# Patient Record
Sex: Female | Born: 1940 | Race: White | Hispanic: No | Smoking: Never smoker
Health system: Southern US, Community
[De-identification: ages and names within clinical notes are randomized; demographics above are authoritative.]

## PROBLEM LIST (undated history)

## (undated) DIAGNOSIS — I38 Endocarditis, valve unspecified: Secondary | ICD-10-CM

## (undated) DIAGNOSIS — E161 Other hypoglycemia: Secondary | ICD-10-CM

## (undated) DIAGNOSIS — M1990 Unspecified osteoarthritis, unspecified site: Secondary | ICD-10-CM

## (undated) DIAGNOSIS — E079 Disorder of thyroid, unspecified: Secondary | ICD-10-CM

## (undated) DIAGNOSIS — M8580 Other specified disorders of bone density and structure, unspecified site: Secondary | ICD-10-CM

## (undated) DIAGNOSIS — F419 Anxiety disorder, unspecified: Secondary | ICD-10-CM

## (undated) HISTORY — PX: COLONOSCOPY: SHX174

## (undated) HISTORY — DX: Other specified disorders of bone density and structure, unspecified site: M85.80

## (undated) HISTORY — DX: Anxiety disorder, unspecified: F41.9

## (undated) HISTORY — PX: BIOPSY BREAST: PRO8

## (undated) HISTORY — PX: UPPER GASTROINTESTINAL ENDOSCOPY: SHX188

## (undated) HISTORY — PX: OOPHORECTOMY: SHX86

---

## 1998-07-05 ENCOUNTER — Other Ambulatory Visit: Admission: RE | Admit: 1998-07-05 | Discharge: 1998-07-05 | Payer: Self-pay | Admitting: Obstetrics and Gynecology

## 1999-06-20 ENCOUNTER — Other Ambulatory Visit: Admission: RE | Admit: 1999-06-20 | Discharge: 1999-06-20 | Payer: Self-pay | Admitting: Obstetrics and Gynecology

## 2000-06-03 ENCOUNTER — Other Ambulatory Visit: Admission: RE | Admit: 2000-06-03 | Discharge: 2000-06-03 | Payer: Self-pay | Admitting: Obstetrics and Gynecology

## 2001-06-05 ENCOUNTER — Other Ambulatory Visit: Admission: RE | Admit: 2001-06-05 | Discharge: 2001-06-05 | Payer: Self-pay | Admitting: Obstetrics and Gynecology

## 2002-07-07 ENCOUNTER — Other Ambulatory Visit: Admission: RE | Admit: 2002-07-07 | Discharge: 2002-07-07 | Payer: Self-pay | Admitting: Obstetrics and Gynecology

## 2002-08-10 ENCOUNTER — Encounter: Admission: RE | Admit: 2002-08-10 | Discharge: 2002-08-10 | Payer: Self-pay | Admitting: *Deleted

## 2003-07-20 ENCOUNTER — Other Ambulatory Visit: Admission: RE | Admit: 2003-07-20 | Discharge: 2003-07-20 | Payer: Self-pay | Admitting: Obstetrics and Gynecology

## 2007-10-22 ENCOUNTER — Emergency Department (HOSPITAL_COMMUNITY): Admission: EM | Admit: 2007-10-22 | Discharge: 2007-10-22 | Payer: Self-pay | Admitting: Emergency Medicine

## 2007-12-16 ENCOUNTER — Ambulatory Visit: Payer: Self-pay | Admitting: Sports Medicine

## 2007-12-16 ENCOUNTER — Telehealth (INDEPENDENT_AMBULATORY_CARE_PROVIDER_SITE_OTHER): Payer: Self-pay | Admitting: *Deleted

## 2007-12-16 DIAGNOSIS — M21619 Bunion of unspecified foot: Secondary | ICD-10-CM

## 2007-12-16 DIAGNOSIS — M79609 Pain in unspecified limb: Secondary | ICD-10-CM

## 2008-04-19 ENCOUNTER — Telehealth (INDEPENDENT_AMBULATORY_CARE_PROVIDER_SITE_OTHER): Payer: Self-pay | Admitting: *Deleted

## 2008-04-20 ENCOUNTER — Ambulatory Visit: Payer: Self-pay | Admitting: Sports Medicine

## 2008-04-20 DIAGNOSIS — M5126 Other intervertebral disc displacement, lumbar region: Secondary | ICD-10-CM

## 2008-04-27 ENCOUNTER — Telehealth: Payer: Self-pay | Admitting: *Deleted

## 2008-04-27 ENCOUNTER — Ambulatory Visit: Payer: Self-pay | Admitting: Family Medicine

## 2008-05-03 ENCOUNTER — Telehealth: Payer: Self-pay | Admitting: Sports Medicine

## 2008-05-06 ENCOUNTER — Telehealth (INDEPENDENT_AMBULATORY_CARE_PROVIDER_SITE_OTHER): Payer: Self-pay | Admitting: *Deleted

## 2008-05-07 ENCOUNTER — Encounter: Admission: RE | Admit: 2008-05-07 | Discharge: 2008-05-07 | Payer: Self-pay | Admitting: Sports Medicine

## 2008-05-10 ENCOUNTER — Telehealth: Payer: Self-pay | Admitting: Sports Medicine

## 2008-05-12 ENCOUNTER — Ambulatory Visit: Payer: Self-pay | Admitting: Family Medicine

## 2008-05-12 DIAGNOSIS — M479 Spondylosis, unspecified: Secondary | ICD-10-CM | POA: Insufficient documentation

## 2008-05-12 DIAGNOSIS — M5137 Other intervertebral disc degeneration, lumbosacral region: Secondary | ICD-10-CM

## 2008-05-12 DIAGNOSIS — M545 Low back pain: Secondary | ICD-10-CM

## 2008-05-14 ENCOUNTER — Telehealth: Payer: Self-pay | Admitting: *Deleted

## 2008-05-17 ENCOUNTER — Ambulatory Visit: Payer: Self-pay | Admitting: Family Medicine

## 2008-05-17 DIAGNOSIS — K5900 Constipation, unspecified: Secondary | ICD-10-CM | POA: Insufficient documentation

## 2008-05-20 ENCOUNTER — Ambulatory Visit: Payer: Self-pay | Admitting: Family Medicine

## 2008-05-20 ENCOUNTER — Telehealth: Payer: Self-pay | Admitting: *Deleted

## 2008-05-21 ENCOUNTER — Telehealth: Payer: Self-pay | Admitting: Family Medicine

## 2008-05-24 ENCOUNTER — Telehealth: Payer: Self-pay | Admitting: Sports Medicine

## 2008-05-25 ENCOUNTER — Encounter (INDEPENDENT_AMBULATORY_CARE_PROVIDER_SITE_OTHER): Payer: Self-pay | Admitting: *Deleted

## 2008-05-26 ENCOUNTER — Telehealth (INDEPENDENT_AMBULATORY_CARE_PROVIDER_SITE_OTHER): Payer: Self-pay | Admitting: *Deleted

## 2008-05-28 ENCOUNTER — Encounter: Admission: RE | Admit: 2008-05-28 | Discharge: 2008-05-28 | Payer: Self-pay | Admitting: Sports Medicine

## 2008-06-02 ENCOUNTER — Telehealth: Payer: Self-pay | Admitting: Sports Medicine

## 2008-06-02 ENCOUNTER — Ambulatory Visit: Payer: Self-pay | Admitting: Sports Medicine

## 2008-06-23 ENCOUNTER — Ambulatory Visit: Payer: Self-pay | Admitting: Sports Medicine

## 2008-06-23 ENCOUNTER — Telehealth (INDEPENDENT_AMBULATORY_CARE_PROVIDER_SITE_OTHER): Payer: Self-pay | Admitting: *Deleted

## 2008-07-23 ENCOUNTER — Ambulatory Visit: Payer: Self-pay | Admitting: Sports Medicine

## 2008-07-23 ENCOUNTER — Ambulatory Visit: Payer: Self-pay | Admitting: Family Medicine

## 2009-09-25 ENCOUNTER — Inpatient Hospital Stay (HOSPITAL_COMMUNITY): Admission: EM | Admit: 2009-09-25 | Discharge: 2009-09-25 | Payer: Self-pay | Admitting: Emergency Medicine

## 2009-10-20 ENCOUNTER — Ambulatory Visit (HOSPITAL_COMMUNITY): Admission: RE | Admit: 2009-10-20 | Discharge: 2009-10-20 | Payer: Self-pay | Admitting: Internal Medicine

## 2009-10-24 ENCOUNTER — Encounter (HOSPITAL_COMMUNITY): Admission: RE | Admit: 2009-10-24 | Discharge: 2009-12-26 | Payer: Self-pay | Admitting: Internal Medicine

## 2009-12-28 ENCOUNTER — Ambulatory Visit: Payer: Self-pay | Admitting: "Endocrinology

## 2010-06-27 ENCOUNTER — Ambulatory Visit: Payer: Self-pay | Admitting: "Endocrinology

## 2010-10-05 ENCOUNTER — Ambulatory Visit: Payer: Self-pay | Admitting: "Endocrinology

## 2010-12-21 ENCOUNTER — Ambulatory Visit: Payer: Self-pay | Admitting: "Endocrinology

## 2011-01-17 ENCOUNTER — Ambulatory Visit (INDEPENDENT_AMBULATORY_CARE_PROVIDER_SITE_OTHER): Payer: Medicare Other | Admitting: "Endocrinology

## 2011-01-17 DIAGNOSIS — R1013 Epigastric pain: Secondary | ICD-10-CM

## 2011-01-17 DIAGNOSIS — G1221 Amyotrophic lateral sclerosis: Secondary | ICD-10-CM

## 2011-01-17 DIAGNOSIS — E0500 Thyrotoxicosis with diffuse goiter without thyrotoxic crisis or storm: Secondary | ICD-10-CM

## 2011-01-17 DIAGNOSIS — E890 Postprocedural hypothyroidism: Secondary | ICD-10-CM

## 2011-01-23 LAB — BASIC METABOLIC PANEL
BUN: 14 mg/dL (ref 6–23)
Calcium: 9.4 mg/dL (ref 8.4–10.5)
GFR calc non Af Amer: >60 mL/min (ref >60)
Potassium: 3.7 mEq/L (ref 3.5–5.1)

## 2011-01-23 LAB — POCT CARDIAC MARKERS: Myoglobin, poc: 24.9 ng/mL (ref 12–200)

## 2011-01-23 LAB — TSH: TSH: 0.017 u[IU]/mL — ABNORMAL LOW (ref 0.350–4.500)

## 2011-01-23 LAB — CBC
HCT: 39.3 % (ref 36.0–46.0)
Hemoglobin: 13.6 g/dL (ref 12.0–15.0)
MCHC: 34.6 g/dL (ref 30.0–36.0)
MCV: 99.1 fL (ref 78.0–100.0)

## 2011-01-23 LAB — DIFFERENTIAL
Eosinophils Relative: 1 % (ref 0–5)
Monocytes Absolute: 0.8 10*3/uL (ref 0.1–1.0)

## 2011-01-23 LAB — CARDIAC PANEL(CRET KIN+CKTOT+MB+TROPI)
Relative Index: INVALID (ref 0.0–2.5)
Troponin I: 0.01 ng/mL (ref 0.00–0.06)

## 2011-01-23 LAB — LIPID PANEL: HDL: 51 mg/dL (ref >39)

## 2011-01-23 LAB — ALT: ALT: 21 U/L (ref 0–35)

## 2011-02-01 ENCOUNTER — Encounter: Payer: Self-pay | Admitting: Sports Medicine

## 2011-02-01 ENCOUNTER — Ambulatory Visit (INDEPENDENT_AMBULATORY_CARE_PROVIDER_SITE_OTHER): Payer: Medicare Other | Admitting: Sports Medicine

## 2011-02-01 DIAGNOSIS — M5412 Radiculopathy, cervical region: Secondary | ICD-10-CM

## 2011-02-01 DIAGNOSIS — M5410 Radiculopathy, site unspecified: Secondary | ICD-10-CM | POA: Insufficient documentation

## 2011-02-01 DIAGNOSIS — M79601 Pain in right arm: Secondary | ICD-10-CM | POA: Insufficient documentation

## 2011-02-01 DIAGNOSIS — M79609 Pain in unspecified limb: Secondary | ICD-10-CM

## 2011-02-01 MED ORDER — AMITRIPTYLINE HCL 10 MG PO TABS: 10.0000 mg | ORAL_TABLET | Freq: Every day | ORAL | Status: DC

## 2011-02-01 NOTE — Patient Instructions (Signed)
You have a stretch neuropraxia of the ulnar nerve starting at level of triceps muscle probably caused by yoga Easy triceps work may help get rid of any muscle spasm- use 1 lb weight or bottle do 3 sets of 10 Do overhead and kickback positions  Start Vitamin B6 100 mg daily Use heating pad or warm compress to wrap right upper arm three times per day for 10 minutes Try something containing menthol (Bengay or flexall 454) on right upper arm  Ok to do yoga, but do not do positions where you support your full body weight on arms/hands Try to avoid letting your right arm dangle

## 2011-02-01 NOTE — Assessment & Plan Note (Signed)
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  I recommended vitamin B6. Some easy triceps exercises. Trial of amitriptyline 10-20 mg at nighttime which should also help since she has a poor sleep pattern.  She was advised regarding side effects but can continue to use the Klonopin as long as this is not too sedating in combination. I would like to recheck this in about 3 weeks. If not responding she could be a candidate for gabapentin.

## 2011-02-01 NOTE — Progress Notes (Signed)
  Subjective:    Patient ID: Glenda Perez, female    DOB: 10-12-1941, 70 y.o.   MRN: [id number]  HPI  Pt presents to clinic for evaluation of rt upper arm pain and rt hand tingling.  She describes the upper arm pain as tenderness that she is aware of all the time.  She does not recall any injury.  Does participate in yoga 4-5 times per week.  Rt hand started tingling 1 month ago when she was working on her taxes and holding a pencil tightly.  Fingers 3-5 tingle the most on rt hand.  Tingling worse when hand is dangling.  No weakness.  Hx of carpal tunnel which was treated with braces.  Has started sleeping in carpal tunnel braces again.  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 Has elevated Free thyroxine index since last visit, started on synthroid.    Review of Systems Patient has a history of herniated lumbar disc with radiculopathy which did resolve with conservative care.    Objective:   Physical Exam     Full ROM of neck, some increase in pain with lateral bend to right Slight pain in right arm with compression testing of neck to the left No weakness C5-T1 testing Normal biceps reflex good bilat Triceps reflex decreased bilat Protraction of rt scapula and slight winging Not much change with abduction Compression at Erb's point does not create Tinel's   Mild tenderness on compression of the triceps muscle. Negative Tinel's at the elbow and at the wrist. Phalen's test negative. Increased sensitivity over the fourth and fifth fingers of the right hand.    Assessment & Plan:

## 2011-02-19 ENCOUNTER — Ambulatory Visit (INDEPENDENT_AMBULATORY_CARE_PROVIDER_SITE_OTHER): Payer: Medicare Other | Admitting: Sports Medicine

## 2011-02-19 ENCOUNTER — Encounter: Payer: Self-pay | Admitting: Sports Medicine

## 2011-02-19 VITALS — BP 129/82

## 2011-02-19 DIAGNOSIS — M79601 Pain in right arm: Secondary | ICD-10-CM

## 2011-02-19 DIAGNOSIS — M5412 Radiculopathy, cervical region: Secondary | ICD-10-CM

## 2011-02-19 DIAGNOSIS — M5410 Radiculopathy, site unspecified: Secondary | ICD-10-CM

## 2011-02-19 DIAGNOSIS — M79609 Pain in unspecified limb: Secondary | ICD-10-CM

## 2011-02-19 NOTE — Progress Notes (Signed)
  Subjective:    Patient ID: Glenda Perez, female    DOB: 02-02-1941, 70 y.o.   MRN: [id number]  HPI  Pt presents to clinic for f/u of rt upper arm pain and tingling which she feels is improving.  States she has decreased tingling sensation, much less tenderness.  Continuing yoga- without significant problems.  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  She has not had prior neck problems but has had some lumbar disc issues with sciatica in the past. She does have a history of carpal tunnel, right but this seems different.   Review of Systems     Objective:   Physical Exam    no acute distress  Ocular changes consistent with graves disease  All strength tests normal c5-t1 Excellent strength on ER, IR, empty can, abduction Neck ROM normal Slightly tighter on lateral neck bend to rt more than lt   No scapular winging with abduction and adduction Slight decrease triceps jerk on rt  Rt triceps much less tender today Augmented phalen test neg    Assessment & Plan:

## 2011-02-19 NOTE — Assessment & Plan Note (Signed)
This is much less than the last visit. I do not detect any spasm over the triceps or the trapezius today. I want her to lessen her use of the sling. She should keep up the triceps exercises and do some range of motion exercises to loosen the trapezius.  Our plan to recheck in about 6 weeks

## 2011-02-19 NOTE — Assessment & Plan Note (Signed)
This seems to be resolving fairly quickly. With this in mind I kept her on vitamin B6. I would also like her to continue the amitriptyline for at least 4 more weeks. She had numerous questions and she plans to travel. I answered these but encouraged her to return to more normal activity particularly with the use of the arm. Her primary limitation is to avoid any motions that are creating a significant increase in pain down the back of the right arm. Otherwise I am okay with her doing yoga poses and walking and other exercises that we arm heals.

## 2011-02-19 NOTE — Patient Instructions (Addendum)
Continue on amitriptyline for the next month If you are going to take [place] on your trip, skip the amitrptyline Try moving your keyboard to your lap or put a pillow in your seat when you are having arm pain and tingling while using the computer Ok to continue any yoga positions that do not aggravate your symptoms Take therma wraps with you on your trip to [place] and [place] - use these at night Start easily swinging your right arm when you are walking on the treadmill -since you are seeing improvement now

## 2011-04-02 ENCOUNTER — Encounter: Payer: Self-pay | Admitting: Pediatrics

## 2011-04-02 DIAGNOSIS — E0500 Thyrotoxicosis with diffuse goiter without thyrotoxic crisis or storm: Secondary | ICD-10-CM

## 2011-04-03 ENCOUNTER — Ambulatory Visit (INDEPENDENT_AMBULATORY_CARE_PROVIDER_SITE_OTHER): Payer: Medicare Other | Admitting: Family Medicine

## 2011-04-03 ENCOUNTER — Ambulatory Visit
Admission: RE | Admit: 2011-04-03 | Discharge: 2011-04-03 | Disposition: A | Discharge status: Home / Self Care | Payer: Medicare Other | Source: Ambulatory Visit | Attending: Family Medicine | Admitting: Family Medicine

## 2011-04-03 ENCOUNTER — Encounter: Payer: Self-pay | Admitting: Family Medicine

## 2011-04-03 VITALS — BP 118/73

## 2011-04-03 DIAGNOSIS — M5410 Radiculopathy, site unspecified: Secondary | ICD-10-CM

## 2011-04-03 DIAGNOSIS — M79601 Pain in right arm: Secondary | ICD-10-CM

## 2011-04-03 DIAGNOSIS — M5412 Radiculopathy, cervical region: Secondary | ICD-10-CM

## 2011-04-03 DIAGNOSIS — M79609 Pain in unspecified limb: Secondary | ICD-10-CM

## 2011-04-03 NOTE — Progress Notes (Signed)
  Subjective:    Patient ID: Glenda Perez, female    DOB: 1941/02/11, 70 y.o.   MRN: [id number]  HPI 70 year old right-hand-dominant female to office for followup of right upper extremity radiculopathy. Symptoms are relatively unchanged since last visit, he continues to have tingling into her hand, but now is experiencing increased pain along her right shoulder and into her right trapezius. She has been taking amitriptyline at bedtime along with vitamin B6 and has been tolerating these well. Is sleeping well at night. Denies any upper extremity weakness. Was on a recent trip to [place] and was having increasing pain in her upper arm as well as increasing numbness over the dorsum of her right hand. She was evaluated by a physical therapist on the triple he found no focal neurologic findings. She has been continuing to avoid yoga. No previous neck imaging or nerve conduction studies for this problem.  She has not had prior neck problems but has had some lumbar disc issues with sciatica in the past. She does have a history of carpal tunnel, right but this seems different.   Review of Systems Per history of present illness, otherwise negative    Objective:   Physical Exam GENERAL: alert and oriented x3, in no acute distress, pleasant SKIN: No rashes or lesions MSK: - C-spine: Good range of motion in all planes , does have increased pain into her right shoulder with rightward sidebending and rotation. Spurling's on right does reproduce pain into her right shoulder, no radicular symptoms down her arm, negative Spurling's on the left. - Shoulders: Full range of motion bilaterally without pain, normal rotator cuff strength, negative impingement testing. - Elbows: Full range of motion bilaterally without pain. No tenderness over medial or lateral epicondyles or over her olecranon. Negative Tinel's and cubital, bilaterally. - Forearm/hand/wrist: Normal range of motion without pain. Normal grip  strength and normal intrinsic hand strength. Negative Tinel's over the carpal tunnel bilaterally. Negative augmented Phalen's bilaterally. NEURO: Sensation intact to light touch upper tremor noted bilaterally. DTR +1/4 right tricep, otherwise +2/4 left tricep and biceps, brachial radialis bilaterally. Normal strength C5-T1 bilaterally. VASCULAR: Pulses +2/4 radial artery bilaterally.      Assessment & Plan:

## 2011-04-03 NOTE — Assessment & Plan Note (Signed)
Right arm pain has progressed since last visit now having increased pain over the right shoulder and right triceps concerning for possible cervical radiculopathy. No spasm appreciated on exam today. - Continue amitriptyline and B6 as stated above. - Check cervical spine x-rays, call with results and discuss further treatment options at that time.

## 2011-04-03 NOTE — Assessment & Plan Note (Signed)
Right upper extremity radiculopathy with increasing pain in her right shoulder and right tricep. Symptoms relatively unchanged from previous evaluation. With increasing symptoms in her shoulder and tricep question whether nerve impingement is coming from the neck versus more distal in the arm. - Will check cervical spine films to evaluate for arthritis. - Discussed increasing amitriptyline dose, but patient was not interested. Should continue with this at current dose. - Continue with vitamin B6 as she is doing. - Will call patient with x-ray results at [phone](H) or [phone](C). Based on x-ray findings may consider MRI of cervical spine versus nerve conduction studies to further delineate cause of her symptoms. We'll discuss followup at that time.

## 2011-04-05 ENCOUNTER — Telehealth: Payer: Self-pay | Admitting: Family Medicine

## 2011-04-05 MED ORDER — PREDNISONE (PAK) 10 MG PO TABS: ORAL_TABLET | ORAL | Status: DC

## 2011-04-05 NOTE — Telephone Encounter (Signed)
Call patient 04/05/11 at 14:05 to discuss recent cervical spine x-rays. No answer on her home phone, cell phone was called and message left indicating x-ray showing significant arthritis. Would like to try to discuss findings with her so we can explore further treatment options. I will try to reach her later tomorrow.  Patient called back, spoke to her on the phone at 14:30. 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 Discussed possible treatment options and patient would like to do give trial of prednisone Dosepak. She was not interested in increasing her amitriptyline at this time. Discussed possibility of MRI in the future, but she would like to wait and discuss this in the future. She will be traveling to [place] next week and plans to start prednisone after that. She will followup with [place] one to 2 weeks after completing prednisone and assess her progress. She will call with any questions or concerns in the interim.

## 2011-04-18 ENCOUNTER — Other Ambulatory Visit: Payer: Self-pay | Admitting: *Deleted

## 2011-04-18 DIAGNOSIS — E0500 Thyrotoxicosis with diffuse goiter without thyrotoxic crisis or storm: Secondary | ICD-10-CM

## 2011-04-23 ENCOUNTER — Other Ambulatory Visit: Payer: Self-pay | Admitting: "Endocrinology

## 2011-04-24 LAB — T3, FREE: T3, Free: 2.2 pg/mL — ABNORMAL LOW (ref 2.3–4.2)

## 2011-04-24 LAB — TSH: TSH: 1.736 u[IU]/mL (ref 0.350–4.500)

## 2011-04-26 ENCOUNTER — Ambulatory Visit (INDEPENDENT_AMBULATORY_CARE_PROVIDER_SITE_OTHER): Payer: Medicare Other | Admitting: Sports Medicine

## 2011-04-26 VITALS — BP 105/67

## 2011-04-26 DIAGNOSIS — M5412 Radiculopathy, cervical region: Secondary | ICD-10-CM

## 2011-04-26 DIAGNOSIS — M5410 Radiculopathy, site unspecified: Secondary | ICD-10-CM

## 2011-04-26 DIAGNOSIS — M47812 Spondylosis without myelopathy or radiculopathy, cervical region: Secondary | ICD-10-CM | POA: Insufficient documentation

## 2011-04-26 LAB — THYROID STIMULATING IMMUNOGLOBULIN: TSI: 347 % baseline — ABNORMAL HIGH (ref <140)

## 2011-04-26 NOTE — Assessment & Plan Note (Signed)
Plan the patient has significant degenerative disc disease of the neck it is not clear that this actually triggered her radiculopathy although I think it may have made this more likely. She is clearly improving and gradually weaned from the amitriptyline. She can also gradually increase upper extremity exercises but she does in her yoga classes   she will continue on vitamin B6  She can return to see me in 2 months but can cancel this if all symptoms have resolved

## 2011-04-26 NOTE — Assessment & Plan Note (Signed)
Continue to maintain good neck range of motion and good posture

## 2011-04-26 NOTE — Patient Instructions (Addendum)
Continue on vitamin B6 daily Start taking muscle relaxer every other night for 8 days.  If you do well with reducing this, it is ok to stop the medication It is ok to try a few simple yoga poses using your hands, and use bottles to do easy arm exercises and wall push ups at home for 2 weeks- if this does not cause pain and tingling - then it is ok for your to return to using your arms in yoga  If this is not bothering you in 2 months- you do not need to follow up.  Please let us know if you have any problems.   Glad you are doing well!

## 2011-04-26 NOTE — Progress Notes (Signed)
  Subjective:    Patient ID: Glenda Perez, female    DOB: May 13, 1941, 70 y.o.   MRN: [id number]  HPI  Pt presents to clinic for f/u of rt radiculopathy.  States she is much improved since completing prednisone burst.  She is now experiencing no tingling since the end of June.  Did a 6 day course of prednisone started on 04/03/11 and noticed that the tingling had subsided the last week of June.  Patient continues on b6, but has not been doing the overhead strengthening exercises because it was causing pain.     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 She has anterior spurring at the lower 3 levels of the cervical spine. She has some foraminal narrowing and some spurring on the left than the right but on both sides from C4-C7.  Assessment & Plan:

## 2011-05-02 ENCOUNTER — Ambulatory Visit (INDEPENDENT_AMBULATORY_CARE_PROVIDER_SITE_OTHER): Payer: Medicare Other | Admitting: "Endocrinology

## 2011-05-02 VITALS — BP 100/51 | HR 67 | Wt 105.0 lb

## 2011-05-02 DIAGNOSIS — E049 Nontoxic goiter, unspecified: Secondary | ICD-10-CM

## 2011-05-02 DIAGNOSIS — M899 Disorder of bone, unspecified: Secondary | ICD-10-CM

## 2011-05-02 DIAGNOSIS — H05243 Constant exophthalmos, bilateral: Secondary | ICD-10-CM

## 2011-05-02 DIAGNOSIS — M8580 Other specified disorders of bone density and structure, unspecified site: Secondary | ICD-10-CM

## 2011-05-02 DIAGNOSIS — E063 Autoimmune thyroiditis: Secondary | ICD-10-CM

## 2011-05-02 DIAGNOSIS — G479 Sleep disorder, unspecified: Secondary | ICD-10-CM

## 2011-05-02 DIAGNOSIS — H05249 Constant exophthalmos, unspecified eye: Secondary | ICD-10-CM

## 2011-05-02 DIAGNOSIS — E038 Other specified hypothyroidism: Secondary | ICD-10-CM

## 2011-05-02 NOTE — Progress Notes (Addendum)
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 In retrospect she had the onset of Graves' disease approximately 2 years previously. In late 2010 she was losing weight, became dehydrated, and fainted. She was taken to the emergency department where a diagnosis of hyperthyroidism was made. She saw Dr. Talmage Coin in consultation. He arranged for her to have the radioactive iodine ablation on  01.0 4.11. By  3.0 7.11 she felt hypothyroid and her labs indicated that she was hypothyroid. Dr. Sharl Ma started her on Synthroid, 50 mcg per day on 0 3.0 8.11. Because the patient knew me from my previous work with her husband, she called and asked if I would see her as a new patient. I saw the patient for the first time on 3.0 9.11 Her past medical history revealed osteopenia and some chronic sleep problems. Surgical history showed that she had had resection of a benign breast cyst and a benign ovarian cyst. She was allergic to propranolol and to methimazole. Social history showed that she was a widow. Her husband, who was a pediatric cardiologist, had died several years before. The patient is an Tree surgeon. Her PCP is Dr. Theressa Millard. She is a nonsmoker. She prefers to use organic food. Her family history was positive for her mother who had diabetes, hypertension, and heart disease. Her father had atherosclerotic cardiovascular disease. There was no history of thyroid disease in the family. On physical examination she was hypertensive with a blood pressure 153/86. Her heart rate was 74. She looked drained and tired. She did not have  any arcus or proptosis. Thyroid gland was mildly enlarged in the left lobe, with a total thyroid gland size of 20+ grams. Thyroid gland was nontender. She had a trace of tremor in her outstretched hands. She had wasting of the thenar and hypothenar eminences. My assessment was that she had hypothyroidism secondary to radioactive iodine treatment for Graves' disease. She had osteopenia, to which the Graves' disease might have contributed. She needed to continue her calcium and vitamin D intake. She'll also needed to have the bone mineral density study that was already scheduled. In addition, she had muscle atrophy consistent with Graves' disease. She also had some degree of dyspepsia which might have been attributable to Graves' disease, although her Graves' disease appeared to be in remission at that time. I discussed with her the possibility that she might have a recurrence of Graves' disease activity if the thyroid stimulating immunoglobulin level were to increase. If that were to happen, then she might also developed exophthalmos. 2. 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 This began with puffiness of her lower eyelids which she noted in late August and reported to me in early September. At that point her eyelids were more prominent. The conjunctivae of the lower lids were somewhat exposed and were watery. I initially thought this might just be allergic rhinitis and conjunctivitis. However by 11.14.12, the eyelids were even more prominent and her eyes were becoming more prominent as well. At that time we knew that her thyroid stimulating immunoglobulin level from 08/29/10 was high at 545. She contacted me and I told her it seemed as if she were developing chemosis secondary either to  the high TSI level or perhaps some other protein produced by the Graves' disease B lymphocytes. We discussed options for medical treatment, to include starting her on PTU. I told her while PTU is usually a very mild and  benign drug, there have been instances of liver failure and bone marrow failure and septic death associated with both PTU and methimazole. The FDA had already put out a black box warning about the possible liver failure with PTU.Hearing that disclaimer, the patient did not want to begin PT in therapy that time. Since she had already scheduled an appointment with an ophthalmologist at [facility] for December, we decided to see what that specialist would recommend. The patient subsequently asked me to refer her to the [facility] On my exam on 10/06/11, the chemosis was certainly worse. Proptometer readings showed that her eyes were relatively prominent, but not frankly proptotic. On 10/09/10 she saw Dr. Henry Russel at the [facility]. Dr. Kate Sable noted that she did have chemosis, but no proptosis, diplopia, motility restriction, visual field defects, or lid lag. She did have some minimal lid retraction. It was Dr. Lonia Mad belief that the patient did not need surgery. Dr.  Kate Sable performed bilateral Kenalog injections into the orbits. At that point the TSI result from 10/06/2010 became available.The TSI value had decreased to 443. By the time of her next visit on 01/17/11, she was on Synthroid 75 mcg, 6 days per week. Her eyes were better, but still watered a lot the mornings. On examination her eyes were less prominent. The eyelids covered the tops of both corneas. The left lower eyelid covered 100% of the bottom of the left cornea. The right lower lid covered about 80% of the bottom of the right cornea. The soft tissue behind the lower lids was less swollen. Her TSI value increased up to 552 on 12/22/10, but subsequently came down to 359 on 02/20/2011 and 347 on 04/23/11. 3. Her last [facility] visit was on 01/17/11. In the interim, she reduced her calcium intake due to the media reports that stated that the Institute of medicine had determined that too many adults take too  much calcium. I asked her to resume her calcium intake. I said that while the Institute of Medicine reports may address population issues in general, no one should ever take one of the Institute of Medicine reports and attempt to apply it to every individual.  4. Constitutional: The patient feels "fine",  Has "lots of energy", and is sleeping better on melatonin. Eyes: She has occasional double vision when she is looking at objects far in the distance. Her eyes water more in the mornings. Neck: The patient has no complaints of anterior neck swelling, soreness, tenderness,  pressure, discomfort, or difficulty swallowing.  Heart: Heart rate increases with exercise or other physical activity. She occasionally had a feeling of chest tightness when she feels stressed. She has not reported this to Dr. Earl Gala as yet. The patient has no complaints of palpitations, irregular heat beats, or chest pain. Gastrointestinal: Bowel movents seem normal. The patient has no complaints of excessive hunger, acid reflux, upset stomach, stomach aches or pains, diarrhea, or constipation. Legs: Muscle mass and strength seem normal. There are no complaints of numbness, tingling, burning, or pain. No edema is noted. Feet: There are no obvious foot problems. There are no complaints of numbness, tingling, burning, or pain. No edema is noted.  PMFSH: 1. She is going on a trip to  [place] this weekend to see her brother.  2. She will soon travel to [place] to see her daughter and grandchild.  ROS: There are no other significant problems involving her other six body systems.  PHYSICAL EXAM: BP 100/51  Pulse 67  Wt 105 lb (47.628 kg) Constitutional: The patient looks healthy and appears physically and emotionally well.  Eyes: There is no arcus. Her eyes looked more proptotic to me. Her extraocular range of motion seemed somewhat decreased in both eyes, the left being worse than the right. Mouth: The oropharynx appears  normal. The tongue appears normal. There is normal oral moisture. There is no obvious gingivitis. Neck: There are no bruits present. The thyroid gland appears mildly enlarged. The thyroid gland is approximately 20 + grams in size. The consistency of the thyroid gland is relatively firm. There is no thyroid tenderness to palpation. Lungs: The lungs are clear. Air movement is good. Heart: The heart rhythm and rate appear normal. Heart sounds S1 and S2 are normal. I do not appreciate any pathologic heart murmurs. Abdomen: The abdominal size is normal. Bowel sounds are normal. The abdomen is soft and non-tender. There is no obviously palpable hepatomegaly, splenomegaly, or other masses.  Arms: Muscle mass appears appropriate for age. Radial pulses appear normal. Hands: There is no obvious tremor. Phalangeal and metacarpophalangeal joints appear normal. Palms are normal. Legs: Muscle mass appears appropriate for age. There is no edema.  Neurologic: Muscle strength is normal for age and gender  in both the upper and the lower extremities. Muscle tone appears normal. Sensation to touch is normal in the legs and feet.  Laboratory data: 04/23/11  ASSESSMENT: 1. Hypothyroid: The patient was euthyroid as of her labs earlier this month. 2. Exophthalmos: This problem is slightly worse on this visit. She still is too much TSI on board. If her TSI remains at this level or increases, will likely need to begin PTU.  3. Goiter: Thyroid gland is smaller today. 4. Sleep problems: She is doing better since we have reduced her thyroid hormone dose. 5. Osteopenia: I've asked her to resume her calcium intake. 6. Chest pressure: Asked the patient to contact Dr. Earl Gala when she returns from her visit to [place]. It is important this problem be followed up.  PLAN: 1. Diagnostic: In 2-1/2 months we'll obtain another set of thyroid function tests, TSI, calcium, PTH, and vitamin D levels. 2. Therapeutic: We'll  continue her current dose of Synthroid as planned. Will consider adding PTU to her regimen. 3. Patient education: We discussed the fact that women often do not show the classic signs of angina that men typically do show. It is important that she calls Dr. Earl Gala and follows up with him. 4. Follow-up: Next appointment will be in 3 months. Please also followup with Dr. Emily Filbert in reference to the double vision.   Level of Service: This visit lasted in excess of 40 minutes. More than 50% of the visit was devoted to counseling.

## 2011-05-02 NOTE — Patient Instructions (Signed)
Please have calcium and vitamin d lab tests done within the next 2-3 weeks. Please have thyroid tests done one week prior to next appointment.

## 2011-06-08 ENCOUNTER — Other Ambulatory Visit: Payer: Self-pay | Admitting: "Endocrinology

## 2011-06-11 LAB — PTH, INTACT AND CALCIUM: PTH: 41.1 pg/mL (ref 14.0–72.0)

## 2011-07-18 ENCOUNTER — Other Ambulatory Visit: Payer: Self-pay | Admitting: *Deleted

## 2011-07-18 DIAGNOSIS — E038 Other specified hypothyroidism: Secondary | ICD-10-CM

## 2011-07-27 LAB — CBC
HCT: 41
Hemoglobin: 13.8
MCV: 96.4
RBC: 4.25
WBC: 4

## 2011-07-27 LAB — I-STAT 8, (EC8 V) (CONVERTED LAB)
Acid-Base Excess: 5 — ABNORMAL HIGH
BUN: 12
Chloride: 101
Glucose, Bld: 74
Potassium: 3.8
pCO2, Ven: 59.1 — ABNORMAL HIGH
pH, Ven: 7.347 — ABNORMAL HIGH

## 2011-07-27 LAB — POCT CARDIAC MARKERS
Myoglobin, poc: 22.9
Operator id: 198171

## 2011-07-27 LAB — POCT I-STAT CREATININE: Operator id: 198171

## 2011-08-07 LAB — COMPLETE METABOLIC PANEL WITH GFR
Alkaline Phosphatase: 47 U/L (ref 39–117)
BUN: 11 mg/dL (ref 6–23)
Creat: 0.64 mg/dL (ref 0.50–1.10)
GFR, Est Non African American: >90 mL/min (ref >90)
Glucose, Bld: 85 mg/dL (ref 70–99)
Sodium: 138 mEq/L (ref 135–145)
Total Bilirubin: 0.6 mg/dL (ref 0.3–1.2)
Total Protein: 7.4 g/dL (ref 6.0–8.3)

## 2011-08-07 LAB — CBC WITH DIFFERENTIAL/PLATELET
HCT: 40.5 % (ref 36.0–46.0)
Hemoglobin: 13.4 g/dL (ref 12.0–15.0)
Lymphocytes Relative: 23 % (ref 12–46)
Lymphs Abs: 1.4 10*3/uL (ref 0.7–4.0)
Monocytes Absolute: 0.5 10*3/uL (ref 0.1–1.0)
Monocytes Relative: 8 % (ref 3–12)
Neutro Abs: 4 10*3/uL (ref 1.7–7.7)
Neutrophils Relative %: 67 % (ref 43–77)
RBC: 4.09 MIL/uL (ref 3.87–5.11)
WBC: 5.9 10*3/uL (ref 4.0–10.5)

## 2011-08-07 LAB — TSH: TSH: 5.378 u[IU]/mL — ABNORMAL HIGH (ref 0.350–4.500)

## 2011-08-17 ENCOUNTER — Other Ambulatory Visit: Payer: Self-pay | Admitting: *Deleted

## 2011-08-17 DIAGNOSIS — E0590 Thyrotoxicosis, unspecified without thyrotoxic crisis or storm: Secondary | ICD-10-CM

## 2011-08-21 ENCOUNTER — Ambulatory Visit: Payer: Medicare Other | Admitting: "Endocrinology

## 2011-08-29 ENCOUNTER — Other Ambulatory Visit: Payer: Self-pay | Admitting: *Deleted

## 2011-08-29 DIAGNOSIS — E0500 Thyrotoxicosis with diffuse goiter without thyrotoxic crisis or storm: Secondary | ICD-10-CM

## 2011-09-11 LAB — COMPREHENSIVE METABOLIC PANEL
BUN: 12 mg/dL (ref 6–23)
CO2: 29 mEq/L (ref 19–32)
Calcium: 9.8 mg/dL (ref 8.4–10.5)
Chloride: 98 mEq/L (ref 96–112)
Creat: 0.71 mg/dL (ref 0.50–1.10)
Total Bilirubin: 0.5 mg/dL (ref 0.3–1.2)

## 2011-09-12 LAB — CBC
HCT: 41.2 % (ref 36.0–46.0)
MCH: 32.9 pg (ref 26.0–34.0)
MCV: 98.3 fL (ref 78.0–100.0)
RDW: 14.7 % (ref 11.5–15.5)
WBC: 5.9 10*3/uL (ref 4.0–10.5)

## 2011-09-12 LAB — DIFFERENTIAL
Basophils Absolute: 0 10*3/uL (ref 0.0–0.1)
Eosinophils Absolute: 0.1 10*3/uL (ref 0.0–0.7)
Eosinophils Relative: 1 % (ref 0–5)
Lymphocytes Relative: 26 % (ref 12–46)
Lymphs Abs: 1.5 10*3/uL (ref 0.7–4.0)
Monocytes Absolute: 0.5 10*3/uL (ref 0.1–1.0)

## 2011-09-12 LAB — T4, FREE: Free T4: 1.08 ng/dL (ref 0.80–1.80)

## 2011-09-12 LAB — T3, FREE: T3, Free: 2 pg/mL — ABNORMAL LOW (ref 2.3–4.2)

## 2011-09-14 LAB — THYROID STIMULATING IMMUNOGLOBULIN: TSI: 365 % baseline — ABNORMAL HIGH (ref <140)

## 2011-09-18 ENCOUNTER — Encounter: Payer: Self-pay | Admitting: "Endocrinology

## 2011-09-18 ENCOUNTER — Ambulatory Visit (INDEPENDENT_AMBULATORY_CARE_PROVIDER_SITE_OTHER): Payer: Medicare Other | Admitting: "Endocrinology

## 2011-09-18 VITALS — BP 129/63 | HR 61 | Wt 107.4 lb

## 2011-09-18 DIAGNOSIS — R1013 Epigastric pain: Secondary | ICD-10-CM

## 2011-09-18 DIAGNOSIS — E890 Postprocedural hypothyroidism: Secondary | ICD-10-CM

## 2011-09-18 DIAGNOSIS — E063 Autoimmune thyroiditis: Secondary | ICD-10-CM

## 2011-09-18 DIAGNOSIS — G479 Sleep disorder, unspecified: Secondary | ICD-10-CM

## 2011-09-18 DIAGNOSIS — Z72820 Sleep deprivation: Secondary | ICD-10-CM

## 2011-09-18 DIAGNOSIS — E0500 Thyrotoxicosis with diffuse goiter without thyrotoxic crisis or storm: Secondary | ICD-10-CM

## 2011-09-18 MED ORDER — METHIMAZOLE 5 MG PO TABS: 5.0000 mg | ORAL_TABLET | Freq: Three times a day (TID) | ORAL | Status: DC

## 2011-09-18 NOTE — Progress Notes (Addendum)
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  1. I have followed the patient for the above problems since 03.09.11. Over time I have gradually increased her Synthroid dose from 50 mcg a day, to 75 mcg 6 days a week, to 75 mcg every day as of today. From the perspective of being hyper/hypothyroid, the patient has done quite well. 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 2. 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 At that time she had no evidence of exophthalmos. Unfortunately, the patient did develop exophthalmos over time. This began with puffiness of her lower eyelids which she noted in late August and reported to me in early September. At that point her eyelids were more prominent. The conjunctivae of the lower lids were somewhat exposed and were watery. I initially thought this might just be allergic rhinitis and conjunctivitis. However by 11.14.11, the eyelids were even more prominent and her eyes were becoming more prominent as well. At that time we knew that her thyroid stimulating immunoglobulin level from 08/29/10 was high at 545. She contacted me and I told her it seemed as if she were developing chemosis secondary to either the high TSI level or perhaps some other protein produced by the Graves' disease B lymphocytes. We discussed options for medical treatment, to include starting her on propylthiouracil (PTU) or methimazole (MTZ). I told her while these medications are usually very mild and benign drugs, there have been instances of liver failure, bone marrow failure  and septic death  associated with both PTU and methimazole. The FDA had recently put out a black box warning about possible liver failure with PTU. Hearing that disclaimer, the patient did not want to begin either medication at that time. Since she had already scheduled an appointment with an ophthalmologist at [facility] for December, we decided to see what that specialist would recommend. The patient subsequently asked me to refer her to the [facility]. On my exam on 10/05/10, the chemosis was certainly worse. Proptometer readings showed that her eyes were relatively prominent, but not frankly proptotic. On 10/09/10 she saw Dr. Henry Russel at the [facility]. Dr. Kate Sable noted that she did have chemosis, but no proptosis, diplopia, motility restriction, visual field defects, or lid lag. She did have some minimal lid retraction. It was Dr. Lonia Mad belief that the patient did not need surgery. Dr.  Kate Sable performed bilateral Kenalog injections into the orbits. At that point the TSI result from 10/06/2010 became available.The TSI value had decreased to 443. By the time of her next visit on 01/17/11, she was on Synthroid 75 mcg, 6 days per week. Her eyes were better, but still watered a lot the mornings. On examination her eyes were less prominent. The eyelids covered the tops of both corneas. The left lower eyelid covered 100% of the bottom of the left cornea. The right lower lid covered about 80% of the bottom of the right cornea. The soft tissue behind the lower lids was less swollen. Her TSI value increased up to 552 on 12/22/10, but subsequently came down to 359 on 02/20/2011 and to 347 on 04/23/11. 3. Her last [facility] visit was on  05/02/11. She has been healthy and active. Her current Synthroid dose is 75 mcg 6 days per week. 4. Pertinent Review of systems: Constitutional: The patient feels "fine",  Has "lots of energy". She is sleeping better with the combination of  melatonin and  clonazepan nightly. Eyes: She has frequent double vision when she is looking at objects in the distance. She also has a sensation of visual distortion, not quite double vision, but perhaps a milder overlap of images when she looks at objects 5-10 feet away. For example, when she looked at me a distance of about 8 feet from her this morning, she noted that there seemed to be perhaps 1.2 of me. New glasses have not helped. Her eyes water more in the mornings. Her lower eyelids also seem more swollen in the mornings. She has noted that if she takes in extra salt, the eyelids swelling is even worse. Neck: The patient has no complaints of anterior neck swelling, soreness, tenderness,  pressure, discomfort, or difficulty swallowing.  Heart: Heart rate increases with exercise or other physical activity. She did see Dr. Earl Gala for a previous episode of chest tightness when she felt stressed. Her ECG was reportedly normal. The patient has no complaints of palpitations, irregular heat beats, or chest pain. Gastrointestinal: Bowel movents seem normal. The patient has no complaints of excessive hunger, acid reflux, upset stomach, stomach aches or pains, diarrhea, or constipation. Legs: Muscle mass and strength seem normal. There are no complaints of numbness, tingling, burning, or pain. No edema is noted. Feet: There are no obvious foot problems. There are no complaints of numbness, tingling, burning, or pain. No edema is noted.  PMFSH: 1. Work and family: The patient just hosted her daughter and two grandchildren over the Thanksgiving weekend. The kids kept her very busy. 2. Activities: She is still very active in the community, but is not doing many art projects herself. 3. Tobacco, alcohol, illicit drugs: None 4. Primary care provider: Dr. Benjaman Kindler  ROS: There are no other significant problems involving her other body systems.  PHYSICAL EXAM: BP 129/63  Pulse 61  Wt 107 lb 6.4 oz (48.716  kg) Constitutional: The patient looks healthy and appears physically and emotionally well.  Eyes: There is no arcus. Her eyes are watering a lot. Her eye proptosis looks about the same. Her extraocular range of motion seems somewhat decreased in both eyes, especially when looking up and laterally. Her eye lateral chemosis has not improved and may have worsened. Her right lower lid is not as swollen as the left lower lid. Mouth: The oropharynx appears normal. The tongue appears normal. There is normal oral moisture. There is no obvious gingivitis. Neck: There are no bruits present. The thyroid gland is 20+ grams in size. The right lobe is within normal limits for size. Left lobe is slightly enlarged and is relatively firm. There is no thyroid tenderness to palpation. Lungs: The lungs are clear. Air movement is good. Heart: The heart rhythm and rate appear normal. Heart sounds S1 and S2 are normal. I do not appreciate any pathologic heart murmurs. Abdomen: The abdominal size is normal. Bowel sounds are normal. The abdomen is soft and non-tender. There is no obviously palpable hepatomegaly, splenomegaly, or other masses.  Arms: Muscle mass appears appropriate for age. Radial pulses appear normal. Hands: There is no obvious tremor. Phalangeal and metacarpophalangeal joints appear normal. Palms are normal. Legs: Muscle mass appears appropriate for age. There is no edema.  Neurologic: Muscle strength is  normal for age and gender  in both the upper and the lower extremities. Muscle tone appears normal. Sensation to touch is normal in legs.  Laboratory data: 08/29/11: TSH was 12.759. Free T4 was 1.08. Free T3 was 2.0. TSI was 365.  ASSESSMENT: 1. Hypothyroid: The patient was quite hypothyroid 2 weeks ago.  We need to increase her Synthroid dose. 2. Exophthalmos: This problem is slightly worse on this visit. Her TSI has increased once again. It's been almost two years since she had her I-131 therapy. I have  been hoping that her TSI levels would progressively decline and that her exophthalmos would progressively improve. Unfortunately, that has not been the case. Although it is not an emergency that we begin treatment with methimazole, I want to avoid further progression of her exophthalmos  to the stage of congestive ophthalmopathy.  A. The patient would really like to have medical treatment for her eye disease. We have previously discussed the options of medical treatment, orbital decompression, and radiation of the orbit. I told her that I have been successful with several patients in using either methimazole or PTU for treatment. These thionamide medications usually cause the  TSI levels to be reduced. In the process, the swelling of the extraocular muscles of the eye and the swelling around the orbit usually improve. Since the FDA did put out the black box warning about PTU, I would use methimazole in her case.  B. The patient asked the question if this were my wife in a similar situation would I treat her with a medication like methimazole. I told her that I would recommend methimazole treatment to my wife, but since I can't legally prescribe for my wife, I would try to find a fellow endocrinologist who would be willing to use that therapy.  C. In reviewing her chart tonight, I remembered that when she first came to see me she had told me that when she was started on propranolol and methimazole initially, she developed dizziness. I called her and asked for more detail. She told me that when she was taking the propranolol and methimazole she went to her yoga class. After being in a seated yoga position for some time, she began to stand up and felt dizzy. I told her that this sounded like orthostatic dizziness that was likely due to the propranolol, not the methimazole. I feel comfortable proceeding with methimazole therapy. 3. Goiter: Thyroid gland is a bit smaller today. 4. Sleep problems: She is doing  better. 5.Thyroiditis: The shift of all 3 TFTs upward from July to October is pathognomonic for Hashimoto's disease. 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 Developing HD is actually a good thing, since as the HD gradually destroys her remaining thyrocytes, there will be less thyroid antigen present to stimulate her Graves' Disease B lymphocytes.  6. Visual distortion/Partial diplopia: Since her new glasses do not seem to have helped this problem, and since it is relatively unusual for someone with Graves" Ophthalmopathy to have a central diplopia, it would be worthwhile to schedule a follow-up appointment with Dr. Emily Filbert. She stated that she would like to see if the methimazole treatment would help her first.  PLAN: 1. Diagnostic: In one month we'll obtain another set of thyroid function tests, TSI, CBC with differential, and CMP. 2. Therapeutic: We'll increase her Synthroid dose to 75 mcg daily. This is about a 16% increase in the dose. Will start methimazole at a dose of 5 mg/day. 3. Patient education:  We discussed the possible adverse effects of methimazole. I told her that while methimazole is generally considered to be a safe medication, some patients have developed hepatitis and liver failure, bone marrow suppression, low WBC counts and infection, low RBC counts and anemia, and low platelet counts and hemorrhaging. She is to call me immediately or go to the ED if she has a temperature greater than 100.5 degrees. 4. Follow-up: Next appointment will be in 2 months. Please also follow-up with Dr. Emily Filbert in reference to the double vision if it worsens.  Level of Service: This visit lasted in excess of 40 minutes. More than 50% of the visit was devoted to counseling.  David Stall

## 2011-09-18 NOTE — Patient Instructions (Signed)
Followup visit in 3 months. Please have lab tests done in 1 month. Please take one 5 mg methimazole pill per day. Please call Dr. Fransico Aydden Cumpian immediately if you have a temperature greater than 100.5.

## 2011-10-04 ENCOUNTER — Ambulatory Visit: Payer: Medicare Other | Admitting: "Endocrinology

## 2011-10-18 LAB — COMPREHENSIVE METABOLIC PANEL
CO2: 29 mEq/L (ref 19–32)
Creat: 0.62 mg/dL (ref 0.50–1.10)
Glucose, Bld: 99 mg/dL (ref 70–99)
Sodium: 139 mEq/L (ref 135–145)
Total Bilirubin: 0.5 mg/dL (ref 0.3–1.2)
Total Protein: 7.4 g/dL (ref 6.0–8.3)

## 2011-10-19 ENCOUNTER — Telehealth: Payer: Self-pay | Admitting: "Endocrinology

## 2011-10-19 LAB — CBC WITH DIFFERENTIAL/PLATELET
Eosinophils Absolute: 0 10*3/uL (ref 0.0–0.7)
Eosinophils Relative: 1 % (ref 0–5)
HCT: 43 % (ref 36.0–46.0)
Hemoglobin: 13.3 g/dL (ref 12.0–15.0)
Lymphs Abs: 1.4 10*3/uL (ref 0.7–4.0)
MCH: 32.4 pg (ref 26.0–34.0)
MCV: 104.9 fL — ABNORMAL HIGH (ref 78.0–100.0)
Monocytes Absolute: 0.5 10*3/uL (ref 0.1–1.0)
Monocytes Relative: 10 % (ref 3–12)
RBC: 4.1 MIL/uL (ref 3.87–5.11)

## 2011-10-19 NOTE — Telephone Encounter (Signed)
Please see my voicemail message below.

## 2011-10-22 ENCOUNTER — Telehealth: Payer: Self-pay | Admitting: "Endocrinology

## 2011-10-22 DIAGNOSIS — D7589 Other specified diseases of blood and blood-forming organs: Secondary | ICD-10-CM

## 2011-10-22 DIAGNOSIS — E0581 Other thyrotoxicosis with thyrotoxic crisis or storm: Secondary | ICD-10-CM

## 2011-10-22 NOTE — Telephone Encounter (Signed)
Please see my telephone note below. In addition, the corrected lab value report from 11/27 shows a mildly elevated MCV and RDW.  Her MCV and RDW have been high-normal for some time, but this is the first time to my knowledge that they have been elevated. This combination is most common in folate deficiency, vitamin 12 deficiency, or combined folate-B12 deficiency/low-normal values. Sometimes iron deficiency can be combined as well. It is quite common to have decreased GI absorption of both of these factors with aging. Given the patient's history of autoimmune thyroid disease, it's possible that she could also have pernicious anemia. Will also order a repeat CBC, folate, B12, iron, and intrinsic factor antibodies.

## 2011-11-05 ENCOUNTER — Other Ambulatory Visit: Payer: Self-pay | Admitting: "Endocrinology

## 2011-11-07 LAB — CBC WITH DIFFERENTIAL/PLATELET
Eosinophils Relative: 1 % (ref 0–5)
Lymphocytes Relative: 28 % (ref 12–46)
Lymphs Abs: 1.3 10*3/uL (ref 0.7–4.0)
MCV: 98 fL (ref 78.0–100.0)
Neutrophils Relative %: 62 % (ref 43–77)
Platelets: 230 10*3/uL (ref 150–400)
RBC: 3.97 MIL/uL (ref 3.87–5.11)
WBC: 4.8 10*3/uL (ref 4.0–10.5)

## 2011-11-07 LAB — FOLATE: Folate: >20 ng/mL

## 2011-11-07 LAB — VITAMIN B12: Vitamin B-12: 989 pg/mL — ABNORMAL HIGH (ref 211–911)

## 2011-11-07 LAB — FOLATE RBC: RBC Folate: 1479 ng/mL (ref >=366)

## 2011-11-07 LAB — IRON: Iron: 85 ug/dL (ref 42–145)

## 2011-11-26 ENCOUNTER — Other Ambulatory Visit: Payer: Self-pay | Admitting: *Deleted

## 2011-11-26 DIAGNOSIS — E039 Hypothyroidism, unspecified: Secondary | ICD-10-CM

## 2011-11-27 ENCOUNTER — Ambulatory Visit: Payer: Medicare Other | Admitting: "Endocrinology

## 2011-12-04 ENCOUNTER — Other Ambulatory Visit: Payer: Self-pay | Admitting: *Deleted

## 2011-12-04 DIAGNOSIS — E039 Hypothyroidism, unspecified: Secondary | ICD-10-CM

## 2011-12-06 ENCOUNTER — Other Ambulatory Visit: Payer: Self-pay | Admitting: "Endocrinology

## 2011-12-18 LAB — COMPREHENSIVE METABOLIC PANEL
AST: 21 U/L (ref 0–37)
Albumin: 4.5 g/dL (ref 3.5–5.2)
BUN: 14 mg/dL (ref 6–23)
Calcium: 9.6 mg/dL (ref 8.4–10.5)
Chloride: 99 mEq/L (ref 96–112)
Potassium: 4.2 mEq/L (ref 3.5–5.3)
Sodium: 138 mEq/L (ref 135–145)
Total Protein: 7.4 g/dL (ref 6.0–8.3)

## 2011-12-18 LAB — CBC WITH DIFFERENTIAL/PLATELET
Basophils Absolute: 0.1 10*3/uL (ref 0.0–0.1)
Basophils Relative: 1 % (ref 0–1)
Eosinophils Absolute: 0.1 10*3/uL (ref 0.0–0.7)
Hemoglobin: 14.4 g/dL (ref 12.0–15.0)
MCHC: 32.4 g/dL (ref 30.0–36.0)
Monocytes Relative: 10 % (ref 3–12)
Neutro Abs: 2.8 10*3/uL (ref 1.7–7.7)
Neutrophils Relative %: 56 % (ref 43–77)
Platelets: 233 10*3/uL (ref 150–400)
RDW: 13.8 % (ref 11.5–15.5)

## 2011-12-18 LAB — T4, FREE: Free T4: 1.39 ng/dL (ref 0.80–1.80)

## 2011-12-20 ENCOUNTER — Ambulatory Visit (INDEPENDENT_AMBULATORY_CARE_PROVIDER_SITE_OTHER): Payer: Medicare Other | Admitting: "Endocrinology

## 2011-12-20 ENCOUNTER — Encounter: Payer: Self-pay | Admitting: "Endocrinology

## 2011-12-20 VITALS — BP 126/69 | HR 64 | Wt 108.6 lb

## 2011-12-20 DIAGNOSIS — Z72820 Sleep deprivation: Secondary | ICD-10-CM | POA: Insufficient documentation

## 2011-12-20 DIAGNOSIS — E890 Postprocedural hypothyroidism: Secondary | ICD-10-CM

## 2011-12-20 DIAGNOSIS — E0500 Thyrotoxicosis with diffuse goiter without thyrotoxic crisis or storm: Secondary | ICD-10-CM

## 2011-12-20 DIAGNOSIS — E049 Nontoxic goiter, unspecified: Secondary | ICD-10-CM

## 2011-12-20 DIAGNOSIS — H532 Diplopia: Secondary | ICD-10-CM

## 2011-12-20 MED ORDER — METHIMAZOLE 5 MG PO TABS: 5.0000 mg | ORAL_TABLET | Freq: Two times a day (BID) | ORAL | Status: DC

## 2011-12-20 NOTE — Progress Notes (Addendum)
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  1. I have followed the patient for the above problems since 03.09.11. Over time I have gradually increased her Synthroid dose from 50 mcg a day, to 75 mcg 6 days a week, to 75 mcg every day. From the perspective of being hyper/hypothyroid, the patient has done quite well. 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 2. 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 At that time she had no evidence of exophthalmos. Unfortunately, the patient did develop exophthalmos over time. This began with puffiness of her lower eyelids which she noted in late August and reported to me in early September. At that point her eyelids were more prominent. The conjunctivae of the lower lids were somewhat exposed and were watery. I initially thought this might just be allergic rhinitis and conjunctivitis. However by 11.14.11, the eyelids were even more prominent and her eyes were becoming more prominent as well. At that time we knew that her thyroid stimulating immunoglobulin level from 08/29/10 was high at 545. She contacted me and I told her it seemed as if she were developing chemosis secondary to either the high TSI level or perhaps some other protein produced by the Graves' disease B lymphocytes. We discussed options for medical treatment, to include starting her on propylthiouracil (PTU) or methimazole (MTZ). I told her while these medications are usually very mild and benign drugs, there have been instances of liver failure, bone marrow failure  and septic death associated with  both PTU and methimazole. The FDA had recently put out a black box warning about possible liver failure with PTU. Hearing that disclaimer, the patient did not want to begin either medication at that time. Since she had already scheduled an appointment with an ophthalmologist at [facility] for December, we decided to see what that specialist would recommend. The patient subsequently asked me to refer her to the [facility]. On my exam on 10/05/10, the chemosis was certainly worse. Proptometer readings showed that her eyes were relatively prominent, but not frankly proptotic. On 10/09/10 she saw Dr. Henry Russel at the [facility]. Dr. Kate Sable noted that she did have chemosis, but no proptosis, diplopia, motility restriction, visual field defects, or lid lag. She did have some minimal lid retraction. It was Dr. Lonia Mad belief that the patient did not need surgery. Dr.  Kate Sable performed bilateral Kenalog injections into the orbits. At that point the TSI result from 10/06/2010 became available.The TSI value had decreased to 443. By the time of her next visit on 01/17/11, she was on Synthroid 75 mcg, 6 days per week. Her eyes were better, but still watered a lot the mornings. On examination her eyes were less prominent. The eyelids covered the tops of both corneas. The left lower eyelid covered 100% of the bottom of the left cornea. The right lower lid covered about 80% of the bottom of the right cornea. The soft tissue behind the lower lids was less swollen. Her TSI value increased up to 552 on 12/22/10, but subsequently came down to 359 on 02/20/2011 and to 347 on 04/23/11. On 08/29/11, however, her TSI increased to 365. TSH was  12.759. Free T4 was 1.08. Free T3 was 2.0. 3. Her last [facility] visit was on 09/18/11. She still had significant exophthalmos. I increased her Synthroid to 75 mcg/day and started her on methimazole, 5 mg/day. In the interim, she has been healthy and  active. Her current Synthroid dose is 75 mcg per day. Her methimazole dose is 7.5 mg/day/ 4. Pertinent Review of systems: Constitutional: The patient feels "fine". She has less energy, less get up and go,  during the winter months.  She is sleeping "OK, not fabulous, not terrible".  Eyes: She still has partial double vision despite having prism lenses bilaterally. The double vision has been essentially unchanged for months. Neck: The patient has no complaints of anterior neck swelling, soreness, tenderness,  pressure, discomfort, or difficulty swallowing.  Heart: Heart rate increases with exercise or other physical activity. The patient occasionally feels a few seconds of rapid heart beat. She has no complaints of irregular heat beats, or chest pain. Gastrointestinal: She occasionally notes "stomach" gas gurgling and moving. Bowel movents seem normal. The patient has no complaints of excessive hunger, acid reflux, upset stomach, stomach aches or pains, diarrhea, or constipation. Legs: Muscle mass and strength seem normal. There are no complaints of numbness, tingling, burning, or pain. No edema is noted. Feet: There are no obvious foot problems. There are no complaints of numbness, tingling, burning, or pain. No edema is noted.  PMFSH: 1. 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 2. Activities: She is still very active in the community, but is not doing as many art projects herself. 3. Tobacco, alcohol, illicit drugs: None 4. Primary care provider: Dr. Benjaman Kindler  ROS: There are no other significant problems involving her other body systems.  PHYSICAL EXAM: BP 126/69  Pulse 64  Wt 108 lb 9.6 oz (49.261 kg) Constitutional: The patient looks healthy and appears physically and emotionally well.  Eyes: There is no arcus. Her eyes are watering a lot. She has no superior proptosis. There is no inferior proptosis of the left eye, and only slight inferior  proptosis of  the right eye. The inferior chemosis is almost resolved on the left and just a bit larger on the right. The edema of the upper lids is less. The edema of the lower lids is still significant.  Mouth: The oropharynx appears normal. The tongue appears normal. There is normal oral moisture. There is no obvious gingivitis. Neck: There are no bruits present. The thyroid gland is 20+ grams in size. The right lobe is within normal limits for size. Left lobe is slightly enlarged and is relatively normal in consistency. There is no thyroid tenderness to palpation. Lungs: The lungs are clear. Air movement is good. Heart: The heart rhythm and rate appear normal. Heart sounds S1 and S2 are normal. I do not appreciate any pathologic heart murmurs. Abdomen: The abdominal size is normal. Bowel sounds are normal. The abdomen is soft and non-tender. There is no obviously palpable hepatomegaly, splenomegaly, or other masses.  Arms: Muscle mass appears appropriate for age.  Hands: There is no obvious tremor. Phalangeal and metacarpophalangeal joints appear normal. Palms are normal. Legs: Muscle mass appears appropriate for age. There is no edema.  Neurologic: Muscle strength is normal for age and gender in both the upper and the lower extremities. Muscle tone appears normal. Sensation to touch is normal in legs.  Laboratory data: 12/17/11: TSH was 1.517.  Free T4 was 1.39. Free T3 was  2.4. TSI was 539. CBC and CMP were normal.  ASSESSMENT: 1. Hypothyroid: The patient is euthyroid on her current dose of Synthroid and with her current level of TSI stimulation.  2. Exophthalmos: This problem is better at this visit. Whether the improvement is due to methimazole or to other factors is unclear. I can't predict whether this recent increase in TSI will make the Graves' eye disease worse again. We will have to watch over time as we adjust the methimazole doses. Thus far she shows no evidence of any adverse effects from  methimazole.   3. Goiter: Thyroid gland is a bit smaller today. 4. Sleep problems: She is doing better. 5. Thyroiditis: Her thyroiditis is clinically quiescent. 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 6. Diplopia/ Visual distortion/Partial diplopia: This problem persists at about the same level. It does not bother her when she is looking at near objects, but does bother her when looking objects at intermediate and far distances.   PLAN: 1. Diagnostic: In one month we'll obtain another set of thyroid function tests, TSI, CBC with differential, and CMP. 2. Therapeutic: We'll continue her Synthroid dose of 75 mcg daily.  We'll increase methimazole to 5 mg twice daily. 3. Patient education: We discussed the possible adverse effects of methimazole. I told her that while methimazole is generally considered to be a safe medication, some patients have developed hepatitis and liver failure, bone marrow suppression, low WBC counts and infection, low RBC counts and anemia, and low platelet counts and hemorrhaging. She is to call me immediately or go to the ED if she has a temperature greater than 100.5 degrees. 4. Follow-up: Next appointment will be in 2 months.  Level of Service: This visit lasted in excess of 40 minutes. More than 50% of the visit was devoted to counseling.   David Stall

## 2011-12-20 NOTE — Patient Instructions (Addendum)
Followup visit in 2 months. Please increase methimazole to 5 mg, twice daily. Please have lab tests done in about 4 weeks per

## 2012-01-17 LAB — COMPREHENSIVE METABOLIC PANEL
ALT: 12 U/L (ref 0–35)
AST: 19 U/L (ref 0–37)
Albumin: 4.7 g/dL (ref 3.5–5.2)
Alkaline Phosphatase: 52 U/L (ref 39–117)
BUN: 10 mg/dL (ref 6–23)
Calcium: 9.5 mg/dL (ref 8.4–10.5)
Chloride: 99 mEq/L (ref 96–112)
Potassium: 4 mEq/L (ref 3.5–5.3)
Sodium: 138 mEq/L (ref 135–145)
Total Protein: 7.3 g/dL (ref 6.0–8.3)

## 2012-01-17 LAB — CBC WITH DIFFERENTIAL/PLATELET
Basophils Absolute: 0 10*3/uL (ref 0.0–0.1)
Basophils Relative: 1 % (ref 0–1)
Eosinophils Absolute: 0.1 10*3/uL (ref 0.0–0.7)
MCH: 32.9 pg (ref 26.0–34.0)
MCHC: 33.2 g/dL (ref 30.0–36.0)
Neutro Abs: 3.7 10*3/uL (ref 1.7–7.7)
Neutrophils Relative %: 66 % (ref 43–77)
Platelets: 239 10*3/uL (ref 150–400)
RDW: 13.9 % (ref 11.5–15.5)

## 2012-01-17 LAB — T3, FREE: T3, Free: 2.6 pg/mL (ref 2.3–4.2)

## 2012-01-17 LAB — T4, FREE: Free T4: 1.31 ng/dL (ref 0.80–1.80)

## 2012-01-19 ENCOUNTER — Telehealth: Payer: Self-pay | Admitting: "Endocrinology

## 2012-01-19 NOTE — Telephone Encounter (Signed)
I called the patient with her lab results, but she was not available. I left a VM message that her kidney studies, liver studies, RBC studies, WBC studies, and TFTs were all normal. Her TSI, however, has increased to 608. We need to talk about the pros and cons of further increasing her methimazole doses. I asked her to call me at her convenience tomorrow or Monday. David Stall

## 2012-01-21 ENCOUNTER — Telehealth: Payer: Self-pay | Admitting: "Endocrinology

## 2012-01-21 DIAGNOSIS — E0500 Thyrotoxicosis with diffuse goiter without thyrotoxic crisis or storm: Secondary | ICD-10-CM

## 2012-01-21 NOTE — Telephone Encounter (Signed)
Telephone patient re her recent lab results from 01/16/12. 1. Her TSI level is higher. Her Graves' Disease WBCs are trying to re-activate her thyrotoxicosis. She is currently taking methimazole, 5 mg, twice daily.  2. We discussed the pros and cons of increasing MTZ, vs thyroidectomy, vs switching to PTU. Since she is on only a low dose of MTZ, since she has not had any adverse effects from MTZ, since she is more likely to have AEs with PTU, and since she is more likely to have peri-thyroidal scarring due to I-131 that might make thyroidectomy riskier, the best option is to slowly increase her MTZ. She concurs. 3. Increase MTZ to 7.5 mg, twice daily. Repeat TFT and TSI in 4 weeks. David Stall

## 2012-02-08 ENCOUNTER — Other Ambulatory Visit: Payer: Self-pay | Admitting: *Deleted

## 2012-02-08 DIAGNOSIS — E0500 Thyrotoxicosis with diffuse goiter without thyrotoxic crisis or storm: Secondary | ICD-10-CM

## 2012-02-08 MED ORDER — METHIMAZOLE 5 MG PO TABS: 7.5000 mg | ORAL_TABLET | Freq: Two times a day (BID) | ORAL | Status: DC

## 2012-02-16 LAB — TSH: TSH: 1.301 u[IU]/mL (ref 0.350–4.500)

## 2012-02-16 LAB — T3, FREE: T3, Free: 2.3 pg/mL (ref 2.3–4.2)

## 2012-02-20 LAB — THYROID STIMULATING IMMUNOGLOBULIN: TSI: 560 % baseline — ABNORMAL HIGH (ref <140)

## 2012-02-28 ENCOUNTER — Ambulatory Visit (INDEPENDENT_AMBULATORY_CARE_PROVIDER_SITE_OTHER): Payer: Medicare Other | Admitting: "Endocrinology

## 2012-02-28 ENCOUNTER — Encounter: Payer: Self-pay | Admitting: "Endocrinology

## 2012-02-28 VITALS — BP 118/72 | HR 58 | Wt 108.2 lb

## 2012-02-28 DIAGNOSIS — H532 Diplopia: Secondary | ICD-10-CM

## 2012-02-28 DIAGNOSIS — H0520 Unspecified exophthalmos: Secondary | ICD-10-CM

## 2012-02-28 DIAGNOSIS — E063 Autoimmune thyroiditis: Secondary | ICD-10-CM

## 2012-02-28 DIAGNOSIS — E890 Postprocedural hypothyroidism: Secondary | ICD-10-CM

## 2012-02-28 DIAGNOSIS — E049 Nontoxic goiter, unspecified: Secondary | ICD-10-CM

## 2012-02-28 NOTE — Patient Instructions (Signed)
Please repeat lab tests in two months. Follow-up visit in late July.

## 2012-02-28 NOTE — Progress Notes (Signed)
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  1. I have followed the patient for the above problems since 12/28/09. Over time I have gradually increased her Synthroid dose from 50 mcg daily to 75 mcg daily. From the perspective of being hyper/hypothyroid, the patient has done quite well. 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 2. 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 At that time she had no evidence of exophthalmos. Unfortunately, the patient did develop exophthalmos over time. This began with puffiness of her lower eyelids which she noted in late August and reported to me in early September. At that point her eyelids were more prominent. The conjunctivae of the lower lids were somewhat exposed and were watery. I initially thought this might just be allergic rhinitis and conjunctivitis. However by 09/04/10, the eyelids were even more prominent and her eyes were becoming more prominent as well. At that time we knew that her thyroid stimulating immunoglobulin level from 08/29/10 was high at 545. She contacted me and I told her it seemed as if she were developing chemosis secondary to either the high TSI level or perhaps some other protein produced by the Graves' disease B lymphocytes. We discussed options for medical treatment, to include starting her on propylthiouracil (PTU) or methimazole (MTZ). I told her while these medications are usually very mild and benign drugs, there have been instances of liver failure, bone marrow failure  and septic death associated with both PTU and methimazole.  The FDA had recently put out a black box warning about possible liver failure with PTU. Hearing that disclaimer, the patient did not want to begin either medication at that time. Since she had already scheduled an appointment with an ophthalmologist at [facility] for December, we decided to see what that specialist would recommend. The patient subsequently asked me to refer her to the [facility]. On my exam on 10/05/10, the chemosis was certainly worse. Proptometer readings showed that her eyes were relatively prominent, but not frankly proptotic. On 10/09/10 she saw Dr. Henry Russel at the [facility]. Dr. Kate Sable noted that she did have chemosis, but no proptosis, diplopia, motility restriction, visual field defects, or lid lag. She did have some minimal lid retraction. It was Dr. Lonia Mad belief that the patient did not need surgery. Dr.  Kate Sable performed bilateral Kenalog injections into the orbits. At that point the TSI result from 10/06/2010 became available.The TSI value had decreased to 443. By the time of her next visit on 01/17/11, she was on Synthroid 75 mcg, 6 days per week. Her eyes were better, but still watered a lot the mornings. On examination her eyes were less prominent. The eyelids covered the tops of both corneas. The left lower eyelid covered 100% of the bottom of the left cornea. The right lower lid covered about 80% of the bottom of the right cornea. The soft tissue behind the lower lids was less swollen. Her TSI value increased up to 552 on 12/22/10, but subsequently came down to 359 on 02/20/2011 and to 347 on 04/23/11. On 08/29/11, however, her TSI increased to 365. TSH was 12.759. Free T4 was 1.08. Free T3 was 2.0.  3. Her last [facility] visit was on 11/30/11. TSI increased to 539 on 12/17/11 and even further to 603 on 01/16/12. Her methimazole was then increased to 7.5 mcg, twice daily.   She has felt "just fine'. She has been healthy and emotionally  well. Her eyes are getting better. Her methimazole dose remains at 7.5 mg twice daily. 4. Pertinent Review of systems: Constitutional: The patient feels "fine". She has a good energy level. She has been very busy. She is sleeping "OK, maybe a tad better".  Eyes: She still has partial double vision. She had an eye exam last week. She will have a new pair of prism-progressive lenses.  Neck: The patient has no complaints of anterior neck swelling, soreness, tenderness,  pressure, discomfort, or difficulty swallowing.  Heart: She occasionally notices a heartbeat. The patient occasionally feels a few seconds of rapid heart beat. She has no complaints of chest pain or pressure.  Gastrointestinal: She has no complaints.  Bowel movents seem normal. The patient has no complaints of excessive hunger, acid reflux, upset stomach, stomach aches or pains, diarrhea, or constipation. Legs: Muscle mass and strength seem normal. There are no complaints of numbness, tingling, burning, or pain. No edema is noted. Feet: There are no obvious foot problems. There are no complaints of numbness, tingling, burning, or pain. No edema is noted.  PMFSH: 1. 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  2. Activities: She is still very active in the community and is doing volunteer work.  3. Tobacco, alcohol, illicit drugs: None 4. Primary care provider: Dr. Benjaman Kindler  ROS: There are no other significant problems involving her other body systems.  PHYSICAL EXAM: BP 118/72  Pulse 58  Wt 108 lb 3.2 oz (49.079 kg) Constitutional: The patient looks healthy and appears physically and emotionally well.  Eyes: There is no arcus. Her eyes are watering a little. She has no proptosis. The inferior chemosis is almost resolved on the right and is just a bit larger on the left. The edema of the upper lids is less, better on the right.. The edema of the right lower lid is much better, while the swelling of the  left lower lid is more obvious, but has also improved. Her exophthalmos is visibly much better than it was three months ago.  Mouth: The oropharynx appears normal. The tongue appears normal. There is normal oral moisture. There is no obvious gingivitis. Neck: There are no bruits present. The thyroid gland is 20+ grams in size. The right lobe is within normal limits for size. Left lobe is slightly enlarged and is relatively normal in consistency. There is no thyroid tenderness to palpation. Lungs: The lungs are clear. Air movement is good. Heart: The heart rhythm and rate appear normal. Heart sounds S1 and S2 are normal. I do not appreciate any pathologic heart murmurs. Abdomen: The abdominal size is normal. Bowel sounds are normal. The abdomen is soft and non-tender. There is no obviously palpable hepatomegaly, splenomegaly, or other masses.  Arms: Muscle mass appears appropriate for age.  Hands: There is no obvious tremor. Phalangeal and metacarpophalangeal joints appear normal. Palms are normal. She has some pallor of her finger nails  Legs: Muscle mass appears appropriate for age. There is no edema.  Neurologic: Muscle strength is normal for age and gender in both the upper and the lower extremities. Muscle tone appears normal. Sensation to touch is normal in legs.  Laboratory data: 02/15/12: TSH was  1.301.  Free T4 was 1.09. Free T3 was 2.3. TSI was 560, as compared with 603 one month ago and 539 two months ago.   ASSESSMENT: 1. Hypothyroid: The patient is euthyroid on her current doses of Synthroid and MTZ. She feels fine and looks great.  2. Exophthalmos: This problem is better again on this visit.  We will have to watch her closely over time as we adjust the methimazole doses. Thus far she shows no evidence of any adverse effects from methimazole.   3. Goiter: Thyroid gland is just a smidgeon enlarged today. 4. Sleep problems: She is doing better. 5. 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 6. Diplopia/ Visual distortion/Partial diplopia: This problem appears to be better.  PLAN: 1. Diagnostic: In two months we'll obtain another set of thyroid function tests, TSI, CBC with differential, iron, and CMP. 2. Therapeutic: We'll continue her Synthroid dose of 75 mcg daily.  We'll continue  methimazole  7.5 mg twice daily. 3. Patient education: We discussed the possible adverse effects of methimazole. I reminded her that while methimazole is generally considered to be a safe medication, some patients have developed hepatitis and liver failure, bone marrow suppression, low WBC counts and infection, low RBC counts and anemia, and low platelet counts and hemorrhaging. She is to call me immediately or go to the ED if she has a temperature greater than 100.5 degrees. 4. Follow-up: Next appointment will be in 2 months.  Level of Service: This visit lasted in excess of 40 minutes. More than 50% of the visit was devoted to counseling.   David Stall

## 2012-04-10 ENCOUNTER — Ambulatory Visit: Payer: Medicare Other | Admitting: "Endocrinology

## 2012-04-16 ENCOUNTER — Other Ambulatory Visit: Payer: Self-pay | Admitting: *Deleted

## 2012-04-16 DIAGNOSIS — E0500 Thyrotoxicosis with diffuse goiter without thyrotoxic crisis or storm: Secondary | ICD-10-CM

## 2012-05-06 ENCOUNTER — Other Ambulatory Visit: Payer: Self-pay | Admitting: "Endocrinology

## 2012-05-12 LAB — CBC WITH DIFFERENTIAL/PLATELET
Basophils Absolute: 0 10*3/uL (ref 0.0–0.1)
Basophils Relative: 1 % (ref 0–1)
Eosinophils Relative: 1 % (ref 0–5)
HCT: 38.3 % (ref 36.0–46.0)
Hemoglobin: 12.7 g/dL (ref 12.0–15.0)
MCH: 32.4 pg (ref 26.0–34.0)
MCHC: 33.2 g/dL (ref 30.0–36.0)
MCV: 97.7 fL (ref 78.0–100.0)
Monocytes Absolute: 0.6 10*3/uL (ref 0.1–1.0)
Monocytes Relative: 12 % (ref 3–12)
RDW: 14.4 % (ref 11.5–15.5)

## 2012-05-13 LAB — IRON: Iron: 141 ug/dL (ref 42–145)

## 2012-05-13 LAB — COMPREHENSIVE METABOLIC PANEL
ALT: 14 U/L (ref 0–35)
AST: 20 U/L (ref 0–37)
Alkaline Phosphatase: 50 U/L (ref 39–117)
BUN: 12 mg/dL (ref 6–23)
Chloride: 100 mEq/L (ref 96–112)
Creat: 0.61 mg/dL (ref 0.50–1.10)
Total Bilirubin: 0.5 mg/dL (ref 0.3–1.2)

## 2012-05-13 LAB — T3, FREE: T3, Free: 2.3 pg/mL (ref 2.3–4.2)

## 2012-05-13 LAB — T4, FREE: Free T4: 1.31 ng/dL (ref 0.80–1.80)

## 2012-05-13 LAB — TSH: TSH: 0.847 u[IU]/mL (ref 0.350–4.500)

## 2012-05-20 ENCOUNTER — Encounter: Payer: Self-pay | Admitting: "Endocrinology

## 2012-05-20 ENCOUNTER — Ambulatory Visit (INDEPENDENT_AMBULATORY_CARE_PROVIDER_SITE_OTHER): Payer: Medicare Other | Admitting: "Endocrinology

## 2012-05-20 VITALS — BP 115/63 | HR 58 | Wt 106.0 lb

## 2012-05-20 DIAGNOSIS — E0500 Thyrotoxicosis with diffuse goiter without thyrotoxic crisis or storm: Secondary | ICD-10-CM

## 2012-05-20 DIAGNOSIS — H532 Diplopia: Secondary | ICD-10-CM

## 2012-05-20 DIAGNOSIS — E049 Nontoxic goiter, unspecified: Secondary | ICD-10-CM

## 2012-05-20 DIAGNOSIS — E063 Autoimmune thyroiditis: Secondary | ICD-10-CM

## 2012-05-20 DIAGNOSIS — E890 Postprocedural hypothyroidism: Secondary | ICD-10-CM

## 2012-05-20 DIAGNOSIS — G479 Sleep disorder, unspecified: Secondary | ICD-10-CM

## 2012-05-20 NOTE — Progress Notes (Signed)
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  1. I have followed the patient for the above problems since 12/28/09. Over time I have gradually increased her Synthroid dose from 50 mcg daily to 75 mcg daily. From the perspective of being hyper/hypothyroid, the patient has done quite well. 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 2. 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 At that time she had no evidence of exophthalmos.   A. Unfortunately, the patient developed progressively worsening exophthalmos over time in association with very high thyroid stimulating immunoglobulin (TSI) levels. By 09/04/10, her eyelids had become very swollen and her eyes very proptotic. We discussed options for medical treatment, to include starting her on propylthiouracil (PTU) or methimazole (MTZ). After I informed her about the possible, although rare, adverse effects associated with such therapy, the patient declined medical treatment.   B. She was subsequently evaluated at the [facility] on 10/09/10, where she received bilateral Kenalog injections into the orbits. Her exophthalmos improved for several months, in part due to the injections, but also in part due to a spontaneous decline in TSI.  Unfortunately, her TSI values and exophthalmos waxed and waned for about 9 months, then steadily worsened.   C. On 09/18/11 I again offered methimazole (MTZ) treatment to her and she accepted.  Despite very low-dose MTZ treatment her TSI levels continued to increase until late March of this  year, when I increased the MTZ dose to 7.5 mg, twice daily. Since then the TSI levels have decreased. Interestingly, her exophthalmos began to improve soon after starting MTZ therapy and has progressively improved ever since. Although combining MTZ and Synthroid may seem counterintuitive to most non-thyroidologists and even many endocrinologists, the patient has been euthyroid since starting the combination therapy. She remains euthyroid on her combination of MTZ and Synthroid. Thus far she has shown no evidence of adverse effects from the combination therapy.      3. Her last [facility] visit was on 02/28/12. She remains on her  Methimazole, 7.5 mcg, twice daily and Synthroid, 75 mcg/day. She has been healthy and emotionally well. Her eyes are getting better.   4. Pertinent Review of systems: Constitutional: The patient feels "fine". She has a good energy level. She has been very busy. She has been sleeping pretty well, but still occasionally has a bad night, such as last night.   Eyes: She still has partial double vision. She has a new pair of prism-progressive lenses which work well. However, sometimes she takes the glasses off and can still see fairly well, except at long distances. She feels that her eyelid swelling may be better, certainly not worse.   Neck: The patient has no complaints of anterior neck swelling, soreness, tenderness,  pressure, discomfort, or difficulty swallowing.  Heart: She occasionally notices a prominent heartbeat. She has no complaints of chest pain or pressure.  Gastrointestinal: She has no complaints.  Bowel movents seem normal. The patient has no complaints of excessive hunger, acid reflux, upset stomach, stomach aches or pains, diarrhea, or constipation. Legs: Muscle mass and strength seem normal. There are no complaints of numbness, tingling, burning, or pain. No edema is noted. Feet: There  are no obvious foot problems. She occasionally feel some burning in her feet after  walking a lot or yoga. There are no complaints of numbness, tingling, burning, or pain. No edema is noted.  PAST MEDICAL, FAMILY, AND SOCIAL HISTORY: 1. 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  2. Activities: She is still very active in the community and is doing volunteer work. She danced a lot at a recent wedding.  3. Tobacco, alcohol, illicit drugs: None 4. Primary care provider: Dr. Benjaman Kindler  REVIEW OF SYSTEMS: There are no other significant problems involving her other body systems.  PHYSICAL EXAM: BP 115/63  Pulse 58  Wt 106 lb (48.081 kg) Constitutional: The patient looks healthy and appears physically and emotionally well.  Eyes: There is no arcus. Her eyes are watering a little. She has no proptosis. The inferior chemosis is still present bilaterally, but improved.  The edema of the upper lids is less, better on the left. The edema of the right lower lid is much better, while the swelling of the left lower lid is more obvious, but has also improved. Her exophthalmos is visibly much better than it was three months ago.  Mouth: The oropharynx appears normal. She has a trace tongue tremor. There is normal oral moisture. There is no obvious gingivitis. Neck: There are no bruits present. The thyroid gland size is normal today, in the 18-20 grams range. There is no thyroid tenderness to palpation. Lungs: The lungs are clear. Air movement is good. Heart: The heart rhythm and rate appear normal. Heart sounds S1 and S2 are normal. I do not appreciate any pathologic heart murmurs. Abdomen: The abdominal size is normal. Bowel sounds are normal. The abdomen is soft and non-tender. There is no obviously palpable hepatomegaly, splenomegaly, or other masses.  Arms: Muscle mass appears appropriate for age.  Hands: There is no obvious tremor. Phalangeal and metacarpophalangeal joints appear normal. Palms are normal. She has some pallor of her finger nails  Legs: Muscle  mass appears appropriate for age. There is no edema.  Neurologic: Muscle strength is normal for age and gender in both the upper and the lower extremities. Muscle tone appears normal. Sensation to touch is normal in legs.  LABORATORY DATA:  05/12/12: TSH was 0.847. Free T4 was 1.31. Free T3 was 2.3. TSI was 487. CMP was normal. CBC was normal. Iron was 141. 02/15/12: TSH was 1.301.  Free T4 was 1.09. Free T3 was 2.3. TSI was 560, as compared with 603 one month ago and 539 two months ago.   ASSESSMENT: 1. Hypothyroid: The patient is euthyroid on her current doses of Synthroid and MTZ. She feels fine and looks great. She no longer feels tired. In fact, she has a lot of energy. She is sleeping pretty well. It makes sense to continue her on her current doses of Synthroid and MTZ.  2. Exophthalmos: This problem is better again on this visit. Her TSI levels are trending downward.  We will have to watch her closely over time as we adjust the methimazole doses. Thus far she shows no evidence of any adverse effects from methimazole.   3. Goiter: Thyroid gland is within normal size today.  4. Sleep problems: She is doing better. 5. Thyroiditis: Her thyroiditis is clinically quiescent. 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 6. Diplopia/ Visual distortion/Partial diplopia: This problem has improved greatly clinically with  her prism glasses. She can now take her glasses off and see fairly well for short times.   PLAN: 1. Diagnostic: In 2-1/2 months we'll obtain another set of thyroid function tests, TSI, calcium, PTH, 25 Vitamin D and 1,25 vitamin D 2. Therapeutic: We'll continue her Synthroid dose of 75 mcg daily.  We'll continue  methimazole  7.5 mg twice daily. 3. Patient education: We discussed the possible adverse effects of methimazole. I reminded her that while methimazole is generally considered to be a safe medication,  some patients have developed hepatitis and liver failure, bone marrow suppression, low WBC counts and infection, low RBC counts and anemia, and low platelet counts and hemorrhaging. She is to call me immediately or go to the ED if she has a temperature greater than 100.5 degrees. 4. Follow-up: Next appointment will be in 3 months.  Level of Service: This visit lasted in excess of 40 minutes. More than 50% of the visit was devoted to counseling.   David Stall

## 2012-05-20 NOTE — Patient Instructions (Signed)
Follow-up visit in 3 months. Continue current medications.

## 2012-06-05 ENCOUNTER — Other Ambulatory Visit: Payer: Self-pay | Admitting: "Endocrinology

## 2012-06-09 ENCOUNTER — Other Ambulatory Visit: Payer: Self-pay | Admitting: *Deleted

## 2012-06-09 DIAGNOSIS — E0500 Thyrotoxicosis with diffuse goiter without thyrotoxic crisis or storm: Secondary | ICD-10-CM

## 2012-06-09 MED ORDER — METHIMAZOLE 5 MG PO TABS: ORAL_TABLET | ORAL | Status: DC

## 2012-06-27 ENCOUNTER — Encounter: Payer: Self-pay | Admitting: *Deleted

## 2012-08-19 ENCOUNTER — Other Ambulatory Visit: Payer: Self-pay | Admitting: *Deleted

## 2012-08-19 DIAGNOSIS — E559 Vitamin D deficiency, unspecified: Secondary | ICD-10-CM

## 2012-08-19 DIAGNOSIS — E0590 Thyrotoxicosis, unspecified without thyrotoxic crisis or storm: Secondary | ICD-10-CM

## 2012-08-26 LAB — THYROID STIMULATING IMMUNOGLOBULIN

## 2012-08-26 LAB — T3, FREE: T3, Free: 2.5 pg/mL (ref 2.3–4.2)

## 2012-08-26 LAB — T4, FREE: Free T4: 1.26 ng/dL (ref 0.80–1.80)

## 2012-08-26 LAB — CALCIUM: Calcium: 9.8 mg/dL (ref 8.4–10.5)

## 2012-08-29 ENCOUNTER — Other Ambulatory Visit: Payer: Self-pay | Admitting: "Endocrinology

## 2012-09-01 LAB — PTH, INTACT AND CALCIUM: PTH: 31.6 pg/mL (ref 14.0–72.0)

## 2012-09-09 ENCOUNTER — Telehealth: Payer: Self-pay | Admitting: "Endocrinology

## 2012-09-09 NOTE — Telephone Encounter (Signed)
1. I notified the patient that her recent TFTs, calcium, and PTH were all normal. The TSI was ordered, but apparently not drawn.  2. We will repeat the TSI, CBC, and CMP. 3. Continue medications as is. David Stall

## 2012-09-11 ENCOUNTER — Ambulatory Visit: Payer: Medicare Other | Admitting: "Endocrinology

## 2014-12-28 DIAGNOSIS — H8309 Labyrinthitis, unspecified ear: Secondary | ICD-10-CM | POA: Diagnosis not present

## 2015-01-04 DIAGNOSIS — H2513 Age-related nuclear cataract, bilateral: Secondary | ICD-10-CM | POA: Diagnosis not present

## 2015-01-04 DIAGNOSIS — E0500 Thyrotoxicosis with diffuse goiter without thyrotoxic crisis or storm: Secondary | ICD-10-CM | POA: Diagnosis not present

## 2015-01-13 DIAGNOSIS — Z0000 Encounter for general adult medical examination without abnormal findings: Secondary | ICD-10-CM | POA: Diagnosis not present

## 2015-01-13 DIAGNOSIS — E039 Hypothyroidism, unspecified: Secondary | ICD-10-CM | POA: Diagnosis not present

## 2015-01-13 DIAGNOSIS — F419 Anxiety disorder, unspecified: Secondary | ICD-10-CM | POA: Diagnosis not present

## 2015-01-13 DIAGNOSIS — E780 Pure hypercholesterolemia: Secondary | ICD-10-CM | POA: Diagnosis not present

## 2015-01-13 DIAGNOSIS — Z1389 Encounter for screening for other disorder: Secondary | ICD-10-CM | POA: Diagnosis not present

## 2015-01-13 DIAGNOSIS — M810 Age-related osteoporosis without current pathological fracture: Secondary | ICD-10-CM | POA: Diagnosis not present

## 2015-01-13 DIAGNOSIS — Z86010 Personal history of colonic polyps: Secondary | ICD-10-CM | POA: Diagnosis not present

## 2015-01-13 DIAGNOSIS — Z8639 Personal history of other endocrine, nutritional and metabolic disease: Secondary | ICD-10-CM | POA: Diagnosis not present

## 2015-02-07 DIAGNOSIS — M79672 Pain in left foot: Secondary | ICD-10-CM | POA: Diagnosis not present

## 2015-03-01 DIAGNOSIS — R42 Dizziness and giddiness: Secondary | ICD-10-CM | POA: Diagnosis not present

## 2015-03-01 DIAGNOSIS — E039 Hypothyroidism, unspecified: Secondary | ICD-10-CM | POA: Diagnosis not present

## 2015-03-01 DIAGNOSIS — K644 Residual hemorrhoidal skin tags: Secondary | ICD-10-CM | POA: Diagnosis not present

## 2015-03-28 DIAGNOSIS — J302 Other seasonal allergic rhinitis: Secondary | ICD-10-CM | POA: Diagnosis not present

## 2015-04-13 ENCOUNTER — Ambulatory Visit (INDEPENDENT_AMBULATORY_CARE_PROVIDER_SITE_OTHER): Payer: Medicare HMO | Admitting: Sports Medicine

## 2015-04-13 VITALS — BP 135/61 | Ht 62.5 in | Wt 111.0 lb

## 2015-04-13 DIAGNOSIS — M2010 Hallux valgus (acquired), unspecified foot: Secondary | ICD-10-CM | POA: Diagnosis not present

## 2015-04-13 DIAGNOSIS — M79672 Pain in left foot: Secondary | ICD-10-CM | POA: Diagnosis not present

## 2015-04-13 DIAGNOSIS — M4120 Other idiopathic scoliosis, site unspecified: Secondary | ICD-10-CM | POA: Diagnosis not present

## 2015-04-13 NOTE — Assessment & Plan Note (Signed)
I believe the muscle imbalance gives her some spasm in her trapezius I gave her scapular stabilization exercises T. And H. Stretches  bet sure to work with postural position to lessen the stress in this area

## 2015-04-13 NOTE — Assessment & Plan Note (Signed)
For bilateral bunions and changes of her foot I think we would help her about providing for metatarsal padding particularly for the right foot

## 2015-04-13 NOTE — Progress Notes (Signed)
Patient ID: Glenda Perez, female   DOB: 07/23/1941, 74 y.o.   MRN: [id number]  Patient complains of some upper right shoulder pain She notes this with certain yoga positions It does not awaken her at night She has a history of some scoliosis  Her other issue is that 6 weeks ago she had pain over the left lateral foot This was associated with wearing heels and standing for a while No clear injury occurred This has gradually resolved over the last couple weeks  Medical history of low bone density, osteopenia She does take some calcium supplementation as well as vitamin D Fish oil, and tumeric  Physical exam Pleasant female in no acute distress She does have some proptosis from prior thyroid disease BP 135/61 mmHg  Ht 5' 2.5" (1.588 m)  Wt 111 lb (50.349 kg)  BMI 19.97 kg/m2  Examination of the upper back shows a high thoracic curve the right shoulder and scapula elevated There is some posterior rotation of the right shoulder Rotator cuff testing is normal Strength is good Biceps testing is normal  Foot and ankle exam is unremarkable Area of prior tenderness is directly over the base of the fifth metatarsal Testing of peroneal tendons and peroneal tertius revealed good strength and no weakness  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

## 2015-07-05 DIAGNOSIS — H2513 Age-related nuclear cataract, bilateral: Secondary | ICD-10-CM | POA: Diagnosis not present

## 2015-07-05 DIAGNOSIS — E0500 Thyrotoxicosis with diffuse goiter without thyrotoxic crisis or storm: Secondary | ICD-10-CM | POA: Diagnosis not present

## 2015-07-05 DIAGNOSIS — H524 Presbyopia: Secondary | ICD-10-CM | POA: Diagnosis not present

## 2015-07-06 DIAGNOSIS — D239 Other benign neoplasm of skin, unspecified: Secondary | ICD-10-CM | POA: Diagnosis not present

## 2015-09-09 DIAGNOSIS — Z803 Family history of malignant neoplasm of breast: Secondary | ICD-10-CM | POA: Diagnosis not present

## 2015-09-09 DIAGNOSIS — Z1231 Encounter for screening mammogram for malignant neoplasm of breast: Secondary | ICD-10-CM | POA: Diagnosis not present

## 2015-09-23 ENCOUNTER — Emergency Department (HOSPITAL_COMMUNITY)
Admission: EM | Admit: 2015-09-23 | Discharge: 2015-09-24 | Disposition: A | Discharge status: Home / Self Care | Payer: Commercial Managed Care - HMO | Attending: Emergency Medicine | Admitting: Emergency Medicine

## 2015-09-23 ENCOUNTER — Encounter (HOSPITAL_COMMUNITY): Payer: Self-pay | Admitting: *Deleted

## 2015-09-23 ENCOUNTER — Emergency Department (HOSPITAL_COMMUNITY): Payer: Commercial Managed Care - HMO

## 2015-09-23 DIAGNOSIS — S42291A Other displaced fracture of upper end of right humerus, initial encounter for closed fracture: Secondary | ICD-10-CM

## 2015-09-23 DIAGNOSIS — W01198A Fall on same level from slipping, tripping and stumbling with subsequent striking against other object, initial encounter: Secondary | ICD-10-CM | POA: Insufficient documentation

## 2015-09-23 DIAGNOSIS — M25511 Pain in right shoulder: Secondary | ICD-10-CM | POA: Diagnosis not present

## 2015-09-23 DIAGNOSIS — S42214A Unspecified nondisplaced fracture of surgical neck of right humerus, initial encounter for closed fracture: Secondary | ICD-10-CM | POA: Diagnosis not present

## 2015-09-23 DIAGNOSIS — Z79899 Other long term (current) drug therapy: Secondary | ICD-10-CM | POA: Diagnosis not present

## 2015-09-23 DIAGNOSIS — Y998 Other external cause status: Secondary | ICD-10-CM | POA: Insufficient documentation

## 2015-09-23 DIAGNOSIS — Y9301 Activity, walking, marching and hiking: Secondary | ICD-10-CM | POA: Insufficient documentation

## 2015-09-23 DIAGNOSIS — S4991XA Unspecified injury of right shoulder and upper arm, initial encounter: Secondary | ICD-10-CM | POA: Diagnosis present

## 2015-09-23 DIAGNOSIS — E079 Disorder of thyroid, unspecified: Secondary | ICD-10-CM | POA: Diagnosis not present

## 2015-09-23 DIAGNOSIS — S42251A Displaced fracture of greater tuberosity of right humerus, initial encounter for closed fracture: Secondary | ICD-10-CM | POA: Diagnosis not present

## 2015-09-23 DIAGNOSIS — S42211A Unspecified displaced fracture of surgical neck of right humerus, initial encounter for closed fracture: Secondary | ICD-10-CM | POA: Diagnosis not present

## 2015-09-23 DIAGNOSIS — W101XXA Fall (on)(from) sidewalk curb, initial encounter: Secondary | ICD-10-CM | POA: Diagnosis not present

## 2015-09-23 DIAGNOSIS — S46001A Unspecified injury of muscle(s) and tendon(s) of the rotator cuff of right shoulder, initial encounter: Secondary | ICD-10-CM | POA: Diagnosis not present

## 2015-09-23 DIAGNOSIS — Y9289 Other specified places as the place of occurrence of the external cause: Secondary | ICD-10-CM | POA: Diagnosis not present

## 2015-09-23 HISTORY — DX: Disorder of thyroid, unspecified: E07.9

## 2015-09-23 MED ORDER — TRAMADOL HCL 50 MG PO TABS: 50.0000 mg | ORAL_TABLET | Freq: Four times a day (QID) | ORAL | Status: DC | PRN

## 2015-09-23 MED ORDER — OXYCODONE-ACETAMINOPHEN 5-325 MG PO TABS
1.0000 | ORAL_TABLET | Freq: Once | ORAL | Status: DC
  Filled 2015-09-23: qty 1

## 2015-09-23 MED ORDER — FENTANYL CITRATE (PF) 100 MCG/2ML IJ SOLN
50.0000 ug | Freq: Once | INTRAMUSCULAR | Status: AC
  Administered 2015-09-23: 50 ug via INTRAVENOUS
  Filled 2015-09-23: qty 2

## 2015-09-23 NOTE — ED Notes (Signed)
Pt fell and struck right shoulder on concrete.  Pt has pain in right shoulder

## 2015-09-23 NOTE — ED Notes (Signed)
Bed: WA03 Expected date:  Expected time:  Means of arrival:  Comments: 89F Fall R shoulder pain +deformity

## 2015-09-23 NOTE — ED Provider Notes (Signed)
CSN: [id number]     Arrival date & time 09/23/15  2205 History   First MD Initiated Contact with Patient 09/23/15 2220     Chief Complaint  Patient presents with  . Shoulder Injury     (Consider location/radiation/quality/duration/timing/severity/associated sxs/prior Treatment) HPI Comments: Patient states she was walking without looking where she was going and tripped on the curb.  She tried to catch her fall on outstretched hand. Patient complaining of pain in proximal right humerus and shoulder.  No elbow, wrist, or hand pain.  Patient is a 74 y.o. female presenting with shoulder injury. The history is provided by the patient. No language interpreter was used.  Shoulder Injury This is a new problem. The current episode started today. The problem has been unchanged. Exacerbated by: movement. She has tried immobilization for the symptoms. The treatment provided mild relief.    Past Medical History  Diagnosis Date  . Thyroid disease    History reviewed. No pertinent past surgical history. No family history on file. Social History  Substance Use Topics  . Smoking status: Never Smoker   . Smokeless tobacco: Never Used  . Alcohol Use: Yes   OB History    No data available     Review of Systems  All other systems reviewed and are negative.     Allergies  Review of patient's allergies indicates no known allergies.  Home Medications   Prior to Admission medications   Medication Sig Start Date End Date Taking? Authorizing Provider  calcium carbonate 1250 MG capsule Take 1,250 mg by mouth daily.      Historical Provider, MD  calcium citrate (CALCITRATE - DOSED IN MG ELEMENTAL CALCIUM) 950 MG tablet Take 1 tablet by mouth daily.    Historical Provider, MD  clonazePAM (KLONOPIN) 0.5 MG tablet Take 0.5 mg by mouth at bedtime.     Historical Provider, MD  fish oil-omega-3 fatty acids 1000 MG capsule Take 1 g by mouth daily.      Historical Provider, MD  Lutein 20 MG TABS Take 20  mg by mouth daily.      Historical Provider, MD  Melatonin 1 MG TABS Take 6 mg by mouth.      Historical Provider, MD  methimazole (TAPAZOLE) 5 MG tablet Take 1 1/2 tabs twice a day. 06/09/12   Sherrlyn Hock, MD  Multiple Vitamin (MULTIVITAMIN) tablet Take 1 tablet by mouth daily.      Historical Provider, MD  pyridOXINE (VITAMIN B-6) 50 MG tablet Take 50 mg by mouth daily.    Historical Provider, MD  SYNTHROID 75 MCG tablet take 1 tablet by mouth once daily 05/06/12   Sherrlyn Hock, MD  vitamin E 1000 UNIT capsule Take 1,000 Units by mouth daily.     Historical Provider, MD   BP 104/56 mmHg  Pulse 53  Temp(Src) 96.7 F (35.9 C) (Axillary)  Ht 5\' 3"  (1.6 m)  Wt 49.896 kg  BMI 19.49 kg/m2  SpO2 100% Physical Exam  Constitutional: She is oriented to person, place, and time. She appears well-developed and well-nourished.  HENT:  Head: Normocephalic and atraumatic.  Eyes: EOM are normal. Pupils are equal, round, and reactive to light.  Neck: Neck supple.  Cardiovascular: Normal rate and regular rhythm.   Pulmonary/Chest: Effort normal and breath sounds normal.  Abdominal: Soft. Bowel sounds are normal.  Musculoskeletal: She exhibits tenderness.       Right shoulder: She exhibits decreased range of motion, tenderness and pain.  Arms: Neurological: She is alert and oriented to person, place, and time.  Skin: Skin is warm and dry.  Psychiatric: She has a normal mood and affect.  Nursing note and vitals reviewed.   ED Course  Procedures (including critical care time) Labs Review Labs Reviewed - No data to display  Imaging Review Dg Shoulder Right  09/23/2015  CLINICAL DATA:  Fall.  Right shoulder pain. EXAM: RIGHT SHOULDER - 2+ VIEW COMPARISON:  None. FINDINGS: There is a comminuted nondisplaced right proximal humerus fracture involving the greater tuberosity and surgical neck, and with likely involvement of the articular surface of the right humeral head. No dislocation  at the right glenohumeral joint. Right acromioclavicular joint appears intact. No suspicious focal osseous lesion. IMPRESSION: Comminuted nondisplaced right proximal humerus fracture involving the greater tuberosity and surgical neck and likely involving the articular surface of the right humeral head. Electronically Signed   By: Ilona Sorrel M.D.   On: 09/23/2015 23:21   Dg Humerus Right  09/23/2015  CLINICAL DATA:  [place] onto concrete EXAM: RIGHT HUMERUS - 2+ VIEW COMPARISON:  None. FINDINGS: There is a proximal right humeral fracture, well aligned and minimally displaced. No dislocation. Remainder of the humerus is intact. IMPRESSION: Proximal right humeral fracture Electronically Signed   By: Andreas Newport M.D.   On: 09/23/2015 23:12   I have personally reviewed and evaluated these images and lab results as part of my medical decision-making.   EKG Interpretation None     Patient discussed with and seen by Dr. Eulis Foster. Radiology results reviewed and shared with patient.  MDM   Final diagnoses:  None    Comminuted non-displaced right proximal humerus fracture. Shoulder immobilizer. Analgesic. Follow-up with orthopedics. Return precautions discussed.    Etta Quill, NP 09/24/15 [id number]  Daleen Bo, MD 09/24/15 [phone]

## 2015-09-23 NOTE — Discharge Instructions (Signed)
Shoulder Fracture (Proximal Humerus or Glenoid) A shoulder fracture is a broken upper arm bone or a broken socket bone. The humerus is the upper arm bone and the glenoid is the shoulder socket. Proximal means the humerus is broken near the shoulder. Most of the time the bones of a broken shoulder are in an acceptable position. Usually, the injury can be treated with a shoulder immobilizer or sling and swath bandage. These devices support the arm and prevent any shoulder movement. If the bones are not in a good position, then surgery is sometimes needed. Shoulder fractures usually initially cause swelling, pain, and discoloration around the upper arm. They heal in 8 to 12 weeks with proper treatment. SYMPTOMS  At the time of injury:  Pain.  Tenderness.  Regular body contours are not normal. Later symptoms may include:  Swelling and bruising of the elbow and hand.  Swelling and bruising of the arm or chest. Other symptoms include:  Pain when lifting or turning the arm.  Paralysis below the fracture.  Numbness or coldness below the fracture. CAUSES   Indirect force from falling on an outstretched arm.  A blow to the shoulder. RISK INCREASES WITH:  Not being in shape.  Playing contact sports, such as football, soccer, hockey, or rugby.  Sports where falling on an outstretched arm occurs, such as basketball, skateboarding, or volleyball.  History of bone or joint disease.  History of shoulder injury. PREVENTION  Warm up before activity.  Stretch before activity.  Stay in shape with your:  Heart fitness.  Flexibility.  Shoulder Strength.  Falling with the proper technique. PROGNOSIS  In adults, healing time is about 7 weeks. For children, healing time is about 5 weeks. Surgery may be needed. RELATED COMPLICATIONS  The bones do not heal together (nonunion).  The bones do not align properly when they heal (malunion).  Long-term problems with pain, stiffness,  swelling, or loss of motion.  The injured arm heals shorter than the other.  Nerves are injured in the arm.  Arthritis in the shoulder.  Normal bone growth is interrupted in children.  Blood supply to the shoulder joint is diminished. TREATMENT If the bones are aligned, then initial treatment will be with ice and medicine to help with pain. The shoulder will be held in place with a sling (immobilization). The shoulder will be allowed to heal for up to 6 weeks. Injuries that may need surgery include:  Severe fractures.  Fractures that are not in appropriate alignment (displaced).  Non-displaced fractures (not common). Surgery helps the bones align correctly. The bones may be held in place with:  Sutures.  Wires.  Rods.  Plates.  Screws.  Pins. If you have had surgery or not, you will likely be assisted by a physical therapist or athletic trainer to get the best results with your injured shoulder. This will likely include exercises to strengthen and stretch the injured and surrounding areas. MEDICATION  If pain medicine is needed, nonsteroidal anti-inflammatory medicines (such as aspirin or ibuprofen) or other minor pain relievers (such as acetaminophen) are often advised.  Do not take pain medicine for 7 days before surgery.  Stronger pain relievers may be prescribed. Use only as directed and take only as much as you need. COLD THERAPY Cold treatment (icing) relieves pain and reduces inflammation. Cold treatment should be applied for 10 to 15 minutes every 2 to 3 hours, and immediately after activity that aggravates your symptoms. Use ice packs or an ice massage. SEEK IMMEDIATE  MEDICAL CARE IF:  You have severe shoulder pain unrelieved by rest and taking pain medicine.  You have pain, numbness, tingling, or weakness in the hand or wrist.  You have shortness of breath, chest pain, severe weakness, or fainting.  You have severe pain with motion of the fingers or  wrist.  Blue, gray, or dark color appears in the fingernails on injured extremity.   This information is not intended to replace advice given to you by your health care provider. Make sure you discuss any questions you have with your health care provider.   Document Released: 10/08/2005 Document Revised: 12/31/2011 Document Reviewed: 01/20/2009 Elsevier Interactive Patient Education [employer].

## 2015-09-26 ENCOUNTER — Other Ambulatory Visit: Payer: Self-pay | Admitting: Orthopedic Surgery

## 2015-09-26 DIAGNOSIS — M25511 Pain in right shoulder: Secondary | ICD-10-CM

## 2015-09-26 DIAGNOSIS — S42254A Nondisplaced fracture of greater tuberosity of right humerus, initial encounter for closed fracture: Secondary | ICD-10-CM | POA: Diagnosis not present

## 2015-09-27 ENCOUNTER — Ambulatory Visit
Admission: RE | Admit: 2015-09-27 | Discharge: 2015-09-27 | Disposition: A | Discharge status: Home / Self Care | Payer: Commercial Managed Care - HMO | Source: Ambulatory Visit | Attending: Orthopedic Surgery | Admitting: Orthopedic Surgery

## 2015-09-27 DIAGNOSIS — S42211A Unspecified displaced fracture of surgical neck of right humerus, initial encounter for closed fracture: Secondary | ICD-10-CM | POA: Diagnosis not present

## 2015-09-27 DIAGNOSIS — M25511 Pain in right shoulder: Secondary | ICD-10-CM

## 2015-09-28 DIAGNOSIS — S42254D Nondisplaced fracture of greater tuberosity of right humerus, subsequent encounter for fracture with routine healing: Secondary | ICD-10-CM | POA: Diagnosis not present

## 2015-10-05 ENCOUNTER — Telehealth: Payer: Self-pay | Admitting: Gastroenterology

## 2015-10-05 DIAGNOSIS — S42254D Nondisplaced fracture of greater tuberosity of right humerus, subsequent encounter for fracture with routine healing: Secondary | ICD-10-CM | POA: Diagnosis not present

## 2015-10-05 NOTE — Telephone Encounter (Signed)
Received GI records from Dr. Earlean Shawl office. Patient is requesting to see Dr. Silverio Decamp. Records placed on Dr. Woodward Ku desk for review.

## 2015-10-18 DIAGNOSIS — S42254D Nondisplaced fracture of greater tuberosity of right humerus, subsequent encounter for fracture with routine healing: Secondary | ICD-10-CM | POA: Diagnosis not present

## 2015-10-20 NOTE — Telephone Encounter (Signed)
Dr. Silverio Decamp reviewed records and has accepted patient. Ok to schedule Direct Colon. I spoke to patient and she states that she is in [place] and will call back to schedule. She also states that she broke her shoulder earlier this month and would like to wait until she is done with physical therapy.

## 2015-11-08 DIAGNOSIS — S42254D Nondisplaced fracture of greater tuberosity of right humerus, subsequent encounter for fracture with routine healing: Secondary | ICD-10-CM | POA: Diagnosis not present

## 2015-11-09 DIAGNOSIS — M25511 Pain in right shoulder: Secondary | ICD-10-CM | POA: Diagnosis not present

## 2015-11-09 DIAGNOSIS — S42254D Nondisplaced fracture of greater tuberosity of right humerus, subsequent encounter for fracture with routine healing: Secondary | ICD-10-CM | POA: Diagnosis not present

## 2015-11-09 DIAGNOSIS — M25611 Stiffness of right shoulder, not elsewhere classified: Secondary | ICD-10-CM | POA: Diagnosis not present

## 2015-11-09 DIAGNOSIS — M6281 Muscle weakness (generalized): Secondary | ICD-10-CM | POA: Diagnosis not present

## 2015-11-11 DIAGNOSIS — M6281 Muscle weakness (generalized): Secondary | ICD-10-CM | POA: Diagnosis not present

## 2015-11-11 DIAGNOSIS — M25511 Pain in right shoulder: Secondary | ICD-10-CM | POA: Diagnosis not present

## 2015-11-11 DIAGNOSIS — S42254D Nondisplaced fracture of greater tuberosity of right humerus, subsequent encounter for fracture with routine healing: Secondary | ICD-10-CM | POA: Diagnosis not present

## 2015-11-11 DIAGNOSIS — M25611 Stiffness of right shoulder, not elsewhere classified: Secondary | ICD-10-CM | POA: Diagnosis not present

## 2015-11-14 ENCOUNTER — Encounter: Payer: Self-pay | Admitting: Gastroenterology

## 2015-11-15 DIAGNOSIS — M25511 Pain in right shoulder: Secondary | ICD-10-CM | POA: Diagnosis not present

## 2015-11-15 DIAGNOSIS — M25611 Stiffness of right shoulder, not elsewhere classified: Secondary | ICD-10-CM | POA: Diagnosis not present

## 2015-11-15 DIAGNOSIS — M6281 Muscle weakness (generalized): Secondary | ICD-10-CM | POA: Diagnosis not present

## 2015-11-15 DIAGNOSIS — S42254D Nondisplaced fracture of greater tuberosity of right humerus, subsequent encounter for fracture with routine healing: Secondary | ICD-10-CM | POA: Diagnosis not present

## 2015-11-17 DIAGNOSIS — M6281 Muscle weakness (generalized): Secondary | ICD-10-CM | POA: Diagnosis not present

## 2015-11-17 DIAGNOSIS — M25511 Pain in right shoulder: Secondary | ICD-10-CM | POA: Diagnosis not present

## 2015-11-17 DIAGNOSIS — S42254D Nondisplaced fracture of greater tuberosity of right humerus, subsequent encounter for fracture with routine healing: Secondary | ICD-10-CM | POA: Diagnosis not present

## 2015-11-17 DIAGNOSIS — E039 Hypothyroidism, unspecified: Secondary | ICD-10-CM | POA: Diagnosis not present

## 2015-11-17 DIAGNOSIS — M25611 Stiffness of right shoulder, not elsewhere classified: Secondary | ICD-10-CM | POA: Diagnosis not present

## 2015-11-21 DIAGNOSIS — M25611 Stiffness of right shoulder, not elsewhere classified: Secondary | ICD-10-CM | POA: Diagnosis not present

## 2015-11-21 DIAGNOSIS — M6281 Muscle weakness (generalized): Secondary | ICD-10-CM | POA: Diagnosis not present

## 2015-11-21 DIAGNOSIS — M25511 Pain in right shoulder: Secondary | ICD-10-CM | POA: Diagnosis not present

## 2015-11-21 DIAGNOSIS — S42254D Nondisplaced fracture of greater tuberosity of right humerus, subsequent encounter for fracture with routine healing: Secondary | ICD-10-CM | POA: Diagnosis not present

## 2015-11-22 DIAGNOSIS — E890 Postprocedural hypothyroidism: Secondary | ICD-10-CM | POA: Diagnosis not present

## 2015-11-24 DIAGNOSIS — M25511 Pain in right shoulder: Secondary | ICD-10-CM | POA: Diagnosis not present

## 2015-11-24 DIAGNOSIS — M6281 Muscle weakness (generalized): Secondary | ICD-10-CM | POA: Diagnosis not present

## 2015-11-24 DIAGNOSIS — M25611 Stiffness of right shoulder, not elsewhere classified: Secondary | ICD-10-CM | POA: Diagnosis not present

## 2015-11-24 DIAGNOSIS — S42254D Nondisplaced fracture of greater tuberosity of right humerus, subsequent encounter for fracture with routine healing: Secondary | ICD-10-CM | POA: Diagnosis not present

## 2015-11-25 DIAGNOSIS — E038 Other specified hypothyroidism: Secondary | ICD-10-CM | POA: Diagnosis not present

## 2015-11-28 DIAGNOSIS — M25511 Pain in right shoulder: Secondary | ICD-10-CM | POA: Diagnosis not present

## 2015-11-28 DIAGNOSIS — M25611 Stiffness of right shoulder, not elsewhere classified: Secondary | ICD-10-CM | POA: Diagnosis not present

## 2015-11-28 DIAGNOSIS — M6281 Muscle weakness (generalized): Secondary | ICD-10-CM | POA: Diagnosis not present

## 2015-11-28 DIAGNOSIS — S42254D Nondisplaced fracture of greater tuberosity of right humerus, subsequent encounter for fracture with routine healing: Secondary | ICD-10-CM | POA: Diagnosis not present

## 2015-11-30 DIAGNOSIS — M6281 Muscle weakness (generalized): Secondary | ICD-10-CM | POA: Diagnosis not present

## 2015-11-30 DIAGNOSIS — M25511 Pain in right shoulder: Secondary | ICD-10-CM | POA: Diagnosis not present

## 2015-11-30 DIAGNOSIS — M25611 Stiffness of right shoulder, not elsewhere classified: Secondary | ICD-10-CM | POA: Diagnosis not present

## 2015-11-30 DIAGNOSIS — S42254D Nondisplaced fracture of greater tuberosity of right humerus, subsequent encounter for fracture with routine healing: Secondary | ICD-10-CM | POA: Diagnosis not present

## 2015-12-01 DIAGNOSIS — S42254D Nondisplaced fracture of greater tuberosity of right humerus, subsequent encounter for fracture with routine healing: Secondary | ICD-10-CM | POA: Diagnosis not present

## 2015-12-01 DIAGNOSIS — M25511 Pain in right shoulder: Secondary | ICD-10-CM | POA: Diagnosis not present

## 2015-12-22 ENCOUNTER — Ambulatory Visit (AMBULATORY_SURGERY_CENTER): Payer: Self-pay | Admitting: *Deleted

## 2015-12-22 VITALS — Ht 62.5 in | Wt 112.0 lb

## 2015-12-22 DIAGNOSIS — Z86010 Personal history of colonic polyps: Secondary | ICD-10-CM

## 2015-12-22 MED ORDER — NA SULFATE-K SULFATE-MG SULF 17.5-3.13-1.6 GM/177ML PO SOLN: 1.0000 | Freq: Once | ORAL | Status: DC

## 2015-12-22 NOTE — Progress Notes (Signed)
No egg or soy allergy. No anesthesia problems.  No home O2.  No diet meds.  Pt is worried about her size and the effects of anesthesia.

## 2016-01-05 ENCOUNTER — Ambulatory Visit (AMBULATORY_SURGERY_CENTER): Payer: Commercial Managed Care - HMO | Admitting: Gastroenterology

## 2016-01-05 ENCOUNTER — Encounter: Payer: Self-pay | Admitting: Gastroenterology

## 2016-01-05 VITALS — BP 121/55 | HR 46 | Temp 98.6°F | Resp 10 | Ht 62.0 in | Wt 112.0 lb

## 2016-01-05 DIAGNOSIS — Z86010 Personal history of colonic polyps: Secondary | ICD-10-CM | POA: Diagnosis not present

## 2016-01-05 DIAGNOSIS — Z1211 Encounter for screening for malignant neoplasm of colon: Secondary | ICD-10-CM | POA: Diagnosis not present

## 2016-01-05 MED ORDER — SODIUM CHLORIDE 0.9 % IV SOLN: 500.0000 mL | INTRAVENOUS | Status: DC

## 2016-01-05 NOTE — Progress Notes (Signed)
No problems noted in the recovery room. maw 

## 2016-01-05 NOTE — Patient Instructions (Signed)
YOU HAD AN ENDOSCOPIC PROCEDURE TODAY AT [facility]:   Refer to the procedure report that was given to you for any specific questions about what was found during the examination.  If the procedure report does not answer your questions, please call your gastroenterologist to clarify.  If you requested that your care partner not be given the details of your procedure findings, then the procedure report has been included in a sealed envelope for you to review at your convenience later.  YOU SHOULD EXPECT: Some feelings of bloating in the abdomen. Passage of more gas than usual.  Walking can help get rid of the air that was put into your GI tract during the procedure and reduce the bloating. If you had a lower endoscopy (such as a colonoscopy or flexible sigmoidoscopy) you may notice spotting of blood in your stool or on the toilet paper. If you underwent a bowel prep for your procedure, you may not have a normal bowel movement for a few days.  Please Note:  You might notice some irritation and congestion in your nose or some drainage.  This is from the oxygen used during your procedure.  There is no need for concern and it should clear up in a day or so.  SYMPTOMS TO REPORT IMMEDIATELY:   Following lower endoscopy (colonoscopy or flexible sigmoidoscopy):  Excessive amounts of blood in the stool  Significant tenderness or worsening of abdominal pains  Swelling of the abdomen that is new, acute  Fever of 100F or higher   For urgent or emergent issues, a gastroenterologist can be reached at any hour by calling [phone].   DIET: Your first meal following the procedure should be a small meal and then it is ok to progress to your normal diet. Heavy or fried foods are harder to digest and may make you feel nauseous or bloated.  Likewise, meals heavy in dairy and vegetables can increase bloating.  Drink plenty of fluids but you should avoid alcoholic beverages for 24  hours.  ACTIVITY:  You should plan to take it easy for the rest of today and you should NOT DRIVE or use heavy machinery until tomorrow (because of the sedation medicines used during the test).    FOLLOW UP: Our staff will call the number listed on your records the next business day following your procedure to check on you and address any questions or concerns that you may have regarding the information given to you following your procedure. If we do not reach you, we will leave a message.  However, if you are feeling well and you are not experiencing any problems, there is no need to return our call.  We will assume that you have returned to your regular daily activities without incident.  If any biopsies were taken you will be contacted by phone or by letter within the next 1-3 weeks.  Please call us at [phone] if you have not heard about the biopsies in 3 weeks.    SIGNATURES/CONFIDENTIALITY: You and/or your care partner have signed paperwork which will be entered into your electronic medical record.  These signatures attest to the fact that that the information above on your After Visit Summary has been reviewed and is understood.  Full responsibility of the confidentiality of this discharge information lies with you and/or your care-partner.    Handouts were given to your care partner on hemorrhoids, diverticulosis, and a high fiber diet with liberal fluid intake. You may resume  your current medications today. A repeat colonoscopy is not recommended due to your age. Please call if any questions or concerns.

## 2016-01-05 NOTE — Progress Notes (Signed)
A and O x 3 Report to [employer]

## 2016-01-05 NOTE — Op Note (Signed)
[facility] Patient Name: Glenda Perez Procedure Date: 01/05/2016 11:28 AM MRN: [id number] Endoscopist: Mauri Pole , MD Age: 75 Referring MD:  Date of Birth: 1940-11-25 Gender: Female Procedure:                Colonoscopy Indications:              Surveillance: Personal history of adenomatous                            polyps on last colonoscopy 5 years ago, High risk                            colon cancer surveillance: Personal history of                            adenoma less than 10 mm in size Medicines:                Propofol total dose 200 mg IV, Monitored Anesthesia                            Care Procedure:                Pre-Anesthesia Assessment:                           - Prior to the procedure, a History and Physical                            was performed, and patient medications and                            allergies were reviewed. The patient's tolerance of                            previous anesthesia was also reviewed. The risks                            and benefits of the procedure and the sedation                            options and risks were discussed with the patient.                            All questions were answered, and informed consent                            was obtained. Prior Anticoagulants: The patient has                            taken no previous anticoagulant or antiplatelet                            agents. ASA Grade Assessment: II - A patient with  mild systemic disease. After reviewing the risks                            and benefits, the patient was deemed in                            satisfactory condition to undergo the procedure.                           After obtaining informed consent, the colonoscope                            was passed under direct vision. Throughout the                            procedure, the patient's blood pressure, pulse, and           oxygen saturations were monitored continuously. The                            Model PCF-H190L [id number]) scope was introduced                            through the anus and advanced to the the cecum,                            identified by appendiceal orifice and ileocecal                            valve. The colonoscopy was performed without                            difficulty. The patient tolerated the procedure                            well. The quality of the bowel preparation was                            good. The ileocecal valve, appendiceal orifice, and                            rectum were photographed. The quality of the bowel                            preparation was evaluated using the BBPS [facility]                            Bowel Preparation Scale) with scores of: Right                            Colon = 3 (entire mucosa seen well with no residual                            staining, small fragments of stool or  opaque                            liquid), Transverse Colon = 3 (entire mucosa seen                            well with no residual staining, small fragments of                            stool or opaque liquid) and Left Colon = 3 (entire                            mucosa seen well with no residual staining, small                            fragments of stool or opaque liquid). The total                            BBPS score equals 9. Scope In: 11:35:54 AM Scope Out: 11:51:37 AM Scope Withdrawal Time: 0 hours 11 minutes 40 seconds  Total Procedure Duration: 0 hours 15 minutes 43 seconds  Findings:      The perianal and digital rectal examinations were normal.      A few small-mouthed diverticula were found in the sigmoid colon,       descending colon and transverse colon. There was no evidence of       diverticular bleeding.      Non-bleeding internal hemorrhoids were found during retroflexion. The       hemorrhoids were small. Complications:             No immediate complications. Estimated Blood Loss:     Estimated blood loss: none. Impression:               - Mild diverticulosis in the sigmoid colon, in the                            descending colon and in the transverse colon. There                            was no evidence of diverticular bleeding.                           - Non-bleeding internal hemorrhoids.                           - No specimens collected. Recommendation:           - Patient has a contact number available for                            emergencies. The signs and symptoms of potential                            delayed complications were discussed with the  patient. Return to normal activities tomorrow.                            Written discharge instructions were provided to the                            patient.                           - Resume previous diet.                           - Continue present medications.                           - No repeat colonoscopy due to age.                           - Return to GI clinic PRN. Procedure Code(s):        --- Professional ---                           [id number], Colorectal cancer screening; colonoscopy on                            individual at high risk CPT copyright 2016 American Medical Association. All rights reserved. Mauri Pole, MD 01/05/2016 11:58:56 AM This report has been signed electronically. Number of Addenda: 0

## 2016-01-06 ENCOUNTER — Telehealth: Payer: Self-pay

## 2016-01-06 NOTE — Telephone Encounter (Signed)
  Follow up Call-  Call back number 01/05/2016  Post procedure Call Back phone  # [phone] hm  Permission to leave phone message Yes     Patient questions:  Do you have a fever, pain , or abdominal swelling? No. Pain Score  0 *  Have you tolerated food without any problems? Yes.    Have you been able to return to your normal activities? Yes.    Do you have any questions about your discharge instructions: Diet   No. Medications  No. Follow up visit  No.  Do you have questions or concerns about your Care? No.  Actions: * If pain score is 4 or above: No action needed, pain <4.

## 2016-01-11 DIAGNOSIS — M25511 Pain in right shoulder: Secondary | ICD-10-CM | POA: Diagnosis not present

## 2016-02-08 DIAGNOSIS — M810 Age-related osteoporosis without current pathological fracture: Secondary | ICD-10-CM | POA: Diagnosis not present

## 2016-02-08 DIAGNOSIS — E7800 Pure hypercholesterolemia, unspecified: Secondary | ICD-10-CM | POA: Diagnosis not present

## 2016-02-08 DIAGNOSIS — E039 Hypothyroidism, unspecified: Secondary | ICD-10-CM | POA: Diagnosis not present

## 2016-02-08 DIAGNOSIS — Z0000 Encounter for general adult medical examination without abnormal findings: Secondary | ICD-10-CM | POA: Diagnosis not present

## 2016-02-13 DIAGNOSIS — Z86010 Personal history of colonic polyps: Secondary | ICD-10-CM | POA: Diagnosis not present

## 2016-02-13 DIAGNOSIS — E039 Hypothyroidism, unspecified: Secondary | ICD-10-CM | POA: Diagnosis not present

## 2016-02-13 DIAGNOSIS — E7800 Pure hypercholesterolemia, unspecified: Secondary | ICD-10-CM | POA: Diagnosis not present

## 2016-02-13 DIAGNOSIS — Z8639 Personal history of other endocrine, nutritional and metabolic disease: Secondary | ICD-10-CM | POA: Diagnosis not present

## 2016-02-13 DIAGNOSIS — M810 Age-related osteoporosis without current pathological fracture: Secondary | ICD-10-CM | POA: Diagnosis not present

## 2016-02-13 DIAGNOSIS — Z0000 Encounter for general adult medical examination without abnormal findings: Secondary | ICD-10-CM | POA: Diagnosis not present

## 2016-02-13 DIAGNOSIS — E0500 Thyrotoxicosis with diffuse goiter without thyrotoxic crisis or storm: Secondary | ICD-10-CM | POA: Diagnosis not present

## 2016-02-13 DIAGNOSIS — F419 Anxiety disorder, unspecified: Secondary | ICD-10-CM | POA: Diagnosis not present

## 2016-02-13 DIAGNOSIS — Z1389 Encounter for screening for other disorder: Secondary | ICD-10-CM | POA: Diagnosis not present

## 2016-05-15 DIAGNOSIS — E0500 Thyrotoxicosis with diffuse goiter without thyrotoxic crisis or storm: Secondary | ICD-10-CM | POA: Diagnosis not present

## 2016-05-15 DIAGNOSIS — Z880 Allergy status to penicillin: Secondary | ICD-10-CM | POA: Diagnosis not present

## 2016-05-15 DIAGNOSIS — H11423 Conjunctival edema, bilateral: Secondary | ICD-10-CM | POA: Diagnosis not present

## 2016-05-15 DIAGNOSIS — H04129 Dry eye syndrome of unspecified lacrimal gland: Secondary | ICD-10-CM | POA: Diagnosis not present

## 2016-05-15 DIAGNOSIS — Z79899 Other long term (current) drug therapy: Secondary | ICD-10-CM | POA: Diagnosis not present

## 2016-05-15 DIAGNOSIS — G7289 Other specified myopathies: Secondary | ICD-10-CM | POA: Diagnosis not present

## 2016-05-15 DIAGNOSIS — H2513 Age-related nuclear cataract, bilateral: Secondary | ICD-10-CM | POA: Diagnosis not present

## 2016-07-28 DIAGNOSIS — J069 Acute upper respiratory infection, unspecified: Secondary | ICD-10-CM | POA: Diagnosis not present

## 2016-08-30 DIAGNOSIS — E0500 Thyrotoxicosis with diffuse goiter without thyrotoxic crisis or storm: Secondary | ICD-10-CM | POA: Diagnosis not present

## 2016-08-30 DIAGNOSIS — E890 Postprocedural hypothyroidism: Secondary | ICD-10-CM | POA: Diagnosis not present

## 2016-09-11 DIAGNOSIS — J019 Acute sinusitis, unspecified: Secondary | ICD-10-CM | POA: Diagnosis not present

## 2016-09-11 DIAGNOSIS — F419 Anxiety disorder, unspecified: Secondary | ICD-10-CM | POA: Diagnosis not present

## 2016-09-27 DIAGNOSIS — M810 Age-related osteoporosis without current pathological fracture: Secondary | ICD-10-CM | POA: Diagnosis not present

## 2016-09-27 DIAGNOSIS — Z1231 Encounter for screening mammogram for malignant neoplasm of breast: Secondary | ICD-10-CM | POA: Diagnosis not present

## 2016-10-03 DIAGNOSIS — B351 Tinea unguium: Secondary | ICD-10-CM | POA: Diagnosis not present

## 2016-10-03 DIAGNOSIS — D229 Melanocytic nevi, unspecified: Secondary | ICD-10-CM | POA: Diagnosis not present

## 2016-10-03 DIAGNOSIS — L821 Other seborrheic keratosis: Secondary | ICD-10-CM | POA: Diagnosis not present

## 2016-11-01 DIAGNOSIS — M810 Age-related osteoporosis without current pathological fracture: Secondary | ICD-10-CM | POA: Diagnosis not present

## 2016-11-01 DIAGNOSIS — E039 Hypothyroidism, unspecified: Secondary | ICD-10-CM | POA: Diagnosis not present

## 2016-11-01 DIAGNOSIS — E0500 Thyrotoxicosis with diffuse goiter without thyrotoxic crisis or storm: Secondary | ICD-10-CM | POA: Diagnosis not present

## 2016-11-01 DIAGNOSIS — Z8639 Personal history of other endocrine, nutritional and metabolic disease: Secondary | ICD-10-CM | POA: Diagnosis not present

## 2016-11-29 DIAGNOSIS — R110 Nausea: Secondary | ICD-10-CM | POA: Diagnosis not present

## 2016-11-29 DIAGNOSIS — S161XXA Strain of muscle, fascia and tendon at neck level, initial encounter: Secondary | ICD-10-CM | POA: Diagnosis not present

## 2016-12-05 DIAGNOSIS — J069 Acute upper respiratory infection, unspecified: Secondary | ICD-10-CM | POA: Diagnosis not present

## 2016-12-17 DIAGNOSIS — R1031 Right lower quadrant pain: Secondary | ICD-10-CM | POA: Diagnosis not present

## 2016-12-21 DIAGNOSIS — R1031 Right lower quadrant pain: Secondary | ICD-10-CM | POA: Diagnosis not present

## 2016-12-25 ENCOUNTER — Other Ambulatory Visit: Payer: Self-pay | Admitting: Surgery

## 2016-12-25 DIAGNOSIS — R1031 Right lower quadrant pain: Secondary | ICD-10-CM

## 2016-12-26 ENCOUNTER — Ambulatory Visit
Admission: RE | Admit: 2016-12-26 | Discharge: 2016-12-26 | Disposition: A | Discharge status: Home / Self Care | Payer: Commercial Managed Care - HMO | Source: Ambulatory Visit | Attending: Surgery | Admitting: Surgery

## 2016-12-26 DIAGNOSIS — R1031 Right lower quadrant pain: Secondary | ICD-10-CM | POA: Diagnosis not present

## 2016-12-26 MED ORDER — IOPAMIDOL (ISOVUE-300) INJECTION 61%
100.0000 mL | Freq: Once | INTRAVENOUS | Status: AC | PRN
  Administered 2016-12-26: 100 mL via INTRAVENOUS

## 2017-03-21 ENCOUNTER — Ambulatory Visit: Payer: Commercial Managed Care - HMO | Admitting: Internal Medicine

## 2017-04-04 ENCOUNTER — Encounter: Payer: Self-pay | Admitting: Internal Medicine

## 2017-04-04 ENCOUNTER — Ambulatory Visit (INDEPENDENT_AMBULATORY_CARE_PROVIDER_SITE_OTHER): Payer: Medicare HMO | Admitting: Internal Medicine

## 2017-04-04 VITALS — BP 110/60 | HR 57 | Ht 63.0 in | Wt 108.0 lb

## 2017-04-04 DIAGNOSIS — E890 Postprocedural hypothyroidism: Secondary | ICD-10-CM

## 2017-04-04 DIAGNOSIS — E0500 Thyrotoxicosis with diffuse goiter without thyrotoxic crisis or storm: Secondary | ICD-10-CM | POA: Diagnosis not present

## 2017-04-04 DIAGNOSIS — Z8639 Personal history of other endocrine, nutritional and metabolic disease: Secondary | ICD-10-CM

## 2017-04-04 NOTE — Patient Instructions (Addendum)
Please continue the current Levothyroxine dose.  Take the thyroid hormone every day, with water, at least 30 minutes before breakfast, separated by at least 4 hours from: - acid reflux medications - calcium - iron - multivitamins  Increase Selenium to 200 mcg daily.  Please come back for labs in 5-6 weeks.  Please come back for a follow-up appointment in 6 months.

## 2017-04-04 NOTE — Progress Notes (Addendum)
Patient ID: Glenda Perez, female   DOB: October 02, 1941, 76 y.o.   MRN: [id number]    HPI  Glenda Perez is a 76 y.o.-year-old femaler-old female, referred by her PCP, Dr. Drema Dallas for management of postablative hypothyroidism. She prev. Saw Dr. Lonna Duval at [facility] (notes reviewed). She prev. saw Dr. Tobe Sos and Dr. Buddy Duty.  Pt. has been dx with hypothyroidism in 2011 after she had RAI ablation for Graves ds. >> on Levothyroxine (initially 100 mcg).  She now takes LT4 ~85 mcg/day (75 mcg 6/7 days, 150 mcg 1/7 days) - last dose change 08/2016: - at bedtime - with water - separated by dinner by 3 hours - + calcium in am - no iron - no PPIs - no multivitamins   She is on Selenium 100 mcg daily for several years.   I reviewed pt's thyroid tests: 08/30/2016: TSH 0.704 01/11/2016: TSH 0.605 Lab Results  Component Value Date   TSH 1.076 08/19/2012   TSH 0.847 05/12/2012   TSH 1.301 02/15/2012   TSH 0.832 01/16/2012   TSH 1.517 12/17/2011   TSH 1.021 11/20/2011   TSH 1.833 10/17/2011   TSH 12.759 (H) 08/29/2011   TSH 5.378 (H) 08/06/2011   TSH 1.736 04/23/2011   FREET4 1.26 08/19/2012   FREET4 1.31 05/12/2012   FREET4 1.09 02/15/2012   FREET4 1.31 01/16/2012   FREET4 1.39 12/17/2011   FREET4 1.28 11/20/2011   FREET4 1.32 10/17/2011   FREET4 1.08 08/29/2011   FREET4 1.18 08/06/2011   FREET4 1.16 04/23/2011    09/22/2012: TSI 407  Pt describes: - weight gain - fatigue - cold intolerance - depression - constipation - dry skin - hair loss  Pt denies feeling nodules in neck, hoarseness, dysphagia/odynophagia, SOB with lying down.  She has no FH of thyroid disorders. No FH of thyroid cancer.  No h/o radiation tx to head or neck other than RAI tx. No recent use of iodine supplements.  Pt. also has a history of Graves ophthalmopathy >> seeing ophthalmology. This improved after she started taking Selenium.  She also has a h/o reactive hypoglycemia.  ROS: Constitutional: no  weight gain/loss, no fatigue, no subjective hyperthermia/hypothermia Eyes: no blurry vision, no xerophthalmia ENT: no sore throat, no nodules palpated in throat, no dysphagia/odynophagia, no hoarseness Cardiovascular: no CP/SOB/palpitations/leg swelling Respiratory: no cough/SOB Gastrointestinal: no N/V/D/C Musculoskeletal: no muscle/joint aches Skin: no rashes Neurological: no tremors/numbness/tingling/dizziness Psychiatric: no depression/anxiety  Past Medical History:  Diagnosis Date  . Anxiety    clonezapam  . Osteopenia   . Thyroid disease    Past Surgical History:  Procedure Laterality Date  . BIOPSY BREAST    . COLONOSCOPY    . OOPHORECTOMY    . UPPER GASTROINTESTINAL ENDOSCOPY     Social History   Social History  . Marital status: Widowed    Spouse name: N/A  . Number of children: 1   Occupational History  . artist   Social History Main Topics  . Smoking status: Never Smoker  . Smokeless tobacco: Never Used  . Alcohol use     3-5 Glasses of wine per week  . Drug use: No   Current Outpatient Prescriptions on File Prior to Visit  Medication Sig Dispense Refill  . cholecalciferol (VITAMIN D) 1000 UNITS tablet Take 1,000 Units by mouth daily.    . clonazePAM (KLONOPIN) 0.5 MG tablet Take 0.5 mg by mouth at bedtime.     . fish oil-omega-3 fatty acids 1000 MG capsule Take 1 g by mouth daily.      Marland Kitchen  Melatonin 1 MG TABS Take 3 mg by mouth at bedtime.     . Selenium 100 MCG CAPS Take 100 mcg by mouth 2 (two) times daily.    Marland Kitchen SYNTHROID 75 MCG tablet take 1 tablet by mouth once daily 31 tablet 4  . TURMERIC PO Take 100 mg by mouth daily.    . vitamin C (ASCORBIC ACID) 500 MG tablet Take 500 mg by mouth daily.    . Magnesium 250 MG TABS Take 250 mg by mouth at bedtime.    . Multiple Vitamin (MULTIVITAMIN) tablet Take 1 tablet by mouth daily.       No current facility-administered medications on file prior to visit.    Allergies  Allergen Reactions  . Amoxicillin  Rash    Has patient had a PCN reaction causing immediate rash, facial/tongue/throat swelling, SOB or lightheadedness with hypotension:No Has patient had a PCN reaction causing severe rash involving mucus membranes or skin necrosis:No Has patient had a PCN reaction that required hospitalization:No Has patient had a PCN reaction occurring within the last 10 years:Yes If all of the above answers are "NO", then may proceed with Cephalosporin use.   . Other Rash    All cillins   Family History  Problem Relation Age of Onset  . Colon cancer Neg Hx   . Esophageal cancer Neg Hx   . Pancreatic cancer Neg Hx   . Rectal cancer Neg Hx   . Stomach cancer Neg Hx     PE: BP 110/60 (BP Location: Left Arm, Patient Position: Sitting)   Pulse (!) 57   Ht 5\' 3"  (1.6 m)   Wt 108 lb (49 kg)   SpO2 99%   BMI 19.13 kg/m  Wt Readings from Last 3 Encounters:  04/04/17 108 lb (49 kg)  01/05/16 112 lb (50.8 kg)  12/22/15 112 lb (50.8 kg)   Constitutional: thin,  in NAD Eyes: PERRLA, EOMI, + B mild exophthalmos and + stare ENT: moist mucous membranes, no thyromegaly, no cervical lymphadenopathy Cardiovascular: RRR, No MRG Respiratory: CTA B Gastrointestinal: abdomen soft, NT, ND, BS+ Musculoskeletal: no deformities, strength intact in all 4 Skin: moist, warm, no rashes Neurological: no tremor with outstretched hands, DTR normal in all 4  ASSESSMENT: 1. Postablative Hypothyroidism - h/o Graves ds.  2. Graves ophthalmopathy (GO)  PLAN:  1. Patient with long-standing hypothyroidism, on levothyroxine therapy:: ~85 mcg daily - she appears euthyroid. She feels good on this dose.  - she does not appear to have a goiter, thyroid nodules, or neck compression symptoms - We discussed about correct intake of levothyroxine, fasting, with water, separated by at least 30 minutes from breakfast, and separated by more than 4 hours from calcium, iron, multivitamins, acid reflux medications (PPIs). She is taking  the LT4 at bedtime >> discussed to move this in am, esp. Since she does not sleep well at night. She will move calcium at night. - will check thyroid tests in 5-6 weeks after she makes the above change:  Orders Placed This Encounter  Procedures  . T4, free  . T3, free  . TSH  . Thyroid Stimulating Immunoglobulin  - I will see her back in 6 months  2. GO - mild sxs now, but has a h/o double vision in the past >> improved after started Selenium - will increase Se to 200 mcg daily - will check TSI - she has appt with ophthalm. In 05/2017 >> will get the results until then  Philemon Kingdom, MD  PhD [facility]

## 2017-04-06 ENCOUNTER — Encounter: Payer: Self-pay | Admitting: Internal Medicine

## 2017-05-06 DIAGNOSIS — M810 Age-related osteoporosis without current pathological fracture: Secondary | ICD-10-CM | POA: Diagnosis not present

## 2017-05-06 DIAGNOSIS — R5383 Other fatigue: Secondary | ICD-10-CM | POA: Diagnosis not present

## 2017-05-06 DIAGNOSIS — F419 Anxiety disorder, unspecified: Secondary | ICD-10-CM | POA: Diagnosis not present

## 2017-05-06 DIAGNOSIS — E7800 Pure hypercholesterolemia, unspecified: Secondary | ICD-10-CM | POA: Diagnosis not present

## 2017-05-10 DIAGNOSIS — E0500 Thyrotoxicosis with diffuse goiter without thyrotoxic crisis or storm: Secondary | ICD-10-CM | POA: Diagnosis not present

## 2017-05-10 DIAGNOSIS — H2513 Age-related nuclear cataract, bilateral: Secondary | ICD-10-CM | POA: Diagnosis not present

## 2017-05-10 DIAGNOSIS — H5203 Hypermetropia, bilateral: Secondary | ICD-10-CM | POA: Diagnosis not present

## 2017-05-13 ENCOUNTER — Telehealth: Payer: Self-pay | Admitting: Internal Medicine

## 2017-05-13 NOTE — Telephone Encounter (Signed)
Patient would like to discuss the billing process for [facility] and having to pay a copay of $45 even though [employer] told her to only pay $10. Patient does understand that Dr. Cruzita Lederer is specialtyPlease call patient to clarify. I gave her the billing number as well.

## 2017-05-14 ENCOUNTER — Telehealth: Payer: Self-pay | Admitting: Internal Medicine

## 2017-05-14 ENCOUNTER — Telehealth: Payer: Self-pay | Admitting: Family Medicine

## 2017-05-14 NOTE — Telephone Encounter (Signed)
Unfortunately, I'm not able to accept any more new patients at this time.  I'm sorry! Thank you!  

## 2017-05-14 NOTE — Telephone Encounter (Signed)
Pt is currently seeing someone in family practice for her pcp but she is looking to transfer to internal medicine because she thinks that would be the best option for her. You were highly recomended to her and wanted to know if you would be will to take her on as a new patient. She would not need to be seen for a while because she was recently seen.

## 2017-05-14 NOTE — Telephone Encounter (Signed)
error 

## 2017-05-14 NOTE — Telephone Encounter (Signed)
LM for pt to call back.

## 2017-05-15 ENCOUNTER — Other Ambulatory Visit (INDEPENDENT_AMBULATORY_CARE_PROVIDER_SITE_OTHER): Payer: Medicare HMO

## 2017-05-15 DIAGNOSIS — E890 Postprocedural hypothyroidism: Secondary | ICD-10-CM

## 2017-05-15 LAB — TSH: TSH: 0.09 u[IU]/mL — ABNORMAL LOW (ref 0.35–4.50)

## 2017-05-15 LAB — T4, FREE: FREE T4: 1.2 ng/dL (ref 0.60–1.60)

## 2017-05-15 LAB — T3, FREE: T3 FREE: 2.6 pg/mL (ref 2.3–4.2)

## 2017-05-15 NOTE — Telephone Encounter (Signed)
LM informing pt.

## 2017-05-17 ENCOUNTER — Encounter: Payer: Self-pay | Admitting: Internal Medicine

## 2017-05-21 ENCOUNTER — Encounter: Payer: Self-pay | Admitting: Internal Medicine

## 2017-05-21 LAB — THYROID STIMULATING IMMUNOGLOBULIN: TSI: >700 % baseline — ABNORMAL HIGH (ref <140)

## 2017-05-24 NOTE — Telephone Encounter (Signed)
LM for pt to call back.

## 2017-07-25 ENCOUNTER — Other Ambulatory Visit (INDEPENDENT_AMBULATORY_CARE_PROVIDER_SITE_OTHER): Payer: Medicare HMO

## 2017-07-25 DIAGNOSIS — E890 Postprocedural hypothyroidism: Secondary | ICD-10-CM | POA: Diagnosis not present

## 2017-07-25 LAB — T4, FREE: FREE T4: 1.05 ng/dL (ref 0.60–1.60)

## 2017-07-25 LAB — TSH: TSH: 0.51 u[IU]/mL (ref 0.35–4.50)

## 2017-07-30 DIAGNOSIS — R5381 Other malaise: Secondary | ICD-10-CM | POA: Diagnosis not present

## 2017-07-30 DIAGNOSIS — R5383 Other fatigue: Secondary | ICD-10-CM | POA: Diagnosis not present

## 2017-07-30 DIAGNOSIS — E7800 Pure hypercholesterolemia, unspecified: Secondary | ICD-10-CM | POA: Diagnosis not present

## 2017-07-30 DIAGNOSIS — M810 Age-related osteoporosis without current pathological fracture: Secondary | ICD-10-CM | POA: Diagnosis not present

## 2017-08-07 ENCOUNTER — Other Ambulatory Visit: Payer: Self-pay

## 2017-08-07 ENCOUNTER — Telehealth: Payer: Self-pay | Admitting: Internal Medicine

## 2017-08-07 MED ORDER — LEVOTHYROXINE SODIUM 75 MCG PO TABS: 75.0000 ug | ORAL_TABLET | Freq: Every day | ORAL | 4 refills | Status: DC

## 2017-08-07 NOTE — Telephone Encounter (Signed)
Submitted

## 2017-08-07 NOTE — Telephone Encounter (Signed)
[employer] [facility] needs rx for levothyroxine please

## 2017-09-17 DIAGNOSIS — M810 Age-related osteoporosis without current pathological fracture: Secondary | ICD-10-CM | POA: Diagnosis not present

## 2017-09-17 DIAGNOSIS — E7800 Pure hypercholesterolemia, unspecified: Secondary | ICD-10-CM | POA: Diagnosis not present

## 2017-09-17 DIAGNOSIS — Z23 Encounter for immunization: Secondary | ICD-10-CM | POA: Diagnosis not present

## 2017-09-17 DIAGNOSIS — E039 Hypothyroidism, unspecified: Secondary | ICD-10-CM | POA: Diagnosis not present

## 2017-10-04 ENCOUNTER — Ambulatory Visit: Payer: Medicare HMO | Admitting: Internal Medicine

## 2017-10-04 ENCOUNTER — Encounter: Payer: Self-pay | Admitting: Internal Medicine

## 2017-10-04 VITALS — BP 96/60 | HR 67 | Ht 63.5 in | Wt 110.8 lb

## 2017-10-04 DIAGNOSIS — E890 Postprocedural hypothyroidism: Secondary | ICD-10-CM

## 2017-10-04 DIAGNOSIS — E0500 Thyrotoxicosis with diffuse goiter without thyrotoxic crisis or storm: Secondary | ICD-10-CM | POA: Diagnosis not present

## 2017-10-04 LAB — TSH: TSH: 2 u[IU]/mL (ref 0.35–4.50)

## 2017-10-04 NOTE — Progress Notes (Signed)
Patient ID: Glenda Perez, female   DOB: 1941/08/08, 76 y.o.   MRN: [id number]    HPI  Glenda Perez is a 76 y.o.-year-old female, initially referred by her PCP, Dr. 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 She prev. Saw Dr. Lonna Duval at [facility] (notes reviewed). She prev. saw Dr. Tobe Sos and Dr. Buddy Duty.  Reviewed history: Pt. has been dx with hypothyroidism in 2011 after she had RAI ablation for Graves ds. >> on Levothyroxine.  At last visit, she was on ~85 mcg levothyroxine daily, currently on 75 mcg daily since 04/2017 - She takes this in am now, at bedtime >> moved from at bedtime - With water - calcium, MVI in am, but >4h after LT4  - No iron, PPIs - No Biotin  I reviewed pt's thyroid tests: Lab Results  Component Value Date   TSH 0.51 07/25/2017   TSH 0.09 (L) 05/15/2017   TSH 1.076 08/19/2012   TSH 0.847 05/12/2012   TSH 1.301 02/15/2012   TSH 0.832 01/16/2012   TSH 1.517 12/17/2011   TSH 1.021 11/20/2011   TSH 1.833 10/17/2011   TSH 12.759 (H) 08/29/2011   FREET4 1.05 07/25/2017   FREET4 1.20 05/15/2017   FREET4 1.26 08/19/2012   FREET4 1.31 05/12/2012   FREET4 1.09 02/15/2012   FREET4 1.31 01/16/2012   FREET4 1.39 12/17/2011   FREET4 1.28 11/20/2011   FREET4 1.32 10/17/2011   FREET4 1.08 08/29/2011  08/30/2016: TSH 0.704 01/11/2016: TSH 0.605  Lab Results  Component Value Date   TSI >700 (H) 05/15/2017   TSI 487 (H) 05/12/2012   TSI 560 (H) 02/15/2012   TSI 603 (H) 01/16/2012   TSI 539 (H) 12/17/2011   TSI 413 (H) 11/20/2011   TSI 426 (H) 10/17/2011   TSI 365 (H) 08/29/2011   TSI 347 (H) 04/23/2011   Pt denies: - feeling nodules in neck - hoarseness - dysphagia - choking - SOB with lying down  She has no FH of thyroid disorders. No FH of thyroid cancer. No h/o radiation tx to head or neck.  No seaweed or kelp. No recent contrast studies. No herbal supplements. No Biotin use. No  recent steroids use.   Pt. also has a history of Graves ophthalmopathy >> seeing ophthalmology.  This has improved after she started taking selenium and we increased the dose to 200 mcg daily at last visit.  She also has a h/o reactive hypoglycemia.  ROS: Constitutional: no weight gain/no weight loss, no fatigue, no subjective hyperthermia, no subjective hypothermia Eyes: no blurry vision, no xerophthalmia ENT: no sore throat, + see HPI Cardiovascular: no CP/no SOB/no palpitations/no leg swelling Respiratory: no cough/no SOB/no wheezing Gastrointestinal: no N/no V/no D/no C/no acid reflux Musculoskeletal: no muscle aches/no joint aches Skin: no rashes, no hair loss Neurological: no tremors/no numbness/no tingling/no dizziness  I reviewed pt's medications, allergies, PMH, social hx, family hx, and changes were documented in the history of present illness. Otherwise, unchanged from my initial visit note.  Past Medical History:  Diagnosis Date  . Anxiety    clonezapam  . Osteopenia   . Thyroid disease    Past Surgical History:  Procedure Laterality Date  . BIOPSY BREAST    . COLONOSCOPY    . OOPHORECTOMY    . UPPER GASTROINTESTINAL ENDOSCOPY     Social History   Social History  . Marital status: Widowed    Spouse name: N/A  . Number of children: 1   Occupational  History  . artist   Social History Main Topics  . Smoking status: Never Smoker  . Smokeless tobacco: Never Used  . Alcohol use     3-5 Glasses of wine per week  . Drug use: No   Current Outpatient Medications on File Prior to Visit  Medication Sig Dispense Refill  . cholecalciferol (VITAMIN D) 1000 UNITS tablet Take 1,000 Units by mouth daily.    . clonazePAM (KLONOPIN) 0.5 MG tablet Take 0.5 mg by mouth at bedtime.     . fish oil-omega-3 fatty acids 1000 MG capsule Take 1 g by mouth daily.      Marland Kitchen levothyroxine (SYNTHROID) 75 MCG tablet Take 1 tablet (75 mcg total) by mouth daily. 31 tablet 4  . Magnesium  250 MG TABS Take 250 mg by mouth at bedtime.    . Melatonin 1 MG TABS Take 3 mg by mouth at bedtime.     . Multiple Vitamin (MULTIVITAMIN) tablet Take 1 tablet by mouth daily.      . Selenium 100 MCG CAPS Take 100 mcg by mouth 2 (two) times daily.    . TURMERIC PO Take 100 mg by mouth daily.    . vitamin C (ASCORBIC ACID) 500 MG tablet Take 500 mg by mouth daily.     No current facility-administered medications on file prior to visit.    Allergies  Allergen Reactions  . Amoxicillin Rash    Has patient had a PCN reaction causing immediate rash, facial/tongue/throat swelling, SOB or lightheadedness with hypotension:No Has patient had a PCN reaction causing severe rash involving mucus membranes or skin necrosis:No Has patient had a PCN reaction that required hospitalization:No Has patient had a PCN reaction occurring within the last 10 years:Yes If all of the above answers are "NO", then may proceed with Cephalosporin use.   . Other Rash    All cillins   Family History  Problem Relation Age of Onset  . Colon cancer Neg Hx   . Esophageal cancer Neg Hx   . Pancreatic cancer Neg Hx   . Rectal cancer Neg Hx   . Stomach cancer Neg Hx     PE: BP 96/60   Pulse 67   Ht 5' 3.5" (1.613 m)   Wt 110 lb 12.8 oz (50.3 kg)   SpO2 98%   BMI 19.32 kg/m  Wt Readings from Last 3 Encounters:  10/04/17 110 lb 12.8 oz (50.3 kg)  04/04/17 108 lb (49 kg)  01/05/16 112 lb (50.8 kg)   Constitutional: Thin, in NAD Eyes: PERRLA, EOMI, + B mild exophthalmos and + stare ENT: moist mucous membranes, no thyromegaly, no cervical lymphadenopathy Cardiovascular: RRR, No MRG Respiratory: CTA B Gastrointestinal: abdomen soft, NT, ND, BS+ Musculoskeletal: no deformities, strength intact in all 4 Skin: moist, warm, no rashes Neurological: no tremor with outstretched hands, DTR normal in all 4  ASSESSMENT: 1. Postablative Hypothyroidism - h/o Graves ds.  2. Graves ophthalmopathy (GO)  PLAN:  1.  Patient with long-standing post ablative hypothyroidism, on levothyroxine therapy. - latest thyroid labs reviewed with pt >> normal in 07/2017 - she continues on LT4 75 mcg daily now, dose increased 04/2017.  She also continues on selenium 200 mcg daily (increased in 03/2017) - pt feels good on this dose. - we discussed about taking the thyroid hormone every day, with water, >30 minutes before breakfast, separated by >4 hours from acid reflux medications, calcium, iron, multivitamins. Pt. is taking it correctly >> moved it to am since last  visit. - will check thyroid tests today: TSH and fT4 - If labs are abnormal, she will need to return for repeat TFTs in 1.5 months - OTW, RTC in 1 year  2. GO - This is mild now, But has a history of double vision in the past >> resolved;  It has improved after she started selenium.  We increased the dose to 200 mcg daily at last visit as TSI antibodies returned >700. - She continues to see ophthalmology, latest appt in 05/2017 (changed eye dr's)>> no abnormality  Office Visit on 10/04/2017  Component Date Value Ref Range Status  . TSH 10/04/2017 2.00  0.35 - 4.50 uIU/mL Final  TSH normal.  Philemon Kingdom, MD PhD [facility]

## 2017-10-04 NOTE — Patient Instructions (Addendum)
Please continue Levothyroxine 75 mcg daily.  Take the thyroid hormone every day, with water, at least 30 minutes before breakfast, separated by at least 4 hours from: - acid reflux medications - calcium - iron - multivitamins  Please stop at the lab.  Please come back for a follow-up appointment in 1 year. 

## 2017-10-08 ENCOUNTER — Other Ambulatory Visit: Payer: Self-pay | Admitting: Internal Medicine

## 2017-10-08 ENCOUNTER — Other Ambulatory Visit (HOSPITAL_COMMUNITY)
Admission: RE | Admit: 2017-10-08 | Discharge: 2017-10-08 | Disposition: A | Discharge status: Home / Self Care | Payer: Medicare HMO | Source: Ambulatory Visit | Attending: Internal Medicine | Admitting: Internal Medicine

## 2017-10-08 DIAGNOSIS — Z01419 Encounter for gynecological examination (general) (routine) without abnormal findings: Secondary | ICD-10-CM | POA: Diagnosis not present

## 2017-10-08 DIAGNOSIS — F5104 Psychophysiologic insomnia: Secondary | ICD-10-CM | POA: Diagnosis not present

## 2017-10-08 DIAGNOSIS — Z124 Encounter for screening for malignant neoplasm of cervix: Secondary | ICD-10-CM | POA: Diagnosis not present

## 2017-10-08 DIAGNOSIS — E039 Hypothyroidism, unspecified: Secondary | ICD-10-CM | POA: Diagnosis not present

## 2017-10-09 ENCOUNTER — Other Ambulatory Visit: Payer: Self-pay | Admitting: Internal Medicine

## 2017-10-14 LAB — CYTOLOGY - PAP
Diagnosis: NEGATIVE
HPV (WINDOPATH): NOT DETECTED

## 2017-10-29 DIAGNOSIS — Z1231 Encounter for screening mammogram for malignant neoplasm of breast: Secondary | ICD-10-CM | POA: Diagnosis not present

## 2017-11-19 DIAGNOSIS — B351 Tinea unguium: Secondary | ICD-10-CM | POA: Diagnosis not present

## 2017-11-19 DIAGNOSIS — D229 Melanocytic nevi, unspecified: Secondary | ICD-10-CM | POA: Diagnosis not present

## 2018-01-29 DIAGNOSIS — L739 Follicular disorder, unspecified: Secondary | ICD-10-CM | POA: Diagnosis not present

## 2018-03-01 DIAGNOSIS — R21 Rash and other nonspecific skin eruption: Secondary | ICD-10-CM | POA: Diagnosis not present

## 2018-03-04 DIAGNOSIS — L509 Urticaria, unspecified: Secondary | ICD-10-CM | POA: Diagnosis not present

## 2018-03-24 DIAGNOSIS — L300 Nummular dermatitis: Secondary | ICD-10-CM | POA: Diagnosis not present

## 2018-04-16 DIAGNOSIS — E559 Vitamin D deficiency, unspecified: Secondary | ICD-10-CM | POA: Diagnosis not present

## 2018-04-16 DIAGNOSIS — Z0000 Encounter for general adult medical examination without abnormal findings: Secondary | ICD-10-CM | POA: Diagnosis not present

## 2018-04-16 DIAGNOSIS — F419 Anxiety disorder, unspecified: Secondary | ICD-10-CM | POA: Diagnosis not present

## 2018-04-16 DIAGNOSIS — M810 Age-related osteoporosis without current pathological fracture: Secondary | ICD-10-CM | POA: Diagnosis not present

## 2018-04-16 DIAGNOSIS — E7800 Pure hypercholesterolemia, unspecified: Secondary | ICD-10-CM | POA: Diagnosis not present

## 2018-04-17 DIAGNOSIS — Z0000 Encounter for general adult medical examination without abnormal findings: Secondary | ICD-10-CM | POA: Diagnosis not present

## 2018-05-15 DIAGNOSIS — H524 Presbyopia: Secondary | ICD-10-CM | POA: Diagnosis not present

## 2018-05-15 DIAGNOSIS — H2513 Age-related nuclear cataract, bilateral: Secondary | ICD-10-CM | POA: Diagnosis not present

## 2018-05-15 DIAGNOSIS — H5203 Hypermetropia, bilateral: Secondary | ICD-10-CM | POA: Diagnosis not present

## 2018-06-01 DIAGNOSIS — S90862A Insect bite (nonvenomous), left foot, initial encounter: Secondary | ICD-10-CM | POA: Diagnosis not present

## 2018-06-01 DIAGNOSIS — T63441A Toxic effect of venom of bees, accidental (unintentional), initial encounter: Secondary | ICD-10-CM | POA: Diagnosis not present

## 2018-07-22 ENCOUNTER — Ambulatory Visit (INDEPENDENT_AMBULATORY_CARE_PROVIDER_SITE_OTHER): Payer: Medicare HMO | Admitting: Sports Medicine

## 2018-07-22 ENCOUNTER — Encounter: Payer: Self-pay | Admitting: Sports Medicine

## 2018-07-22 VITALS — BP 104/70 | Ht 63.5 in | Wt 110.0 lb

## 2018-07-22 DIAGNOSIS — S8011XA Contusion of right lower leg, initial encounter: Secondary | ICD-10-CM | POA: Diagnosis not present

## 2018-07-22 DIAGNOSIS — S8001XA Contusion of right knee, initial encounter: Secondary | ICD-10-CM | POA: Diagnosis not present

## 2018-07-22 NOTE — Progress Notes (Signed)
   HPI  CC: Right knee injury  Patient presents today after a knee injury two days ago. She was walking up a flight of stairs and caught her right toe on the step, causing a fall on the RT knee. She fell directly onto the knee without twisting. She did not hear any pops in the knee with the fall. She was able to walk on the knee immediately after the fall, though did experience pain with walking. She did not see very much swelling in the knee after the fall, and she has not noticed any locking or catching. She used ice and elevation, additionally tried tylenol for pain. She noticed a bruise over the proximal tibia. Functionally she has improved since the initial injury; walking up the stairs is becoming easier than it was at first.  She denies history of arthritis, previous pain in the knee, or previous surgeries in the knee.   ROS: Per HPI; in addition no fever, no rash, no additional weakness, no additional numbness, no additional paresthesias, and no additional falls/injury.   Objective: BP 104/70   Ht 5' 3.5" (1.613 m)   Wt 110 lb (49.9 kg)   BMI 19.18 kg/m  Gen:  NAD, well groomed, a/o x3, normal affect.  CV: Well-perfused. Warm.  Resp: Non-labored.  Neuro: Sensation intact throughout. No gross coordination deficits.  Gait: Nonpathologic posture, unremarkable stride without signs of limp or balance issues. Knee, right: Normal to inspection with no erythema or effusion or obvious bony abnormalities. +ecchymosis noted over proximal tibia. Palpation normal with no warmth, joint line tenderness, patellar tenderness, or condyle tenderness. ROM full in flexion and extension and lower leg rotation. Ligaments with solid consistent endpoints including ACL, PCL, LCL, MCL. Non painful patellar compression. Patellar glide without crepitus. Patellar and quadriceps tendons unremarkable. Hamstring and quadriceps strength is normal.   Bedside ultrasound of right knee: No evidence of fracture with  osseous lines smooth and intact. +small effusion appreciated in suprapatellar pouch. Medial and lateral meniscus are intact. Mild swelling noted at insertion of pat. Tendon to tib. Tubercle.  Ultrasound and interpretation by Wolfgang Phoenix. Jerrika Ledlow, MD   Assessment and Plan:  Right knee injury: Exam and history are most consistent with a contusion of the patellar tendon. Small effusion appreciated in suprapatellar bursa.  - Continue conservative therapy with ice, elevation - recommend patient continues to stay active - expect continued improvement slowly over the next month  Everrett Coombe, MD PGY-3 [facility]  I observed and examined the patient with the resident and agree with assessment and plan.  Note reviewed and modified by me. Stefanie Libel, MD

## 2018-07-22 NOTE — Patient Instructions (Signed)
It was a pleasure seeing you today in our clinic. Today we discussed your knee injury. Here is the treatment plan we have discussed and agreed upon together:  - continue to use ice every day on your knee - elevate the knee when you are sitting - continue to move and bend the knee, but do not force it  We expect your knee to continue to improve and get back to normal gradually over the next few weeks.  Please call with any questions about what we discussed today.

## 2018-08-28 ENCOUNTER — Other Ambulatory Visit: Payer: Self-pay | Admitting: Internal Medicine

## 2018-10-03 ENCOUNTER — Ambulatory Visit (INDEPENDENT_AMBULATORY_CARE_PROVIDER_SITE_OTHER): Payer: Medicare HMO | Admitting: Internal Medicine

## 2018-10-03 ENCOUNTER — Encounter: Payer: Self-pay | Admitting: Internal Medicine

## 2018-10-03 VITALS — BP 110/70 | HR 62 | Ht 63.5 in | Wt 109.0 lb

## 2018-10-03 DIAGNOSIS — E890 Postprocedural hypothyroidism: Secondary | ICD-10-CM

## 2018-10-03 DIAGNOSIS — Z8639 Personal history of other endocrine, nutritional and metabolic disease: Secondary | ICD-10-CM

## 2018-10-03 DIAGNOSIS — E0500 Thyrotoxicosis with diffuse goiter without thyrotoxic crisis or storm: Secondary | ICD-10-CM

## 2018-10-03 LAB — T4, FREE: FREE T4: 0.86 ng/dL (ref 0.60–1.60)

## 2018-10-03 LAB — TSH: TSH: 1.32 u[IU]/mL (ref 0.35–4.50)

## 2018-10-03 MED ORDER — LEVOTHYROXINE SODIUM 75 MCG PO TABS: 75.0000 ug | ORAL_TABLET | Freq: Every day | ORAL | 3 refills | Status: DC

## 2018-10-03 NOTE — Patient Instructions (Signed)
Please continue Levothyroxine 75 mcg daily.  Take the thyroid hormone every day, with water, at least 30 minutes before breakfast, separated by at least 4 hours from: - acid reflux medications - calcium - iron - multivitamins  Please stop at the lab.  Please come back for a follow-up appointment in 1 year. 

## 2018-10-03 NOTE — Progress Notes (Addendum)
Patient ID: Glenda Perez, female   DOB: Sep 26, 1941, 77 y.o.   MRN: [id number]    HPI  Glenda Perez is a 77 y.o.-year-old female, initially referred by her PCP, Dr. 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  Last visit a year ago. She prev. Saw Dr. Lonna Duval at [facility] (notes reviewed). She prev. saw Dr. Tobe Sos and Dr. Buddy Duty.  Reviewed and addended history: Pt. has been dx with hypothyroidism in 2011 after she had RAI ablation for Graves ds. >>  On levothyroxine, last dose decrease 04/2017.  1.5 years ago we moved levothyroxine from bedtime to a.m.  Pt is on levothyroxine 75 Mcg daily, taken: - in am - fasting - coffee + half and half + sugar - at least 30 min from b'fast - + Ca after LT4 later in the day - no Fe, PPIs, MVI - not on Biotin  Reviewed patient's TFTs: Lab Results  Component Value Date   TSH 2.00 10/04/2017   TSH 0.51 07/25/2017   TSH 0.09 (L) 05/15/2017   TSH 1.076 08/19/2012   TSH 0.847 05/12/2012   TSH 1.301 02/15/2012   TSH 0.832 01/16/2012   TSH 1.517 12/17/2011   TSH 1.021 11/20/2011   TSH 1.833 10/17/2011   FREET4 1.05 07/25/2017   FREET4 1.20 05/15/2017   FREET4 1.26 08/19/2012   FREET4 1.31 05/12/2012   FREET4 1.09 02/15/2012   FREET4 1.31 01/16/2012   FREET4 1.39 12/17/2011   FREET4 1.28 11/20/2011   FREET4 1.32 10/17/2011   FREET4 1.08 08/29/2011  08/30/2016: TSH 0.704 01/11/2016: TSH 0.605  TSI's have been elevated, and higher at last check: Lab Results  Component Value Date   TSI >700 (H) 05/15/2017   TSI 487 (H) 05/12/2012   TSI 560 (H) 02/15/2012   TSI 603 (H) 01/16/2012   TSI 539 (H) 12/17/2011   TSI 413 (H) 11/20/2011   TSI 426 (H) 10/17/2011   TSI 365 (H) 08/29/2011   TSI 347 (H) 04/23/2011   Pt denies: - feeling nodules in neck - hoarseness - dysphagia - choking - SOB with lying down  She does have a dry cough, thinks may be due to allergies.  She has  no FH of thyroid disorders. No FH of thyroid cancer. No h/o radiation tx to head or neck.  No seaweed or kelp. No recent contrast studies. No herbal supplements. No Biotin use. No recent steroids use.   She has a history of Graves' ophthalmopathy and is seeing ophthalmology.  This is improved after she started to take selenium, currently on 200 mcg daily.   She also has a h/o reactive hypoglycemia.  ROS: Constitutional: no weight gain/no weight loss, no fatigue, no subjective hyperthermia, no subjective hypothermia Eyes: no blurry vision, no xerophthalmia ENT: no sore throat, + see HPI Cardiovascular: no CP/no SOB/no palpitations/no leg swelling Respiratory: + dry cough/no SOB/no wheezing Gastrointestinal: no N/no V/no D/no C/no acid reflux Musculoskeletal: no muscle aches/no joint aches Skin: no rashes, no hair loss Neurological: no tremors/no numbness/no tingling/no dizziness  I reviewed pt's medications, allergies, PMH, social hx, family hx, and changes were documented in the history of present illness. Otherwise, unchanged from my initial visit note.   Past Medical History:  Diagnosis Date  . Anxiety    clonezapam  . Osteopenia   . Thyroid disease    Past Surgical History:  Procedure Laterality Date  . BIOPSY BREAST    . COLONOSCOPY    . OOPHORECTOMY    .  UPPER GASTROINTESTINAL ENDOSCOPY     Social History   Social History  . Marital status: Widowed    Spouse name: N/A  . Number of children: 1   Occupational History  . artist   Social History Main Topics  . Smoking status: Never Smoker  . Smokeless tobacco: Never Used  . Alcohol use     3-5 Glasses of wine per week  . Drug use: No   Current Outpatient Medications on File Prior to Visit  Medication Sig Dispense Refill  . cholecalciferol (VITAMIN D) 1000 UNITS tablet Take 1,000 Units by mouth daily.    . clonazePAM (KLONOPIN) 0.5 MG tablet Take 0.5 mg by mouth at bedtime.     . fish oil-omega-3 fatty acids  1000 MG capsule Take 1 g by mouth daily.      Marland Kitchen levothyroxine (SYNTHROID, LEVOTHROID) 75 MCG tablet TAKE 1 TABLET EVERY DAY 90 tablet 0  . Melatonin 1 MG TABS Take 3 mg by mouth at bedtime.     . Selenium 100 MCG CAPS Take 200 mcg by mouth daily.     . TURMERIC PO Take 200 mg by mouth daily.     . vitamin C (ASCORBIC ACID) 500 MG tablet Take 500 mg by mouth daily.     No current facility-administered medications on file prior to visit.    Allergies  Allergen Reactions  . Amoxicillin Rash    Has patient had a PCN reaction causing immediate rash, facial/tongue/throat swelling, SOB or lightheadedness with hypotension:No Has patient had a PCN reaction causing severe rash involving mucus membranes or skin necrosis:No Has patient had a PCN reaction that required hospitalization:No Has patient had a PCN reaction occurring within the last 10 years:Yes If all of the above answers are "NO", then may proceed with Cephalosporin use.   . Other Rash    All cillins   Family History  Problem Relation Age of Onset  . Colon cancer Neg Hx   . Esophageal cancer Neg Hx   . Pancreatic cancer Neg Hx   . Rectal cancer Neg Hx   . Stomach cancer Neg Hx     PE: BP 110/70   Pulse 62   Ht 5' 3.5" (1.613 m)   Wt 109 lb (49.4 kg)   SpO2 97%   BMI 19.01 kg/m  Wt Readings from Last 3 Encounters:  10/03/18 109 lb (49.4 kg)  07/22/18 110 lb (49.9 kg)  10/04/17 110 lb 12.8 oz (50.3 kg)   Constitutional: Thin, in NAD Eyes: PERRLA, EOMI, + bilateral mild exophthalmos and stare ENT: moist mucous membranes, no thyromegaly, no cervical lymphadenopathy Cardiovascular: RRR, No MRG Respiratory: CTA B Gastrointestinal: abdomen soft, NT, ND, BS+ Musculoskeletal: no deformities, strength intact in all 4 Skin: moist, warm, no rashes Neurological: no tremor with outstretched hands, DTR normal in all 4  ASSESSMENT: 1. Postablative Hypothyroidism - h/o Graves ds.  2. Graves ophthalmopathy (GO)  3.  History  of Graves' disease  PLAN:  1. Patient with longstanding post ablative hypothyroidism, on levothyroxine therapy - latest thyroid labs reviewed with pt >> normal at last visit, 09/2017 - she continues on LT4 75 Mcg daily and also on selenium 200 mcg daily - pt feels good on this dose. - we discussed about taking the thyroid hormone every day, with water, >30 minutes before breakfast, separated by >4 hours from acid reflux medications, calcium, iron, multivitamins. Pt. is taking it correctly. - will check thyroid tests today: TSH and fT4 - If labs  are abnormal, she will need to return for repeat TFTs in 1.5 months - Otherwise, I will see her back in a year  2. GO -Now mild, but she has a history of double vision in the past which is resolved.  She feels that her double vision improved after she started selenium.  We increased the dose to 200 mcg daily 1.5 years ago as her TSI antibodies returned undetectably high. -She continues to see ophthalmology (now sees Dr. Prudencio Burly) and on the latest exam there was no abnormality.  3.  History of Graves' disease -We will recheck her TSI antibodies to check the activity of her Graves' disease  Needs refills - 90 day supplies.  Component     Latest Ref Rng & Units 10/03/2018  T4,Free(Direct)     0.60 - 1.60 ng/dL 0.86  TSH     0.35 - 4.50 uIU/mL 1.32  TSI     <140 % baseline 386 (H)   TFTs are great!  TSI's have decreased significantly!  Philemon Kingdom, MD PhD [facility]

## 2018-10-06 LAB — THYROID STIMULATING IMMUNOGLOBULIN: TSI: 386 % baseline — ABNORMAL HIGH (ref <140)

## 2018-10-08 ENCOUNTER — Encounter: Payer: Self-pay | Admitting: Internal Medicine

## 2018-11-04 DIAGNOSIS — Z803 Family history of malignant neoplasm of breast: Secondary | ICD-10-CM | POA: Diagnosis not present

## 2018-11-04 DIAGNOSIS — Z1231 Encounter for screening mammogram for malignant neoplasm of breast: Secondary | ICD-10-CM | POA: Diagnosis not present

## 2018-11-04 DIAGNOSIS — M810 Age-related osteoporosis without current pathological fracture: Secondary | ICD-10-CM | POA: Diagnosis not present

## 2018-11-04 DIAGNOSIS — Z8262 Family history of osteoporosis: Secondary | ICD-10-CM | POA: Diagnosis not present

## 2018-11-05 DIAGNOSIS — F419 Anxiety disorder, unspecified: Secondary | ICD-10-CM | POA: Diagnosis not present

## 2018-11-05 DIAGNOSIS — E7800 Pure hypercholesterolemia, unspecified: Secondary | ICD-10-CM | POA: Diagnosis not present

## 2018-11-13 DIAGNOSIS — E7800 Pure hypercholesterolemia, unspecified: Secondary | ICD-10-CM | POA: Diagnosis not present

## 2018-12-16 DIAGNOSIS — Z0100 Encounter for examination of eyes and vision without abnormal findings: Secondary | ICD-10-CM | POA: Diagnosis not present

## 2018-12-24 ENCOUNTER — Telehealth: Payer: Self-pay | Admitting: Internal Medicine

## 2018-12-24 NOTE — Telephone Encounter (Signed)
Copied from [place] [id number]. Topic: Appointment Scheduling - Scheduling Inquiry for Clinic >> Dec 24, 2018  3:01 PM Sheran Luz wrote: Reason for CRM: Patient would like to know if Dr. Alain Marion would be willing to accept her as a new patient. She states that she was referred to him by "someone in a shoe store" and has heard wonderful things about him and the practice. Please advise on excepting

## 2018-12-25 NOTE — Telephone Encounter (Signed)
Unfortunately, I'm not able to accept any more new patients at this time.  I'm sorry! Thank you!  

## 2018-12-25 NOTE — Telephone Encounter (Signed)
Pt informed

## 2019-01-02 ENCOUNTER — Other Ambulatory Visit: Payer: Self-pay | Admitting: Dermatology

## 2019-01-02 DIAGNOSIS — L570 Actinic keratosis: Secondary | ICD-10-CM | POA: Diagnosis not present

## 2019-01-02 DIAGNOSIS — L820 Inflamed seborrheic keratosis: Secondary | ICD-10-CM | POA: Diagnosis not present

## 2019-01-02 DIAGNOSIS — D485 Neoplasm of uncertain behavior of skin: Secondary | ICD-10-CM | POA: Diagnosis not present

## 2019-01-02 DIAGNOSIS — L821 Other seborrheic keratosis: Secondary | ICD-10-CM | POA: Diagnosis not present

## 2019-01-15 DIAGNOSIS — B0052 Herpesviral keratitis: Secondary | ICD-10-CM | POA: Diagnosis not present

## 2019-01-15 DIAGNOSIS — H2513 Age-related nuclear cataract, bilateral: Secondary | ICD-10-CM | POA: Diagnosis not present

## 2019-01-19 DIAGNOSIS — B0052 Herpesviral keratitis: Secondary | ICD-10-CM | POA: Diagnosis not present

## 2019-01-26 DIAGNOSIS — H04123 Dry eye syndrome of bilateral lacrimal glands: Secondary | ICD-10-CM | POA: Diagnosis not present

## 2019-01-26 DIAGNOSIS — B0052 Herpesviral keratitis: Secondary | ICD-10-CM | POA: Diagnosis not present

## 2019-02-12 DIAGNOSIS — H04123 Dry eye syndrome of bilateral lacrimal glands: Secondary | ICD-10-CM | POA: Diagnosis not present

## 2019-03-04 DIAGNOSIS — R110 Nausea: Secondary | ICD-10-CM | POA: Diagnosis not present

## 2019-03-25 ENCOUNTER — Telehealth: Payer: Self-pay | Admitting: Internal Medicine

## 2019-03-25 NOTE — Telephone Encounter (Signed)
Patient is calling in concern to wanting to know if she is high risk due to having a thyroid issue and taking the radioactive pill.  Please Advise, Thanks

## 2019-03-26 NOTE — Telephone Encounter (Signed)
It is difficult to tell, but probably not.

## 2019-03-27 ENCOUNTER — Telehealth: Payer: Self-pay

## 2019-03-27 NOTE — Telephone Encounter (Signed)
Error

## 2019-03-27 NOTE — Telephone Encounter (Signed)
Spoke to patient and she wanted to clarify that she has NOT taken a radioactive pill just on thyroid medication.  Patient was wondering if she can go into remission of her Graves?

## 2019-03-27 NOTE — Telephone Encounter (Signed)
Left message for patient to return our call at [phone].  

## 2019-03-27 NOTE — Telephone Encounter (Signed)
She had radioactive iodine treatment  around 2011.   Her Graves' ds is in remission, but her antithyroid antibodies are still high, however, coming down.

## 2019-03-29 DIAGNOSIS — R1084 Generalized abdominal pain: Secondary | ICD-10-CM | POA: Diagnosis not present

## 2019-03-29 DIAGNOSIS — R110 Nausea: Secondary | ICD-10-CM | POA: Diagnosis not present

## 2019-03-31 NOTE — Telephone Encounter (Signed)
Notified patient.

## 2019-04-21 ENCOUNTER — Telehealth: Payer: Self-pay | Admitting: Internal Medicine

## 2019-04-21 NOTE — Telephone Encounter (Signed)
Patient would like to know if it is okay for her to take a Probiotic. Please call patient at ph# [phone] to advise.

## 2019-04-21 NOTE — Telephone Encounter (Signed)
Yes, no problem, but probably later in the day, at least 1 to 2 hours after levothyroxine.

## 2019-04-22 NOTE — Telephone Encounter (Signed)
Notified patient of message from Dr. Gherghe, patient expressed understanding and agreement. No further questions.  

## 2019-05-06 DIAGNOSIS — Z1389 Encounter for screening for other disorder: Secondary | ICD-10-CM | POA: Diagnosis not present

## 2019-05-06 DIAGNOSIS — E039 Hypothyroidism, unspecified: Secondary | ICD-10-CM | POA: Diagnosis not present

## 2019-05-06 DIAGNOSIS — E7800 Pure hypercholesterolemia, unspecified: Secondary | ICD-10-CM | POA: Diagnosis not present

## 2019-05-06 DIAGNOSIS — Z79899 Other long term (current) drug therapy: Secondary | ICD-10-CM | POA: Diagnosis not present

## 2019-05-06 DIAGNOSIS — I700 Atherosclerosis of aorta: Secondary | ICD-10-CM | POA: Diagnosis not present

## 2019-05-06 DIAGNOSIS — F5101 Primary insomnia: Secondary | ICD-10-CM | POA: Diagnosis not present

## 2019-05-06 DIAGNOSIS — Z0000 Encounter for general adult medical examination without abnormal findings: Secondary | ICD-10-CM | POA: Diagnosis not present

## 2019-05-06 DIAGNOSIS — M79672 Pain in left foot: Secondary | ICD-10-CM | POA: Diagnosis not present

## 2019-05-06 DIAGNOSIS — R202 Paresthesia of skin: Secondary | ICD-10-CM | POA: Diagnosis not present

## 2019-05-12 ENCOUNTER — Other Ambulatory Visit (HOSPITAL_COMMUNITY): Payer: Self-pay | Admitting: Geriatric Medicine

## 2019-05-12 DIAGNOSIS — I38 Endocarditis, valve unspecified: Secondary | ICD-10-CM

## 2019-05-13 ENCOUNTER — Other Ambulatory Visit: Payer: Self-pay

## 2019-05-13 ENCOUNTER — Ambulatory Visit (HOSPITAL_COMMUNITY): Payer: Medicare HMO | Attending: Cardiovascular Disease

## 2019-05-13 DIAGNOSIS — I38 Endocarditis, valve unspecified: Secondary | ICD-10-CM | POA: Diagnosis not present

## 2019-05-18 ENCOUNTER — Ambulatory Visit: Payer: Medicare HMO | Admitting: Podiatry

## 2019-05-18 ENCOUNTER — Encounter: Payer: Self-pay | Admitting: Podiatry

## 2019-05-18 ENCOUNTER — Other Ambulatory Visit: Payer: Self-pay

## 2019-05-18 DIAGNOSIS — B351 Tinea unguium: Secondary | ICD-10-CM | POA: Diagnosis not present

## 2019-05-18 DIAGNOSIS — M2042 Other hammer toe(s) (acquired), left foot: Secondary | ICD-10-CM | POA: Diagnosis not present

## 2019-05-18 DIAGNOSIS — M79674 Pain in right toe(s): Secondary | ICD-10-CM | POA: Diagnosis not present

## 2019-05-18 DIAGNOSIS — M79675 Pain in left toe(s): Secondary | ICD-10-CM

## 2019-05-18 DIAGNOSIS — M2041 Other hammer toe(s) (acquired), right foot: Secondary | ICD-10-CM

## 2019-05-18 DIAGNOSIS — L84 Corns and callosities: Secondary | ICD-10-CM

## 2019-05-18 DIAGNOSIS — M2012 Hallux valgus (acquired), left foot: Secondary | ICD-10-CM

## 2019-05-18 DIAGNOSIS — M2011 Hallux valgus (acquired), right foot: Secondary | ICD-10-CM | POA: Diagnosis not present

## 2019-05-18 NOTE — Patient Instructions (Signed)

## 2019-05-19 ENCOUNTER — Encounter: Payer: Self-pay | Admitting: Internal Medicine

## 2019-05-20 ENCOUNTER — Other Ambulatory Visit (INDEPENDENT_AMBULATORY_CARE_PROVIDER_SITE_OTHER): Payer: Medicare HMO

## 2019-05-20 ENCOUNTER — Other Ambulatory Visit: Payer: Self-pay

## 2019-05-20 ENCOUNTER — Other Ambulatory Visit: Payer: Self-pay | Admitting: Internal Medicine

## 2019-05-20 DIAGNOSIS — E890 Postprocedural hypothyroidism: Secondary | ICD-10-CM

## 2019-05-20 LAB — TSH: TSH: 0.45 u[IU]/mL (ref 0.35–4.50)

## 2019-05-20 LAB — T4, FREE: Free T4: 1.37 ng/dL (ref 0.60–1.60)

## 2019-05-20 NOTE — Telephone Encounter (Signed)
Please review and advise.

## 2019-05-21 NOTE — Progress Notes (Signed)
Subjective: Glenda Perez presents today referred by Lajean Manes, MD with cc of painful, discolored, thick toenails which interfere with daily activities.  Pain is aggravated when wearing enclosed shoe gear.  Patient states she has had fungal toenails for quite some time. She states she does not want to take anything systemically for her toenails. She relates the nail fungus appears to be spreading.  She does walk daily for exercise and is very active.  Past Medical History:  Diagnosis Date  . Anxiety    clonezapam  . Osteopenia   . Thyroid disease      Patient Active Problem List   Diagnosis Date Noted  . H/O Graves' disease 04/04/2017  . 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 Idiopathic scoliosis 04/13/2015  . 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 Problems related to lack of adequate sleep 12/20/2011  . 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 Arm pain, right 02/01/2011  . Radiculopathy of arm 02/01/2011  . CONSTIPATION 05/17/2008  . 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 DEGEN LUMBAR/LUMBOSACRAL INTERVERTEBRAL DISC 05/12/2008  . LOW BACK PAIN SYNDROME 05/12/2008  . 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 BUNIONS, BILATERAL 12/16/2007  . CALF PAIN, LEFT 12/16/2007     Past Surgical History:  Procedure Laterality Date  . BIOPSY BREAST    . COLONOSCOPY    . OOPHORECTOMY    . UPPER GASTROINTESTINAL ENDOSCOPY        Current Outpatient Medications:  .  cholecalciferol (VITAMIN D) 1000 UNITS tablet, Take 1,000 Units by mouth daily., Disp: , Rfl:  .  clonazePAM (KLONOPIN) 0.5 MG tablet, Take 0.5 mg by mouth at bedtime. , Disp: , Rfl:  .  fish oil-omega-3 fatty acids 1000 MG capsule, Take 1 g by mouth daily.  , Disp: , Rfl:  .  levothyroxine (SYNTHROID, LEVOTHROID) 75 MCG tablet, Take 1 tablet (75 mcg total) by mouth daily., Disp: 90 tablet, Rfl: 3 .  Melatonin 1 MG TABS, Take 3 mg by mouth at bedtime. , Disp: , Rfl:  .  Selenium  100 MCG CAPS, Take 200 mcg by mouth daily. , Disp: , Rfl:  .  TURMERIC PO, Take 200 mg by mouth daily. , Disp: , Rfl:  .  vitamin C (ASCORBIC ACID) 500 MG tablet, Take 500 mg by mouth daily., Disp: , Rfl:    Allergies  Allergen Reactions  . Amoxicillin Rash    Has patient had a PCN reaction causing immediate rash, facial/tongue/throat swelling, SOB or lightheadedness with hypotension:No Has patient had a PCN reaction causing severe rash involving mucus membranes or skin necrosis:No Has patient had a PCN reaction that required hospitalization:No Has patient had a PCN reaction occurring within the last 10 years:Yes If all of the above answers are "NO", then may proceed with Cephalosporin use.   . Erythromycin   . Ibuprofen   . Other Rash    All cillins     Social History   Occupational History  . Not on file  Tobacco Use  . Smoking status: Never Smoker  . Smokeless tobacco: Never Used  Substance and Sexual Activity  . Alcohol use: Yes    Alcohol/week: 7.0 standard drinks    Types: 7 Glasses of wine per week  . Drug use: No  . Sexual activity: Not on file     Family History  Problem Relation Age of Onset  . Colon cancer Neg Hx   . Esophageal cancer Neg Hx   . Pancreatic cancer Neg Hx   .  Rectal cancer Neg Hx   . Stomach cancer Neg Hx      Immunization History  Administered Date(s) Administered  . Influenza Whole 07/23/2008     Review of systems: Positive Findings in bold print.  Constitutional:  chills, fatigue, fever, sweats, weight change Communication: Optometrist, sign Ecologist, hand writing, iPad/Android device Head: headaches, head injury Eyes: changes in vision, eye pain, glaucoma, cataracts, macular degeneration, diplopia, glare,  light sensitivity, eyeglasses or contacts, blindness Ears nose mouth throat: hearing impaired, hearing aids,  ringing in ears, deaf, sign language,  vertigo,   nosebleeds,  rhinitis,  cold sores, snoring, swollen  glands Cardiovascular: HTN, edema, arrhythmia, pacemaker in place, defibrillator in place, chest pain/tightness, chronic anticoagulation, blood clot, heart failure, MI Peripheral Vascular: leg cramps, varicose veins, blood clots, lymphedema, varicosities Respiratory:  difficulty breathing, denies congestion, SOB, wheezing, cough, emphysema Gastrointestinal: change in appetite or weight, abdominal pain, constipation, diarrhea, nausea, vomiting, vomiting blood, change in bowel habits, abdominal pain, jaundice, rectal bleeding, hemorrhoids, GERD Genitourinary:  nocturia,  pain on urination, polyuria,  blood in urine, Foley catheter, urinary urgency, ESRD on hemodialysis Musculoskeletal: amputation, cramping, stiff joints, painful joints, decreased joint motion, fractures, OA, gout, hemiplegia, paraplegia, uses cane, wheelchair bound, uses walker, uses rollator Skin: +changes in toenails, color change, dryness, itching, mole changes,  rash, wound(s) Neurological: headaches, numbness in feet, paresthesias in feet, burning in feet, fainting,  seizures, change in speech. denies headaches, memory problems/poor historian, cerebral palsy, weakness, paralysis, CVA, TIA Endocrine: diabetes, hypothyroidism, hyperthyroidism,  goiter, dry mouth, flushing, heat intolerance,  cold intolerance,  excessive thirst, denies polyuria,  nocturia Hematological:  easy bleeding, excessive bleeding, easy bruising, enlarged lymph nodes, on long term blood thinner, history of past transusions Allergy/immunological:  hives, eczema, frequent infections, multiple drug allergies, seasonal allergies, transplant recipient, multiple food allergies Psychiatric:  anxiety, depression, mood disorder, suicidal ideations, hallucinations, insomnia  Objective: Vascular Examination: Capillary refill time immediate x 10 digits.  Dorsalis pedis pulses palpable b/l.   Posterior tibial pulses palpable b/l.   No digital hair x 10  digits.  Skin temperature gradient WNL b/l  Dermatological Examination: Skin with normal turgor, texture and tone b/l.  Toenails 1-5 b/l discolored, thick, dystrophic with subungual debris and pain with palpation to nailbeds due to thickness of nails.  Hyperkeratotic lesion dorsal 5th PIPJ. No erythema, no edema, no drainage, no flocculence noted.  Musculoskeletal: Muscle strength 5/5 to all LE muscle groups.  HAV with bunion b/l.  Hammertoes 2-5 b/l.  Neurological: Sensation intact with 10 gram monofilament.  Vibratory sensation intact.  Assessment: 1. Painful onychomycosis toenails 1-5 b/l   Plan: 1. Discussed onychomycosis and treatment options.  Literature dispensed on today. She would like to think about the treatment options. 2. Toenails 1-5 b/l were debrided in length and girth without iatrogenic bleeding. 3. Corn(s) pared left 5th digit PIPJ utilizing sterile scalpel blade without incident. Dispensed digital toe cap for daily comfort. 4. Patient to continue soft, supportive shoe gear daily. 5. Patient to report any pedal injuries to medical professional immediately. 6. She would like to follow up as needed and will call to schedule her next appointment. 7. Patient/POA to call should there be a concern in the interim.

## 2019-05-22 ENCOUNTER — Other Ambulatory Visit: Payer: Self-pay | Admitting: Internal Medicine

## 2019-05-22 DIAGNOSIS — E890 Postprocedural hypothyroidism: Secondary | ICD-10-CM

## 2019-07-15 ENCOUNTER — Telehealth: Payer: Self-pay | Admitting: Internal Medicine

## 2019-07-15 DIAGNOSIS — Z8639 Personal history of other endocrine, nutritional and metabolic disease: Secondary | ICD-10-CM

## 2019-07-15 DIAGNOSIS — E890 Postprocedural hypothyroidism: Secondary | ICD-10-CM

## 2019-07-15 NOTE — Telephone Encounter (Signed)
Patient called in regards to feeling like her thyroid is overactive. She states she is not feeling like her self and is very hyper.  Please Advise, Thanks

## 2019-07-16 NOTE — Addendum Note (Signed)
Addended by: Cardell Peach I on: 07/16/2019 01:48 PM   Modules accepted: Orders

## 2019-07-16 NOTE — Telephone Encounter (Signed)
Labs ordered and I called the patient and left her a VM that she can call to schedule lab visit.

## 2019-07-16 NOTE — Telephone Encounter (Signed)
No pb: We can check these-can you please order TSH, free T4, free T3?

## 2019-07-30 ENCOUNTER — Other Ambulatory Visit: Payer: Self-pay | Admitting: Internal Medicine

## 2019-07-30 ENCOUNTER — Telehealth: Payer: Self-pay | Admitting: Internal Medicine

## 2019-07-30 DIAGNOSIS — Z8639 Personal history of other endocrine, nutritional and metabolic disease: Secondary | ICD-10-CM

## 2019-07-30 NOTE — Telephone Encounter (Signed)
Patient ph# [phone] called re: patient wants to make sure that she is having a Complete Thyroid Panel Test. Please call patient at the ph# listed above to advise. If patient does not answer please a detailed message on voice mail.

## 2019-07-30 NOTE — Telephone Encounter (Signed)
Melissa, I added the Graves' antibodies  to the TSH, free T4, free T3.  This is a complete panel, but please tell her to make sure that Kieth Brightly draws all 4 tests.

## 2019-07-31 NOTE — Telephone Encounter (Signed)
Notified patient of message from Dr. Gherghe, patient expressed understanding and agreement. No further questions.  

## 2019-08-03 ENCOUNTER — Other Ambulatory Visit: Payer: Self-pay | Admitting: Internal Medicine

## 2019-08-04 ENCOUNTER — Other Ambulatory Visit: Payer: Self-pay

## 2019-08-04 ENCOUNTER — Other Ambulatory Visit: Payer: Medicare HMO

## 2019-08-04 DIAGNOSIS — E890 Postprocedural hypothyroidism: Secondary | ICD-10-CM

## 2019-08-04 DIAGNOSIS — Z8639 Personal history of other endocrine, nutritional and metabolic disease: Secondary | ICD-10-CM

## 2019-08-05 LAB — TSH: TSH: 1.82 u[IU]/mL (ref 0.35–4.50)

## 2019-08-05 LAB — T3, FREE: T3, Free: 2.7 pg/mL (ref 2.3–4.2)

## 2019-08-05 LAB — T4, FREE: Free T4: 1.16 ng/dL (ref 0.60–1.60)

## 2019-08-10 LAB — THYROID STIMULATING IMMUNOGLOBULIN: TSI: 404 % baseline — ABNORMAL HIGH (ref <140)

## 2019-09-07 ENCOUNTER — Other Ambulatory Visit: Payer: Self-pay

## 2019-09-07 DIAGNOSIS — Z20822 Contact with and (suspected) exposure to covid-19: Secondary | ICD-10-CM

## 2019-09-09 LAB — NOVEL CORONAVIRUS, NAA: SARS-CoV-2, NAA: NOT DETECTED

## 2019-10-06 ENCOUNTER — Encounter: Payer: Self-pay | Admitting: Internal Medicine

## 2019-10-06 ENCOUNTER — Ambulatory Visit (INDEPENDENT_AMBULATORY_CARE_PROVIDER_SITE_OTHER): Payer: Medicare HMO | Admitting: Internal Medicine

## 2019-10-06 ENCOUNTER — Other Ambulatory Visit: Payer: Self-pay

## 2019-10-06 VITALS — BP 110/82 | HR 60 | Ht 63.5 in | Wt 110.0 lb

## 2019-10-06 DIAGNOSIS — E0500 Thyrotoxicosis with diffuse goiter without thyrotoxic crisis or storm: Secondary | ICD-10-CM | POA: Diagnosis not present

## 2019-10-06 DIAGNOSIS — E890 Postprocedural hypothyroidism: Secondary | ICD-10-CM | POA: Diagnosis not present

## 2019-10-06 DIAGNOSIS — Z8639 Personal history of other endocrine, nutritional and metabolic disease: Secondary | ICD-10-CM | POA: Diagnosis not present

## 2019-10-06 NOTE — Patient Instructions (Signed)
Please continue Levothyroxine 75 mcg daily   Take the thyroid hormone every day, with water, at least 30 minutes before breakfast, separated by at least 4 hours from: - acid reflux medications - calcium - iron - multivitamins  Please come back for a follow-up appointment in 1 year. 

## 2019-10-06 NOTE — Progress Notes (Signed)
Patient ID: Glenda Perez, female   DOB: [birth year], 78 y.o.   MRN: [id number]   This visit occurred during the SARS-CoV-2 public health emergency.  Safety protocols were in place, including screening questions prior to the visit, additional usage of staff PPE, and extensive cleaning of exam room while observing appropriate contact time as indicated for disinfecting solutions.   HPI  Glenda Perez is a 78 y.o.-year-old female, initially referred by her PCP, Dr. 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  Last visit 1 year ago. She prev. Saw Dr. Lonna Duval at [place] (notes reviewed). She prev. saw Dr. Tobe Sos and Dr. Buddy Duty.  Reviewed and addended history: Pt. has been dx with hypothyroidism in 2011 after she had RAI ablation for Graves ds. >>  On levothyroxine.  In 2018 we moved levothyroxine from bedtime to a.m.  TFTs remain controlled since then.  Pt is on levothyroxine 75 mcg daily, taken: - in am - fasting - at least 30 min from b'fast  an coffee with half-and-half - no Fe, MVI, PPIs - + calcium >4h later - not on Biotin  Reviewed her TFTs: Lab Results  Component Value Date   TSH 1.82 08/04/2019   TSH 0.45 05/20/2019   TSH 1.32 10/03/2018   TSH 2.00 10/04/2017   TSH 0.51 07/25/2017   TSH 0.09 (L) 05/15/2017   TSH 1.076 08/19/2012   TSH 0.847 05/12/2012   TSH 1.301 02/15/2012   TSH 0.832 01/16/2012   FREET4 1.16 08/04/2019   FREET4 1.37 05/20/2019   FREET4 0.86 10/03/2018   FREET4 1.05 07/25/2017   FREET4 1.20 05/15/2017   FREET4 1.26 08/19/2012   FREET4 1.31 05/12/2012   FREET4 1.09 02/15/2012   FREET4 1.31 01/16/2012   FREET4 1.39 12/17/2011  08/30/2016: TSH 0.704 01/11/2016: TSH 0.605  Her TSI's have been elevated, much lower at last visit but slightly higher at last check 2 months ago: Lab Results  Component Value Date   TSI 404 (H) 08/04/2019   TSI 386 (H) 10/03/2018   TSI >700 (H) 05/15/2017    TSI 487 (H) 05/12/2012   TSI 560 (H) 02/15/2012   TSI 603 (H) 01/16/2012   TSI 539 (H) 12/17/2011   TSI 413 (H) 11/20/2011   TSI 426 (H) 10/17/2011   TSI 365 (H) 08/29/2011   Pt denies: - feeling nodules in neck - hoarseness - dysphagia - choking - SOB with lying down  No FH of thyroid disease or thyroid cancer. No h/o radiation tx to head or neck other than RAI treatment.  No herbal supplements. No Biotin use. No recent steroids use.   She has a history of Graves' ophthalmopathy and sees ophthalmology-Dr. Prudencio Burly.  This improved after she started to take selenium, currently on 200 micrograms daily.  She also has a h/o reactive hypoglycemia.  She is preparing to  take a trip to [place] [facility] [place].  ROS: Constitutional: no weight gain/no weight loss, no fatigue, no subjective hyperthermia, no subjective hypothermia, + insomnia  Eyes: no blurry vision,no xerophthalmia ENT: no sore throat, + see HPI Cardiovascular: no CP/no SOB/no palpitations/no leg swelling Respiratory: no cough/no SOB/no wheezing Gastrointestinal: no N/no V/no D/no C/no acid reflux Musculoskeletal: no muscle aches/no joint aches Skin: no rashes, no hair loss Neurological: no tremors/no numbness/no tingling/no dizziness  I reviewed pt's medications, allergies, PMH, social hx, family hx, and changes were documented in the history of present illness. Otherwise, unchanged from my initial visit note.   Past Medical History:  Diagnosis Date  . Anxiety    clonezapam  . Osteopenia   . Thyroid disease    Past Surgical History:  Procedure Laterality Date  . BIOPSY BREAST    . COLONOSCOPY    . OOPHORECTOMY    . UPPER GASTROINTESTINAL ENDOSCOPY     Social History   Social History  . Marital status: Widowed    Spouse name: N/A  . Number of children: 1   Occupational History  . artist   Social History Main Topics  . Smoking status: Never Smoker  . Smokeless tobacco: Never Used  . Alcohol use     3-5  Glasses of wine per week  . Drug use: No   Current Outpatient Medications on File Prior to Visit  Medication Sig Dispense Refill  . cholecalciferol (VITAMIN D) 1000 UNITS tablet Take 1,000 Units by mouth daily.    . clonazePAM (KLONOPIN) 0.5 MG tablet Take 0.5 mg by mouth at bedtime.     . fish oil-omega-3 fatty acids 1000 MG capsule Take 1 g by mouth daily.      Marland Kitchen levothyroxine (SYNTHROID) 75 MCG tablet TAKE 1 TABLET (75 MCG TOTAL) BY MOUTH DAILY. 90 tablet 3  . Melatonin 1 MG TABS Take 3 mg by mouth at bedtime.     . Selenium 100 MCG CAPS Take 200 mcg by mouth daily.     . TURMERIC PO Take 200 mg by mouth daily.     . vitamin C (ASCORBIC ACID) 500 MG tablet Take 500 mg by mouth daily.     No current facility-administered medications on file prior to visit.   Allergies  Allergen Reactions  . Amoxicillin Rash    Has patient had a PCN reaction causing immediate rash, facial/tongue/throat swelling, SOB or lightheadedness with hypotension:No Has patient had a PCN reaction causing severe rash involving mucus membranes or skin necrosis:No Has patient had a PCN reaction that required hospitalization:No Has patient had a PCN reaction occurring within the last 10 years:Yes If all of the above answers are "NO", then may proceed with Cephalosporin use.   . Erythromycin   . Ibuprofen   . Other Rash    All cillins   Family History  Problem Relation Age of Onset  . Colon cancer Neg Hx   . Esophageal cancer Neg Hx   . Pancreatic cancer Neg Hx   . Rectal cancer Neg Hx   . Stomach cancer Neg Hx     PE: BP 110/82   Pulse 60   Ht 5' 3.5" (1.613 m)   Wt 110 lb (49.9 kg)   SpO2 98%   BMI 19.18 kg/m  Wt Readings from Last 3 Encounters:  10/06/19 110 lb (49.9 kg)  10/03/18 109 lb (49.4 kg)  07/22/18 110 lb (49.9 kg)   Constitutional:thin, in NAD Eyes: PERRLA, EOMI, + bilateral mild exophthalmos and stare ENT: moist mucous membranes, no thyromegaly, no cervical  lymphadenopathy Cardiovascular: RRR, No MRG Respiratory: CTA B Gastrointestinal: abdomen soft, NT, ND, BS+ Musculoskeletal: no deformities, strength intact in all 4 Skin: moist, warm, no rashes Neurological: no tremor with outstretched hands, DTR normal in all 4  ASSESSMENT: 1. Postablative Hypothyroidism - h/o Graves ds.  2. Graves ophthalmopathy (GO)  3.  History of Graves' disease  PLAN:  1. Patient with longstanding post ablative hypothyroidism, on levothyroxine therapy - latest thyroid labs reviewed with pt >> normal 2 months ago: Lab Results  Component Value Date   TSH 1.82 08/04/2019   - she  continues on LT4 75 mcg daily - pt feels good on this dose, without complaints - we discussed about taking the thyroid hormone every day, with water, >30 minutes before breakfast, separated by >4 hours from acid reflux medications, calcium, iron, multivitamins. Pt. is taking it correctly. - I will see her back in a year  2. GO - now mild, but she has a history of palpitation in the past, which is resolved.  She feels that this is improved after starting selenium.  She is on 200 mcg daily. -She denies double vision, blurry vision, chemosis, eye pain -She continues to see Dr. Prudencio Burly with ophthalmology  3.  History of Graves' disease -Her TSI antibodies remain elevated, but at last visit they were significantly lower. -We rechecked these 2 months ago and the level was slightly higher, but still lower compared to previous levels -We will recheck them at next visit  Needs refills - 90 day supplies.   Philemon Kingdom, MD PhD [facility] Endocrinology

## 2019-10-12 ENCOUNTER — Ambulatory Visit: Payer: Medicare HMO | Attending: Internal Medicine

## 2019-10-12 DIAGNOSIS — Z20828 Contact with and (suspected) exposure to other viral communicable diseases: Secondary | ICD-10-CM | POA: Diagnosis not present

## 2019-10-12 DIAGNOSIS — Z20822 Contact with and (suspected) exposure to covid-19: Secondary | ICD-10-CM

## 2019-10-13 LAB — NOVEL CORONAVIRUS, NAA: SARS-CoV-2, NAA: NOT DETECTED

## 2019-11-02 ENCOUNTER — Ambulatory Visit: Payer: Medicare Other | Attending: Internal Medicine

## 2019-11-02 DIAGNOSIS — Z23 Encounter for immunization: Secondary | ICD-10-CM

## 2019-11-02 NOTE — Progress Notes (Signed)
   Covid-19 Vaccination Clinic  Name:  Glenda Perez    MRN: [id number] DOB: Sep 25, 1941  11/02/2019  [name] was observed post Covid-19 immunization for 30 minutes based on pre-vaccination screening without incidence. She was provided with Vaccine Information Sheet and instruction to access the V-Safe system.   Patient experienced some anxiety and was given some water. Showed to feel better shortly after.   [name] was instructed to call 911 with any severe reactions post vaccine: Marland Kitchen Difficulty breathing  . Swelling of your face and throat  . A fast heartbeat  . A bad rash all over your body  . Dizziness and weakness    Immunizations Administered    Name Date Dose VIS Date Route   Pfizer COVID-19 Vaccine 11/02/2019  9:13 AM 0.3 mL 10/02/2019 Intramuscular   Manufacturer: [place]   Lot: [id number]   [place]: [id number]

## 2019-11-10 ENCOUNTER — Encounter: Payer: Self-pay | Admitting: Internal Medicine

## 2019-11-10 DIAGNOSIS — Z1231 Encounter for screening mammogram for malignant neoplasm of breast: Secondary | ICD-10-CM | POA: Diagnosis not present

## 2019-11-12 DIAGNOSIS — F4322 Adjustment disorder with anxiety: Secondary | ICD-10-CM | POA: Diagnosis not present

## 2019-11-14 DIAGNOSIS — Z20828 Contact with and (suspected) exposure to other viral communicable diseases: Secondary | ICD-10-CM | POA: Diagnosis not present

## 2019-11-22 ENCOUNTER — Ambulatory Visit: Payer: Medicare HMO | Attending: Internal Medicine

## 2019-11-22 DIAGNOSIS — Z23 Encounter for immunization: Secondary | ICD-10-CM | POA: Insufficient documentation

## 2019-11-22 NOTE — Progress Notes (Signed)
   Covid-19 Vaccination Clinic  Name:  Glenda Perez    MRN: [id number] DOB: May 16, 1941  11/22/2019  [name] was observed post Covid-19 immunization for 15 minutes without incidence. She was provided with Vaccine Information Sheet and instruction to access the V-Safe system.   [name] was instructed to call 911 with any severe reactions post vaccine: Marland Kitchen Difficulty breathing  . Swelling of your face and throat  . A fast heartbeat  . A bad rash all over your body  . Dizziness and weakness    Immunizations Administered    Name Date Dose VIS Date Route   Pfizer COVID-19 Vaccine 11/22/2019  1:30 PM 0.3 mL 10/02/2019 Intramuscular   Manufacturer: [place]   Lot: [id number]   [place]: [id number]

## 2019-11-26 DIAGNOSIS — F4322 Adjustment disorder with anxiety: Secondary | ICD-10-CM | POA: Diagnosis not present

## 2019-12-02 DIAGNOSIS — Z20828 Contact with and (suspected) exposure to other viral communicable diseases: Secondary | ICD-10-CM | POA: Diagnosis not present

## 2020-01-04 DIAGNOSIS — B0052 Herpesviral keratitis: Secondary | ICD-10-CM | POA: Diagnosis not present

## 2020-01-04 DIAGNOSIS — H52203 Unspecified astigmatism, bilateral: Secondary | ICD-10-CM | POA: Diagnosis not present

## 2020-01-04 DIAGNOSIS — H2513 Age-related nuclear cataract, bilateral: Secondary | ICD-10-CM | POA: Diagnosis not present

## 2020-01-04 DIAGNOSIS — H524 Presbyopia: Secondary | ICD-10-CM | POA: Diagnosis not present

## 2020-02-16 DIAGNOSIS — H10411 Chronic giant papillary conjunctivitis, right eye: Secondary | ICD-10-CM | POA: Diagnosis not present

## 2020-03-23 ENCOUNTER — Other Ambulatory Visit: Payer: Self-pay | Admitting: Internal Medicine

## 2020-05-10 DIAGNOSIS — I700 Atherosclerosis of aorta: Secondary | ICD-10-CM | POA: Diagnosis not present

## 2020-05-10 DIAGNOSIS — Z79899 Other long term (current) drug therapy: Secondary | ICD-10-CM | POA: Diagnosis not present

## 2020-05-10 DIAGNOSIS — Z23 Encounter for immunization: Secondary | ICD-10-CM | POA: Diagnosis not present

## 2020-05-10 DIAGNOSIS — E7800 Pure hypercholesterolemia, unspecified: Secondary | ICD-10-CM | POA: Diagnosis not present

## 2020-05-10 DIAGNOSIS — E039 Hypothyroidism, unspecified: Secondary | ICD-10-CM | POA: Diagnosis not present

## 2020-05-10 DIAGNOSIS — Z0000 Encounter for general adult medical examination without abnormal findings: Secondary | ICD-10-CM | POA: Diagnosis not present

## 2020-05-10 DIAGNOSIS — I351 Nonrheumatic aortic (valve) insufficiency: Secondary | ICD-10-CM | POA: Diagnosis not present

## 2020-05-10 DIAGNOSIS — Z1389 Encounter for screening for other disorder: Secondary | ICD-10-CM | POA: Diagnosis not present

## 2020-05-16 ENCOUNTER — Other Ambulatory Visit (HOSPITAL_COMMUNITY): Payer: Self-pay | Admitting: Geriatric Medicine

## 2020-05-16 DIAGNOSIS — I351 Nonrheumatic aortic (valve) insufficiency: Secondary | ICD-10-CM

## 2020-06-06 ENCOUNTER — Other Ambulatory Visit: Payer: Self-pay

## 2020-06-06 ENCOUNTER — Ambulatory Visit (HOSPITAL_COMMUNITY): Payer: Medicare HMO | Attending: Internal Medicine

## 2020-06-06 DIAGNOSIS — I351 Nonrheumatic aortic (valve) insufficiency: Secondary | ICD-10-CM | POA: Diagnosis not present

## 2020-06-06 LAB — ECHOCARDIOGRAM COMPLETE
Area-P 1/2: 2.71 cm2
P 1/2 time: 609 msec
S' Lateral: 2.5 cm

## 2020-06-07 DIAGNOSIS — Z79899 Other long term (current) drug therapy: Secondary | ICD-10-CM | POA: Diagnosis not present

## 2020-07-02 ENCOUNTER — Ambulatory Visit: Payer: Medicare HMO | Attending: Internal Medicine

## 2020-07-02 DIAGNOSIS — Z23 Encounter for immunization: Secondary | ICD-10-CM

## 2020-07-02 NOTE — Progress Notes (Signed)
   Covid-19 Vaccination Clinic  Name:  Glenda Perez    MRN: [id number] DOB: Mar 23, 1941  07/02/2020  [name] was observed post Covid-19 immunization for 15 minutes without incident. She was provided with Vaccine Information Sheet and instruction to access the V-Safe system.   [name] was instructed to call 911 with any severe reactions post vaccine: Marland Kitchen Difficulty breathing  . Swelling of face and throat  . A fast heartbeat  . A bad rash all over body  . Dizziness and weakness

## 2020-07-18 DIAGNOSIS — Z23 Encounter for immunization: Secondary | ICD-10-CM | POA: Diagnosis not present

## 2020-07-27 ENCOUNTER — Other Ambulatory Visit (HOSPITAL_COMMUNITY): Payer: Self-pay | Admitting: Geriatric Medicine

## 2020-07-27 DIAGNOSIS — R079 Chest pain, unspecified: Secondary | ICD-10-CM | POA: Diagnosis not present

## 2020-07-27 DIAGNOSIS — R55 Syncope and collapse: Secondary | ICD-10-CM | POA: Diagnosis not present

## 2020-07-27 DIAGNOSIS — I351 Nonrheumatic aortic (valve) insufficiency: Secondary | ICD-10-CM | POA: Diagnosis not present

## 2020-07-27 DIAGNOSIS — F419 Anxiety disorder, unspecified: Secondary | ICD-10-CM | POA: Diagnosis not present

## 2020-07-28 ENCOUNTER — Telehealth (HOSPITAL_COMMUNITY): Payer: Self-pay | Admitting: Geriatric Medicine

## 2020-07-28 NOTE — Telephone Encounter (Signed)
I called patient to schedule GXT and she is very aggitated that she has to quarantine and have COVID test in [place]. She has declined to schedule at this time due to she has a lot going on in her life and not able to commit. She will call Dr. Felipa Eth office to inform them also. Order will be removed from the WQ. Thank you.

## 2020-08-19 ENCOUNTER — Other Ambulatory Visit: Payer: Self-pay | Admitting: Geriatric Medicine

## 2020-08-19 ENCOUNTER — Ambulatory Visit
Admission: RE | Admit: 2020-08-19 | Discharge: 2020-08-19 | Disposition: A | Discharge status: Home / Self Care | Payer: Medicare HMO | Source: Ambulatory Visit | Attending: Geriatric Medicine | Admitting: Geriatric Medicine

## 2020-08-19 DIAGNOSIS — R1031 Right lower quadrant pain: Secondary | ICD-10-CM | POA: Diagnosis not present

## 2020-08-19 MED ORDER — IOPAMIDOL (ISOVUE-300) INJECTION 61%
100.0000 mL | Freq: Once | INTRAVENOUS | Status: AC | PRN
  Administered 2020-08-19: 100 mL via INTRAVENOUS

## 2020-08-28 DIAGNOSIS — R1031 Right lower quadrant pain: Secondary | ICD-10-CM | POA: Diagnosis not present

## 2020-08-28 DIAGNOSIS — R110 Nausea: Secondary | ICD-10-CM | POA: Diagnosis not present

## 2020-09-08 DIAGNOSIS — R1031 Right lower quadrant pain: Secondary | ICD-10-CM | POA: Diagnosis not present

## 2020-09-08 DIAGNOSIS — K5900 Constipation, unspecified: Secondary | ICD-10-CM | POA: Diagnosis not present

## 2020-09-08 DIAGNOSIS — R140 Abdominal distension (gaseous): Secondary | ICD-10-CM | POA: Diagnosis not present

## 2020-09-08 DIAGNOSIS — Z86010 Personal history of colonic polyps: Secondary | ICD-10-CM | POA: Diagnosis not present

## 2020-09-30 DIAGNOSIS — K648 Other hemorrhoids: Secondary | ICD-10-CM | POA: Diagnosis not present

## 2020-09-30 DIAGNOSIS — Z1211 Encounter for screening for malignant neoplasm of colon: Secondary | ICD-10-CM | POA: Diagnosis not present

## 2020-09-30 DIAGNOSIS — K317 Polyp of stomach and duodenum: Secondary | ICD-10-CM | POA: Diagnosis not present

## 2020-09-30 DIAGNOSIS — K5730 Diverticulosis of large intestine without perforation or abscess without bleeding: Secondary | ICD-10-CM | POA: Diagnosis not present

## 2020-09-30 DIAGNOSIS — R110 Nausea: Secondary | ICD-10-CM | POA: Diagnosis not present

## 2020-09-30 DIAGNOSIS — Z86010 Personal history of colonic polyps: Secondary | ICD-10-CM | POA: Diagnosis not present

## 2020-10-06 ENCOUNTER — Ambulatory Visit (INDEPENDENT_AMBULATORY_CARE_PROVIDER_SITE_OTHER): Payer: Medicare HMO | Admitting: Internal Medicine

## 2020-10-06 ENCOUNTER — Other Ambulatory Visit (INDEPENDENT_AMBULATORY_CARE_PROVIDER_SITE_OTHER): Payer: Medicare HMO

## 2020-10-06 ENCOUNTER — Encounter: Payer: Self-pay | Admitting: Internal Medicine

## 2020-10-06 ENCOUNTER — Other Ambulatory Visit: Payer: Self-pay

## 2020-10-06 VITALS — BP 120/70 | HR 62 | Ht 63.0 in | Wt 107.0 lb

## 2020-10-06 DIAGNOSIS — E0500 Thyrotoxicosis with diffuse goiter without thyrotoxic crisis or storm: Secondary | ICD-10-CM | POA: Diagnosis not present

## 2020-10-06 DIAGNOSIS — E890 Postprocedural hypothyroidism: Secondary | ICD-10-CM

## 2020-10-06 DIAGNOSIS — Z8639 Personal history of other endocrine, nutritional and metabolic disease: Secondary | ICD-10-CM

## 2020-10-06 LAB — TSH: TSH: 1.03 u[IU]/mL (ref 0.35–4.50)

## 2020-10-06 LAB — T4, FREE: Free T4: 0.91 ng/dL (ref 0.60–1.60)

## 2020-10-06 NOTE — Patient Instructions (Addendum)
Please continue Levothyroxine 75 mcg daily.  Take the thyroid hormone every day, with water, at least 30 minutes before breakfast, separated by at least 4 hours from: - acid reflux medications - calcium - iron - multivitamins  Please return for labs.  Please come back for a follow-up appointment in 1 year.

## 2020-10-06 NOTE — Progress Notes (Signed)
Patient ID: Glenda Perez, female   DOB: Jun 06, 1941, 79 y.o.   MRN: [id number]   This visit occurred during the SARS-CoV-2 public health emergency.  Safety protocols were in place, including screening questions prior to the visit, additional usage of staff PPE, and extensive cleaning of exam room while observing appropriate contact time as indicated for disinfecting solutions.   HPI  Glenda Perez is a 79 y.o.-year-old female, initially referred by her PCP, Dr. 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  Last visit 1 year ago. She prev. Saw Dr. Lonna Duval at [place] (notes reviewed). She prev. saw Dr. Tobe Sos and Dr. Buddy Duty.  Reviewed and addended history: Pt. has been dx with hypothyroidism in 2011 after she had RAI ablation for Graves ds. >>  On levothyroxine.  In 2018 we moved levothyroxine from bedtime to a.m.  TFTs remained controlled since then.  Pt is on levothyroxine 75 mcg daily, taken: - in am - fasting - at least 30 min from b'fast and coffee with half-and-half - + calcium >4h later - no iron - no multivitamins - no PPIs - not on Biotin  Reviewed her TFTs: Lab Results  Component Value Date   TSH 1.82 08/04/2019   TSH 0.45 05/20/2019   TSH 1.32 10/03/2018   TSH 2.00 10/04/2017   TSH 0.51 07/25/2017   TSH 0.09 (L) 05/15/2017   TSH 1.076 08/19/2012   TSH 0.847 05/12/2012   TSH 1.301 02/15/2012   TSH 0.832 01/16/2012   FREET4 1.16 08/04/2019   FREET4 1.37 05/20/2019   FREET4 0.86 10/03/2018   FREET4 1.05 07/25/2017   FREET4 1.20 05/15/2017   FREET4 1.26 08/19/2012   FREET4 1.31 05/12/2012   FREET4 1.09 02/15/2012   FREET4 1.31 01/16/2012   FREET4 1.39 12/17/2011  08/30/2016: TSH 0.704 01/11/2016: TSH 0.605  Her TSIs remain elevated: Lab Results  Component Value Date   TSI 404 (H) 08/04/2019   TSI 386 (H) 10/03/2018   TSI >700 (H) 05/15/2017   TSI 487 (H) 05/12/2012   TSI 560 (H) 02/15/2012    TSI 603 (H) 01/16/2012   TSI 539 (H) 12/17/2011   TSI 413 (H) 11/20/2011   TSI 426 (H) 10/17/2011   TSI 365 (H) 08/29/2011   Pt denies: - feeling nodules in neck - hoarseness - dysphagia - choking - SOB with lying down  No FH of thyroid disease or thyroid cancer. No h/o radiation tx to head or neck.  No seaweed or kelp. No recent contrast studies. On Turmeric. No Biotin use. No recent steroids use.   She has a history of Graves' ophthalmopathy and sees ophthalmology-Dr. Prudencio Burly.  This improved after she started to take 200 mcg selenium daily.  She also has a h/o reactive hypoglycemia.  ROS: Constitutional: no weight gain/+ weight loss, no fatigue, no subjective hyperthermia, no subjective hypothermia Eyes: no blurry vision, no xerophthalmia ENT: no sore throat, + see HPI Cardiovascular: no CP/no SOB/no palpitations/no leg swelling Respiratory: no cough/no SOB/no wheezing Gastrointestinal: no N/no V/no D/no C/no acid reflux Musculoskeletal: no muscle aches/no joint aches Skin: no rashes, no hair loss Neurological: no tremors/no numbness/no tingling/no dizziness  I reviewed pt's medications, allergies, PMH, social hx, family hx, and changes were documented in the history of present illness. Otherwise, unchanged from my initial visit note.   Past Medical History:  Diagnosis Date  . Anxiety    clonezapam  . Osteopenia   . Thyroid disease    Past Surgical History:  Procedure Laterality Date  . BIOPSY BREAST    . COLONOSCOPY    . OOPHORECTOMY    . UPPER GASTROINTESTINAL ENDOSCOPY     Social History   Social History  . Marital status: Widowed    Spouse name: N/A  . Number of children: 1   Occupational History  . artist   Social History Main Topics  . Smoking status: Never Smoker  . Smokeless tobacco: Never Used  . Alcohol use     3-5 Glasses of wine per week  . Drug use: No   Current Outpatient Medications on File Prior to Visit  Medication Sig Dispense  Refill  . cholecalciferol (VITAMIN D) 1000 UNITS tablet Take 1,000 Units by mouth daily.    . clonazePAM (KLONOPIN) 0.5 MG tablet Take 0.5 mg by mouth at bedtime.     . fish oil-omega-3 fatty acids 1000 MG capsule Take 1 g by mouth daily.      Marland Kitchen levothyroxine (SYNTHROID) 75 MCG tablet TAKE 1 TABLET EVERY DAY 90 tablet 3  . Selenium 100 MCG CAPS Take 200 mcg by mouth daily.     . TURMERIC PO Take 200 mg by mouth daily.     . vitamin C (ASCORBIC ACID) 500 MG tablet Take 500 mg by mouth daily.     No current facility-administered medications on file prior to visit.   Allergies  Allergen Reactions  . Amoxicillin Rash    Has patient had a PCN reaction causing immediate rash, facial/tongue/throat swelling, SOB or lightheadedness with hypotension:No Has patient had a PCN reaction causing severe rash involving mucus membranes or skin necrosis:No Has patient had a PCN reaction that required hospitalization:No Has patient had a PCN reaction occurring within the last 10 years:Yes If all of the above answers are "NO", then may proceed with Cephalosporin use.   . Erythromycin   . Ibuprofen   . Other Rash    All cillins   Family History  Problem Relation Age of Onset  . Colon cancer Neg Hx   . Esophageal cancer Neg Hx   . Pancreatic cancer Neg Hx   . Rectal cancer Neg Hx   . Stomach cancer Neg Hx     PE: BP 120/70   Pulse 62   Ht 5\' 3"  (1.6 m)   Wt 107 lb (48.5 kg)   SpO2 97%   BMI 18.95 kg/m  Wt Readings from Last 3 Encounters:  10/06/20 107 lb (48.5 kg)  10/06/19 110 lb (49.9 kg)  10/03/18 109 lb (49.4 kg)   Constitutional: normal weight, in NAD Eyes: PERRLA, EOMI, + B mild exophthalmos and stare ENT: moist mucous membranes, no thyromegaly, no cervical lymphadenopathy Cardiovascular: RRR, No MRG Respiratory: CTA B Gastrointestinal: abdomen soft, NT, ND, BS+ Musculoskeletal: no deformities, strength intact in all 4 Skin: moist, warm, no rashes, + bluish discoloration of the  first 3 fingers in bilateral hands (has a history of Raynaud's phenomenon) Neurological: no tremor with outstretched hands, DTR normal in all 4  ASSESSMENT: 1. Postablative Hypothyroidism - h/o Graves ds.  2. Graves ophthalmopathy (GO)  3.  History of Graves' disease  PLAN:  1. Patient with longstanding post ablative hypothyroidism, on levothyroxine therapy - latest thyroid labs reviewed with pt >> normal: Lab Results  Component Value Date   TSH 1.82 08/04/2019   - she continues on LT4 75 mcg daily - pt feels good on this dose. - we discussed about taking the thyroid hormone every day, with water, >30 minutes before  breakfast, separated by >4 hours from acid reflux medications, calcium, iron, multivitamins. Pt. is taking it correctly. - will check thyroid tests today: TSH and fT4 - If labs are abnormal, she will need to return for repeat TFTs in 1.5 months - I will see her back in 1 year  2. GO -Now mild, improved after starting selenium.  She takes 200 mcg daily. -No double vision, blurry vision, chemosis, eye pain -She continues to see Dr. Prudencio Burly with ophthalmology  3.  History of Graves' disease -Her TSI antibodies remain elevated -We'll recheck them now  Appointment on 10/06/2020  Component Date Value Ref Range Status  . Free T4 10/06/2020 0.91  0.60 - 1.60 ng/dL Final   Comment: Specimens from patients who are undergoing biotin therapy and /or ingesting biotin supplements may contain high levels of biotin.  The higher biotin concentration in these specimens interferes with this Free T4 assay.  Specimens that contain high levels  of biotin may cause false high results for this Free T4 assay.  Please interpret results in light of the total clinical presentation of the patient.    Marland Kitchen TSH 10/06/2020 1.03  0.35 - 4.50 uIU/mL Final   Normal TFTs.  TSIs pending.  Philemon Kingdom, MD PhD [facility] Endocrinology

## 2020-10-07 ENCOUNTER — Encounter: Payer: Self-pay | Admitting: Internal Medicine

## 2020-10-11 ENCOUNTER — Encounter: Payer: Self-pay | Admitting: Internal Medicine

## 2020-10-11 LAB — THYROID STIMULATING IMMUNOGLOBULIN: TSI: 244 % baseline — ABNORMAL HIGH (ref <140)

## 2020-10-20 DIAGNOSIS — H10411 Chronic giant papillary conjunctivitis, right eye: Secondary | ICD-10-CM | POA: Diagnosis not present

## 2020-11-14 DIAGNOSIS — Z1231 Encounter for screening mammogram for malignant neoplasm of breast: Secondary | ICD-10-CM | POA: Diagnosis not present

## 2020-12-07 ENCOUNTER — Other Ambulatory Visit: Payer: Self-pay | Admitting: Geriatric Medicine

## 2020-12-07 DIAGNOSIS — G4452 New daily persistent headache (NDPH): Secondary | ICD-10-CM

## 2020-12-07 DIAGNOSIS — Z79899 Other long term (current) drug therapy: Secondary | ICD-10-CM | POA: Diagnosis not present

## 2020-12-20 ENCOUNTER — Ambulatory Visit
Admission: RE | Admit: 2020-12-20 | Discharge: 2020-12-20 | Disposition: A | Discharge status: Home / Self Care | Payer: Medicare HMO | Source: Ambulatory Visit | Attending: Geriatric Medicine | Admitting: Geriatric Medicine

## 2020-12-20 DIAGNOSIS — R519 Headache, unspecified: Secondary | ICD-10-CM | POA: Diagnosis not present

## 2020-12-20 DIAGNOSIS — G4452 New daily persistent headache (NDPH): Secondary | ICD-10-CM

## 2020-12-20 MED ORDER — GADOBENATE DIMEGLUMINE 529 MG/ML IV SOLN
9.0000 mL | Freq: Once | INTRAVENOUS | Status: AC | PRN
  Administered 2020-12-20: 9 mL via INTRAVENOUS

## 2020-12-26 ENCOUNTER — Other Ambulatory Visit: Payer: Medicare HMO

## 2020-12-28 DIAGNOSIS — H43813 Vitreous degeneration, bilateral: Secondary | ICD-10-CM | POA: Diagnosis not present

## 2020-12-28 DIAGNOSIS — H2513 Age-related nuclear cataract, bilateral: Secondary | ICD-10-CM | POA: Diagnosis not present

## 2020-12-28 DIAGNOSIS — G43909 Migraine, unspecified, not intractable, without status migrainosus: Secondary | ICD-10-CM | POA: Diagnosis not present

## 2021-01-04 ENCOUNTER — Other Ambulatory Visit: Payer: Self-pay

## 2021-01-04 ENCOUNTER — Ambulatory Visit: Payer: Medicare HMO | Admitting: Family Medicine

## 2021-01-04 VITALS — BP 126/64 | Ht 61.5 in | Wt 107.0 lb

## 2021-01-04 DIAGNOSIS — M25552 Pain in left hip: Secondary | ICD-10-CM | POA: Diagnosis not present

## 2021-01-04 NOTE — Patient Instructions (Addendum)
It was great to see you! 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 Ice over area of pain 3-4 times a day for 15 minutes at a time Hip side raise exercise 3 sets of 10 once a day - add weights if this becomes too easy. Stretches - pick 2-3 and hold for 20-30 seconds x 3 - do once or twice a day. Topical aspercreme up to 4 times a day as needed. Consider steroid injection - we can do this today or see how you do over the next week with the rehab and decide then - but I'd recommend it about a week before you leave. Follow up with me in 6 weeks otherwise. Have a great trip!

## 2021-01-05 ENCOUNTER — Encounter: Payer: Self-pay | Admitting: Family Medicine

## 2021-01-05 NOTE — Progress Notes (Signed)
PCP: Lajean Manes, MD  Subjective:   HPI: Patient is a 80 y.o. female here for left leg pain.  Patient reports for just over a month she's had worsening left lateral hip pain. Has progressed to include lateral left leg to about the knee, just distal to this. No numbness or tingling. No associated back pain. Some pain when using stairs and worse lying on left side.  Past Medical History:  Diagnosis Date  . Anxiety    clonezapam  . Osteopenia   . Thyroid disease     Current Outpatient Medications on File Prior to Visit  Medication Sig Dispense Refill  . cholecalciferol (VITAMIN D) 1000 UNITS tablet Take 1,000 Units by mouth daily.    . clonazePAM (KLONOPIN) 0.5 MG tablet Take 0.5 mg by mouth at bedtime.     . fish oil-omega-3 fatty acids 1000 MG capsule Take 1 g by mouth daily.      Marland Kitchen levothyroxine (SYNTHROID) 75 MCG tablet TAKE 1 TABLET EVERY DAY 90 tablet 3  . Selenium 100 MCG CAPS Take 200 mcg by mouth daily.     . TURMERIC PO Take 200 mg by mouth daily.     . vitamin C (ASCORBIC ACID) 500 MG tablet Take 500 mg by mouth daily.     No current facility-administered medications on file prior to visit.    Past Surgical History:  Procedure Laterality Date  . BIOPSY BREAST    . COLONOSCOPY    . OOPHORECTOMY    . UPPER GASTROINTESTINAL ENDOSCOPY      Allergies  Allergen Reactions  . Amoxicillin Rash    Has patient had a PCN reaction causing immediate rash, facial/tongue/throat swelling, SOB or lightheadedness with hypotension:No Has patient had a PCN reaction causing severe rash involving mucus membranes or skin necrosis:No Has patient had a PCN reaction that required hospitalization:No Has patient had a PCN reaction occurring within the last 10 years:Yes If all of the above answers are "NO", then may proceed with Cephalosporin use.   . Erythromycin   . Ibuprofen   . Other Rash    All cillins Other reaction(s): tingling in fingers    Social History    Socioeconomic History  . Marital status: Widowed    Spouse name: Not on file  . Number of children: Not on file  . Years of education: Not on file  . Highest education level: Not on file  Occupational History  . Not on file  Tobacco Use  . Smoking status: Never Smoker  . Smokeless tobacco: Never Used  Substance and Sexual Activity  . Alcohol use: Yes    Alcohol/week: 7.0 standard drinks    Types: 7 Glasses of wine per week  . Drug use: No  . Sexual activity: Not on file  Other Topics Concern  . Not on file  Social History Narrative  . Not on file   Social Determinants of Health   Financial Resource Strain: Not on file  Food Insecurity: Not on file  Transportation Needs: Not on file  Physical Activity: Not on file  Stress: Not on file  Social Connections: Not on file  Intimate Partner Violence: Not on file    Family History  Problem Relation Age of Onset  . Colon cancer Neg Hx   . Esophageal cancer Neg Hx   . Pancreatic cancer Neg Hx   . Rectal cancer Neg Hx   . Stomach cancer Neg Hx     BP 126/64   Ht 5' 1.5" (  1.562 m)   Wt 107 lb (48.5 kg)   BMI 19.89 kg/m   No flowsheet data found.  No flowsheet data found.  Review of Systems: See HPI above.     Objective:  Physical Exam:  Gen: NAD, comfortable in exam room  Back: No gross deformity. No TTP. FROM. Strength LEs 5/5 all muscle groups except left hip abduction 4/5.   2+ MSRs in patellar and achilles tendons, equal bilaterally. Negative SLRs. Sensation intact to light touch bilaterally.  Left hip: No deformity. FROM with 5/5 strength except left hip abduction 4/5. Tenderness to palpation over IT band including greater trochanter. NVI distally. Negative faber, fadir, piriformis stretch.   Assessment & Plan:  1. 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  Shown home exercises and stretches to do daily.  Icing, topical medications.  Consider steroid injection if not improving.  F/u in 6 weeks  otherwise if doing well.

## 2021-01-16 ENCOUNTER — Encounter: Payer: Self-pay | Admitting: Family Medicine

## 2021-01-16 ENCOUNTER — Other Ambulatory Visit: Payer: Self-pay

## 2021-01-16 ENCOUNTER — Ambulatory Visit (INDEPENDENT_AMBULATORY_CARE_PROVIDER_SITE_OTHER): Payer: Medicare HMO | Admitting: Family Medicine

## 2021-01-16 VITALS — BP 132/70 | Ht 61.25 in | Wt 106.0 lb

## 2021-01-16 DIAGNOSIS — M25552 Pain in left hip: Secondary | ICD-10-CM | POA: Diagnosis not present

## 2021-01-16 MED ORDER — METHYLPREDNISOLONE ACETATE 80 MG/ML IJ SUSP
80.0000 mg | Freq: Once | INTRAMUSCULAR | Status: AC
  Administered 2021-01-16: 80 mg via INTRA_ARTICULAR

## 2021-01-16 NOTE — Progress Notes (Signed)
Patient returned today for left greater trochanteric bursa injection.  After informed written consent timeout was performed, patient was lying on right side on exam table.  Area overlying left trochanteric bursa prepped with alcohol swab then injected with 6:1 lidocaine: depomedrol.  Patient tolerated procedure well without immediate complications.

## 2021-01-30 DIAGNOSIS — M25552 Pain in left hip: Secondary | ICD-10-CM | POA: Diagnosis not present

## 2021-02-01 ENCOUNTER — Telehealth: Payer: Self-pay | Admitting: Family Medicine

## 2021-02-01 NOTE — Telephone Encounter (Signed)
Patient as for call back has ongoing upper thigh pain

## 2021-02-02 ENCOUNTER — Other Ambulatory Visit: Payer: Self-pay

## 2021-02-02 ENCOUNTER — Ambulatory Visit (INDEPENDENT_AMBULATORY_CARE_PROVIDER_SITE_OTHER): Payer: Medicare HMO | Admitting: Sports Medicine

## 2021-02-02 DIAGNOSIS — M5137 Other intervertebral disc degeneration, lumbosacral region: Secondary | ICD-10-CM

## 2021-02-02 NOTE — Progress Notes (Addendum)
    SUBJECTIVE:   CHIEF COMPLAINT / HPI:   Left sided thigh pain  PERTINENT  PMH / PSH:  Patient treated this past March for symptoms of iliotibial band syndrome She improved with home exercises When this recurred she was seen at [facility] and felt to have some radicular symptoms She was sent to physical therapy They gave her some standard flexion exercises but she had more significant pain with knee-to-chest and with crossover exercises  She is coming here for an opinion  She has a history of significant scoliosis  Review of systems  pain radiates into her left lateral leg sometimes down below the knee She does not experience weakness or giving way in the left leg  OBJECTIVE:   BP 110/78   Ht 5' 1.5" (1.562 m)   Wt 106 lb (48.1 kg)   BMI 19.70 kg/m    General: Alert, no acute distress Cardio: Normal S1 and S2, RRR, no r/m/g Pulm: CTAB, normal work of breathing Abdomen: Bowel sounds normal. Abdomen soft and non-tender.  Extremities: No peripheral edema.  Neuro: Cranial nerves grossly intact  On examination patient noted to have some scoliosis concave to the left in the lumbar area Note a right-sided lateral T stretch seems to relieve pain She has good flexion Back extension causes some pain Lateral bending and rotation are pain-free to the right but slight pain on the left Reflexes are unremarkable Hip abduction strength is improved Hamstring stretch to the left biceps femoris creates a sharp cramping pain   ASSESSMENT/PLAN:   No problem-specific Assessment & Plan notes found for this encounter.     Carollee Leitz, MD [place]   I observed and examined the patient with the resident and agree with assessment and plan.  Note reviewed and modified by me. Please see my note regarding her lumbar spine issues.  Trial with conservative home exercises and stretches at first and follow up to see if more needs to be done.  Ila Mcgill, MD

## 2021-02-02 NOTE — Assessment & Plan Note (Signed)
With the patient's known degenerative lumbar disc disease since 2009 I think her symptoms of iliotibial band and lateral hip pain are related to radicular symptoms  This is probably worsened by her scoliosis  She was given T stretches to the right and with rotation to the right Easy flexion lateral bending and rotation of the lumbar spine Ice or heat if these relieve symptoms  Try this strategy for 1 month and if not improving we could consider a low-dose of gabapentin at night

## 2021-02-02 NOTE — Patient Instructions (Signed)
It was a pleasure meeting you today.  Please stop the exercises given to you by physical therapy for now.  Start these new exercises provided twice a day along with short walks.  Do not go do these exercises to produce pain.  Continue Tylenol as needed  Follow up in one month.  If you have any worsening symptoms please call the clinic  Carollee Leitz, MD [facility]

## 2021-02-02 NOTE — Telephone Encounter (Signed)
Called and spoke to patient - she is scheduled to see Dr. Oneida Alar today.  Continues to have pain primarily in thigh laterally, posteriorly.  She had some benefit with injection and has been doing physical therapy - confused because therapist said pain is from sciatica and having her do back extensions, noting pain in leg is overall not improving and concerned because she has a trip to [place] planned in June.  Wonder if ITB pain has improved and dealing with different issue versus ITB/gluteal pain is referred from low back.  Has appointment at 3:30 to be evaluated.

## 2021-02-07 ENCOUNTER — Other Ambulatory Visit: Payer: Self-pay

## 2021-02-07 DIAGNOSIS — M51379 Other intervertebral disc degeneration, lumbosacral region without mention of lumbar back pain or lower extremity pain: Secondary | ICD-10-CM

## 2021-02-07 DIAGNOSIS — M5137 Other intervertebral disc degeneration, lumbosacral region: Secondary | ICD-10-CM

## 2021-02-07 NOTE — Progress Notes (Signed)
Pt called asking to get and MRI of her back.  Order placed.  Pt will call [facility] to schedule.

## 2021-02-09 ENCOUNTER — Other Ambulatory Visit: Payer: Self-pay | Admitting: Sports Medicine

## 2021-02-09 MED ORDER — PREDNISONE 20 MG PO TABS
20.0000 mg | ORAL_TABLET | Freq: Two times a day (BID) | ORAL | 0 refills | Status: DC
Start: 2021-02-09 — End: 2021-11-21

## 2021-02-11 ENCOUNTER — Ambulatory Visit
Admission: RE | Admit: 2021-02-11 | Discharge: 2021-02-11 | Disposition: A | Discharge status: Home / Self Care | Payer: Medicare HMO | Source: Ambulatory Visit | Attending: Sports Medicine | Admitting: Sports Medicine

## 2021-02-11 DIAGNOSIS — M48061 Spinal stenosis, lumbar region without neurogenic claudication: Secondary | ICD-10-CM | POA: Diagnosis not present

## 2021-02-11 DIAGNOSIS — M5137 Other intervertebral disc degeneration, lumbosacral region: Secondary | ICD-10-CM

## 2021-02-11 DIAGNOSIS — M545 Low back pain, unspecified: Secondary | ICD-10-CM | POA: Diagnosis not present

## 2021-02-14 ENCOUNTER — Ambulatory Visit: Payer: Medicare HMO | Admitting: Sports Medicine

## 2021-02-16 ENCOUNTER — Other Ambulatory Visit: Payer: Self-pay | Admitting: Family Medicine

## 2021-02-16 ENCOUNTER — Other Ambulatory Visit: Payer: Self-pay

## 2021-02-16 ENCOUNTER — Encounter: Payer: Self-pay | Admitting: Family Medicine

## 2021-02-16 DIAGNOSIS — M5137 Other intervertebral disc degeneration, lumbosacral region: Secondary | ICD-10-CM

## 2021-02-16 DIAGNOSIS — M51379 Other intervertebral disc degeneration, lumbosacral region without mention of lumbar back pain or lower extremity pain: Secondary | ICD-10-CM

## 2021-02-16 MED ORDER — GABAPENTIN 300 MG PO CAPS: 300.0000 mg | ORAL_CAPSULE | Freq: Every day | ORAL | 0 refills | Status: DC

## 2021-02-16 NOTE — Progress Notes (Unsigned)
Spoke with Dr. Gershon Crane on patient's DPR regarding patient.  Reviewed her MRI as well.  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  Dr. Donald Pore office reported to patient they hadn't received the referral though it was faxed 2 days ago - we will contact them and send it again.  She was also advised it may take until June to get in with him so we may need to try a different provider in that office.  In meantime we will put in referral for ESI at L3-4 level on the left, advise her to call us a week after this to let us know how she's doing.  Can consider short course of pain medication if needed as well.

## 2021-02-16 NOTE — Progress Notes (Signed)
Spoke with patient re: plan (see prior documentation).  Will also start gabapentin 300mg  at bedtime - discussed risk of somnolence.  Will set up ESI as noted.

## 2021-02-18 ENCOUNTER — Other Ambulatory Visit: Payer: Self-pay

## 2021-02-18 ENCOUNTER — Emergency Department (HOSPITAL_COMMUNITY)
Admission: EM | Admit: 2021-02-18 | Discharge: 2021-02-18 | Disposition: A | Discharge status: Home / Self Care | Payer: Medicare HMO | Attending: Emergency Medicine | Admitting: Emergency Medicine

## 2021-02-18 ENCOUNTER — Encounter (HOSPITAL_COMMUNITY): Payer: Self-pay

## 2021-02-18 DIAGNOSIS — R55 Syncope and collapse: Secondary | ICD-10-CM | POA: Diagnosis not present

## 2021-02-18 DIAGNOSIS — Z79899 Other long term (current) drug therapy: Secondary | ICD-10-CM | POA: Insufficient documentation

## 2021-02-18 DIAGNOSIS — E039 Hypothyroidism, unspecified: Secondary | ICD-10-CM | POA: Diagnosis not present

## 2021-02-18 DIAGNOSIS — R1111 Vomiting without nausea: Secondary | ICD-10-CM | POA: Diagnosis not present

## 2021-02-18 DIAGNOSIS — I959 Hypotension, unspecified: Secondary | ICD-10-CM | POA: Diagnosis not present

## 2021-02-18 LAB — BASIC METABOLIC PANEL
Anion gap: 13 (ref 5–15)
BUN: 17 mg/dL (ref 8–23)
CO2: 23 mmol/L (ref 22–32)
Calcium: 8.5 mg/dL — ABNORMAL LOW (ref 8.9–10.3)
Chloride: 100 mmol/L (ref 98–111)
Creatinine, Ser: 0.57 mg/dL (ref 0.44–1.00)
GFR, Estimated: >60 mL/min (ref >60)
Glucose, Bld: 90 mg/dL (ref 70–99)
Potassium: 3.2 mmol/L — ABNORMAL LOW (ref 3.5–5.1)
Sodium: 136 mmol/L (ref 135–145)

## 2021-02-18 LAB — CBC WITH DIFFERENTIAL/PLATELET
Abs Immature Granulocytes: 0.06 10*3/uL (ref 0.00–0.07)
Basophils Absolute: 0.1 10*3/uL (ref 0.0–0.1)
Basophils Relative: 1 %
Eosinophils Absolute: 0.1 10*3/uL (ref 0.0–0.5)
Eosinophils Relative: 1 %
HCT: 42.4 % (ref 36.0–46.0)
Hemoglobin: 14.2 g/dL (ref 12.0–15.0)
Immature Granulocytes: 1 %
Lymphocytes Relative: 18 %
Lymphs Abs: 1.6 10*3/uL (ref 0.7–4.0)
MCH: 32.9 pg (ref 26.0–34.0)
MCHC: 33.5 g/dL (ref 30.0–36.0)
MCV: 98.4 fL (ref 80.0–100.0)
Monocytes Absolute: 0.8 10*3/uL (ref 0.1–1.0)
Monocytes Relative: 10 %
Neutro Abs: 6.2 10*3/uL (ref 1.7–7.7)
Neutrophils Relative %: 69 %
Platelets: 251 10*3/uL (ref 150–400)
RBC: 4.31 MIL/uL (ref 3.87–5.11)
RDW: 13.1 % (ref 11.5–15.5)
WBC: 8.8 10*3/uL (ref 4.0–10.5)
nRBC: 0 % (ref 0.0–0.2)

## 2021-02-18 LAB — CBG MONITORING, ED: Glucose-Capillary: 83 mg/dL (ref 70–99)

## 2021-02-18 MED ORDER — SODIUM CHLORIDE 0.9 % IV BOLUS
1000.0000 mL | Freq: Once | INTRAVENOUS | Status: AC
  Administered 2021-02-18: 1000 mL via INTRAVENOUS

## 2021-02-18 NOTE — ED Provider Notes (Signed)
[place] DEPT Provider Note   CSN: [id number] Arrival date & time: 02/18/21  1752     History No chief complaint on file.   Glenda Perez is a 80 y.o. female.  80 yo F with a chief complaint of a syncopal event.  Patient states that she was at a party she thought there would be more food and she had some wine and then she passed out.  She is not sure exactly what transpired, but there were multiple people of this event and said that she was standing and talking to someone when all of a sudden she had a blank gaze and she slumped against the wall and slowly slid downward.  They deny any traumatic event.  They think she shook once or twice and then she was awake when she was on the ground.  She tried to quickly sit back up and she had a recurrent event.  Had an episode of emesis.  She denies head injury denies neck pain denies headache denies chest pain or shortness of breath denies nausea vomiting or diarrhea otherwise.  Had breakfast but otherwise did not have anything to eat or drink today.  Has had an event where she passed out a couple times in the past.  The history is provided by the patient.  Illness Severity:  Moderate Onset quality:  Gradual Duration:  2 minutes Timing:  Rare Progression:  Resolved Chronicity:  Recurrent Associated symptoms: no abdominal pain, no chest pain, no cough, no fatigue, no fever, no headaches, no nausea, no shortness of breath and no vomiting        Past Medical History:  Diagnosis Date  . Anxiety    clonezapam  . Osteopenia   . Thyroid disease     Patient Active Problem List   Diagnosis Date Noted  . H/O Graves' disease 04/04/2017  . 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 Idiopathic scoliosis 04/13/2015  . 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 Problems related to lack of adequate sleep 12/20/2011  . 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 Arm pain, right 02/01/2011  . Radiculopathy of arm  02/01/2011  . CONSTIPATION 05/17/2008  . 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 DEGEN LUMBAR/LUMBOSACRAL INTERVERTEBRAL DISC 05/12/2008  . LOW BACK PAIN SYNDROME 05/12/2008  . 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 BUNIONS, BILATERAL 12/16/2007  . CALF PAIN, LEFT 12/16/2007    Past Surgical History:  Procedure Laterality Date  . BIOPSY BREAST    . COLONOSCOPY    . OOPHORECTOMY    . UPPER GASTROINTESTINAL ENDOSCOPY       OB History   No obstetric history on file.     Family History  Problem Relation Age of Onset  . Colon cancer Neg Hx   . Esophageal cancer Neg Hx   . Pancreatic cancer Neg Hx   . Rectal cancer Neg Hx   . Stomach cancer Neg Hx     Social History   Tobacco Use  . Smoking status: Never Smoker  . Smokeless tobacco: Never Used  Substance Use Topics  . Alcohol use: Yes    Alcohol/week: 7.0 standard drinks    Types: 7 Glasses of wine per week  . Drug use: No    Home Medications Prior to Admission medications   Medication Sig Start Date End Date Taking? Authorizing Provider  cholecalciferol (VITAMIN D) 1000 UNITS tablet Take 1,000 Units by mouth daily.   Yes [provider]  clonazePAM (KLONOPIN) 0.5 MG tablet  Take 0.5 mg by mouth at bedtime.   Yes [provider]  fish oil-omega-3 fatty acids 1000 MG capsule Take 1 g by mouth daily.   Yes [provider]  levothyroxine (SYNTHROID) 75 MCG tablet TAKE 1 TABLET EVERY DAY 03/24/20  Yes Philemon Kingdom, MD  Selenium 100 MCG CAPS Take 200 mcg by mouth daily.    Yes [provider]  TURMERIC PO Take 200 mg by mouth daily.    Yes [provider]  gabapentin (NEURONTIN) 300 MG capsule Take 1 capsule (300 mg total) by mouth at bedtime. Patient not taking: No sig reported 02/16/21   Hudnall, Sharyn Lull, MD  predniSONE (DELTASONE) 20 MG tablet Take 1 tablet (20 mg total) by mouth 2 (two) times daily. Patient not taking: Reported on 02/18/2021  02/09/21   Stefanie Libel, MD    Allergies    Amoxicillin, Erythromycin, Ibuprofen, and Other  Review of Systems   Review of Systems  Constitutional: Negative for fatigue and fever.  Respiratory: Negative for cough and shortness of breath.   Cardiovascular: Negative for chest pain.  Gastrointestinal: Negative for abdominal pain, nausea and vomiting.  Neurological: Positive for syncope. Negative for headaches.    Physical Exam Updated Vital Signs BP (!) 160/77   Pulse 77   Temp (!) 97.4 F (36.3 C) (Oral)   Resp (!) 22   SpO2 100%   Physical Exam Vitals and nursing note reviewed.  Constitutional:      General: She is not in acute distress.    Appearance: She is well-developed. She is not diaphoretic.  HENT:     Head: Normocephalic and atraumatic.     Comments: Tacky mucous membranes Eyes:     Pupils: Pupils are equal, round, and reactive to light.  Cardiovascular:     Rate and Rhythm: Normal rate and regular rhythm.     Heart sounds: No murmur heard. No friction rub. No gallop.   Pulmonary:     Effort: Pulmonary effort is normal.     Breath sounds: No wheezing or rales.  Abdominal:     General: There is no distension.     Palpations: Abdomen is soft.     Tenderness: There is no abdominal tenderness.  Musculoskeletal:        General: No tenderness.     Cervical back: Normal range of motion and neck supple.  Skin:    General: Skin is warm and dry.  Neurological:     Mental Status: She is alert and oriented to person, place, and time.  Psychiatric:        Behavior: Behavior normal.     ED Results / Procedures / Treatments   Labs (all labs ordered are listed, but only abnormal results are displayed) Labs Reviewed  BASIC METABOLIC PANEL - Abnormal; Notable for the following components:      Result Value   Potassium 3.2 (*)    Calcium 8.5 (*)    All other components within normal limits  CBC WITH DIFFERENTIAL/PLATELET  CBG MONITORING, ED    EKG EKG  Interpretation  Date/Time:  Saturday February 18 2021 18:13:50 EDT Ventricular Rate:  64 PR Interval:  160 QRS Duration: 111 QT Interval:  438 QTC Calculation: 452 R Axis:   -8 Text Interpretation: Sinus rhythm Probable left atrial enlargement Artifact in lead(s) I II III aVR aVL aVF V1 V2 V5 no wpw, prolonged qt or brugada No significant change since last tracing Confirmed by Deno Etienne [phone]) on 02/18/2021 6:32:35  PM   Radiology No results found.  Procedures Procedures   Medications Ordered in ED Medications  sodium chloride 0.9 % bolus 1,000 mL (1,000 mLs Intravenous New Bag/Given 02/18/21 1858)    ED Course  I have reviewed the triage vital signs and the nursing notes.  Pertinent labs & imaging results that were available during my care of the patient were reviewed by me and considered in my medical decision making (see chart for details).    MDM Rules/Calculators/A&P                          80 yo F with a chief complaint of a syncopal event.  Sounds vasovagal versus hypovolemia by history.  Appears dehydrated on exam.  Will give a bolus of IV fluids.  Blood work.  Reassess.  Patient is also complaining of left-sided low back pain has been ongoing for some time.  She has a scheduled injection on the second of next month.  She denies any cauda equina symptoms no loss of bowel or bladder no loss of perirectal sensation no numbness or weakness to the leg.  No anemia, potassium mildly low at 3.2.   Patient has been able to eat and drink here without issue.  Continues to be asymptomatic.  Will discharge home.  PCP follow-up.  8:05 PM:  I have discussed the diagnosis/risks/treatment options with the patient and friend and believe the pt to be eligible for discharge home to follow-up with PCP. We also discussed returning to the ED immediately if new or worsening sx occur. We discussed the sx which are most concerning (e.g., sudden worsening pain, fever, inability to tolerate by  mouth) that necessitate immediate return. Medications administered to the patient during their visit and any new prescriptions provided to the patient are listed below.  Medications given during this visit Medications  sodium chloride 0.9 % bolus 1,000 mL (1,000 mLs Intravenous New Bag/Given 02/18/21 1858)     The patient appears reasonably screen and/or stabilized for discharge and I doubt any other medical condition or other [facility] requiring further screening, evaluation, or treatment in the ED at this time prior to discharge.     Final Clinical Impression(s) / ED Diagnoses Final diagnoses:  Syncope and collapse    Rx / DC Orders ED Discharge Orders    None       Deno Etienne, DO 02/18/21 2006

## 2021-02-18 NOTE — ED Triage Notes (Addendum)
Patient coming from party with c/o syncope episode. Patient report having a glass of wine today. Patient have no pain. Patient given 500 ml of NS per EMS.

## 2021-02-18 NOTE — ED Notes (Signed)
Pt given water, juice and crackers, tolerating well.

## 2021-02-18 NOTE — Discharge Instructions (Signed)
Eat and drink as well as you can for the next 48 hours.  Please return for repeat episode chest pain shortness of breath headache or neck pain.

## 2021-02-21 ENCOUNTER — Ambulatory Visit
Admission: RE | Admit: 2021-02-21 | Discharge: 2021-02-21 | Disposition: A | Discharge status: Home / Self Care | Payer: Medicare HMO | Source: Ambulatory Visit | Attending: Family Medicine | Admitting: Family Medicine

## 2021-02-21 ENCOUNTER — Other Ambulatory Visit: Payer: Self-pay

## 2021-02-21 DIAGNOSIS — M5126 Other intervertebral disc displacement, lumbar region: Secondary | ICD-10-CM | POA: Diagnosis not present

## 2021-02-21 DIAGNOSIS — M5137 Other intervertebral disc degeneration, lumbosacral region: Secondary | ICD-10-CM

## 2021-02-21 DIAGNOSIS — M47817 Spondylosis without myelopathy or radiculopathy, lumbosacral region: Secondary | ICD-10-CM | POA: Diagnosis not present

## 2021-02-21 MED ORDER — IOPAMIDOL (ISOVUE-M 200) INJECTION 41%
1.0000 mL | Freq: Once | INTRAMUSCULAR | Status: AC
  Administered 2021-02-21: 1 mL via EPIDURAL

## 2021-02-21 MED ORDER — METHYLPREDNISOLONE ACETATE 40 MG/ML INJ SUSP (RADIOLOG
80.0000 mg | Freq: Once | INTRAMUSCULAR | Status: AC
  Administered 2021-02-21: 80 mg via EPIDURAL

## 2021-02-21 NOTE — Discharge Instructions (Signed)

## 2021-02-23 DIAGNOSIS — M5442 Lumbago with sciatica, left side: Secondary | ICD-10-CM | POA: Diagnosis not present

## 2021-02-23 DIAGNOSIS — G8929 Other chronic pain: Secondary | ICD-10-CM | POA: Diagnosis not present

## 2021-02-23 DIAGNOSIS — F419 Anxiety disorder, unspecified: Secondary | ICD-10-CM | POA: Diagnosis not present

## 2021-02-23 DIAGNOSIS — R55 Syncope and collapse: Secondary | ICD-10-CM | POA: Diagnosis not present

## 2021-02-28 ENCOUNTER — Ambulatory Visit: Payer: Medicare HMO | Admitting: Sports Medicine

## 2021-03-02 ENCOUNTER — Ambulatory Visit: Payer: Medicare HMO | Admitting: Sports Medicine

## 2021-03-02 ENCOUNTER — Other Ambulatory Visit: Payer: Self-pay

## 2021-03-02 VITALS — BP 122/78 | Ht 61.5 in | Wt 107.0 lb

## 2021-03-02 DIAGNOSIS — M5137 Other intervertebral disc degeneration, lumbosacral region: Secondary | ICD-10-CM

## 2021-03-02 NOTE — Assessment & Plan Note (Signed)
She got significant relief with her first epidural injection For that reason we will go ahead with a second epidural injection She has a planned trip to [place] in the summer and would like to be able to go if the pain resolves enough  I will start her on some easy walking at home and easy motion exercises for her back Her second epidural is planned in about 1 week Recheck by me in 2 weeks

## 2021-03-02 NOTE — Patient Instructions (Addendum)
Your exam really looks good today and much better than last visit  Each day do some easy flexion and extension of your back - 5 times Do easy side to side motion for your back - 5 times  Start walking about 10 minutes at a time 3 times per day  Be careful with stairs  I don't see any change in medicines at this point.  I will check when you should have additional injections  2 weeks see me in clinic

## 2021-03-02 NOTE — Progress Notes (Signed)
Chief complaint lumbar radiculopathy affecting the left leg  Patient was having left hip and leg pain which ultimately proved to be related to her degenerative lumbar spine disease She has since that time had an MRI which showed significant degenerative change and some foraminal stenosis She does have scoliosis She had an epidural injection almost 2 weeks ago and clearly has gotten some relief from her worse symptoms She is coming back for reevaluation  Since the injection she has stayed active and actually traveled up to see her daughter in [place] of systems Pain is worsens if she walks steps in particular Toward the end of the day she starts getting some weakness Sleep pattern is good  Physical exam Pleasant thin female in no acute distress BP 122/78   Ht 5' 1.5" (1.562 m)   Wt 107 lb (48.5 kg)   BMI 19.89 kg/m  [place] Adult Exercise 01/16/2021  Frequency of aerobic exercise (# of days/week) 5  Average time in minutes 30  Frequency of strengthening activities (# of days/week) 1   Today she is comfortable sitting She can do full flexion without pain She gets some tightness with back extension She can do lateral lumbar lean to the right and left without pain  She is able to do heel and toe walk She is able to do crossover walk  Strength testing reveals good strength in the left lower extremity  Reflexes are preserved

## 2021-03-03 ENCOUNTER — Other Ambulatory Visit: Payer: Self-pay | Admitting: Sports Medicine

## 2021-03-03 DIAGNOSIS — M5137 Other intervertebral disc degeneration, lumbosacral region: Secondary | ICD-10-CM

## 2021-03-10 ENCOUNTER — Other Ambulatory Visit: Payer: Medicare HMO

## 2021-03-13 ENCOUNTER — Ambulatory Visit
Admission: RE | Admit: 2021-03-13 | Discharge: 2021-03-13 | Disposition: A | Discharge status: Home / Self Care | Payer: Medicare HMO | Source: Ambulatory Visit | Attending: Sports Medicine | Admitting: Sports Medicine

## 2021-03-13 DIAGNOSIS — M5137 Other intervertebral disc degeneration, lumbosacral region: Secondary | ICD-10-CM

## 2021-03-13 DIAGNOSIS — M5126 Other intervertebral disc displacement, lumbar region: Secondary | ICD-10-CM | POA: Diagnosis not present

## 2021-03-13 MED ORDER — IOPAMIDOL (ISOVUE-M 200) INJECTION 41%
1.0000 mL | Freq: Once | INTRAMUSCULAR | Status: AC
  Administered 2021-03-13: 1 mL via EPIDURAL

## 2021-03-13 MED ORDER — METHYLPREDNISOLONE ACETATE 40 MG/ML INJ SUSP (RADIOLOG
80.0000 mg | Freq: Once | INTRAMUSCULAR | Status: AC
  Administered 2021-03-13: 80 mg via EPIDURAL

## 2021-03-13 NOTE — Discharge Instructions (Signed)

## 2021-03-16 DIAGNOSIS — Z79899 Other long term (current) drug therapy: Secondary | ICD-10-CM | POA: Diagnosis not present

## 2021-03-24 DIAGNOSIS — R55 Syncope and collapse: Secondary | ICD-10-CM | POA: Diagnosis not present

## 2021-04-10 ENCOUNTER — Other Ambulatory Visit: Payer: Self-pay | Admitting: Internal Medicine

## 2021-04-17 DIAGNOSIS — M5442 Lumbago with sciatica, left side: Secondary | ICD-10-CM | POA: Diagnosis not present

## 2021-04-17 DIAGNOSIS — G8929 Other chronic pain: Secondary | ICD-10-CM | POA: Diagnosis not present

## 2021-04-17 DIAGNOSIS — M47816 Spondylosis without myelopathy or radiculopathy, lumbar region: Secondary | ICD-10-CM | POA: Diagnosis not present

## 2021-04-17 DIAGNOSIS — M48061 Spinal stenosis, lumbar region without neurogenic claudication: Secondary | ICD-10-CM | POA: Diagnosis not present

## 2021-04-17 DIAGNOSIS — M4120 Other idiopathic scoliosis, site unspecified: Secondary | ICD-10-CM | POA: Diagnosis not present

## 2021-05-10 DIAGNOSIS — R55 Syncope and collapse: Secondary | ICD-10-CM | POA: Diagnosis not present

## 2021-05-12 DIAGNOSIS — I351 Nonrheumatic aortic (valve) insufficiency: Secondary | ICD-10-CM | POA: Diagnosis not present

## 2021-05-12 DIAGNOSIS — Z0000 Encounter for general adult medical examination without abnormal findings: Secondary | ICD-10-CM | POA: Diagnosis not present

## 2021-05-12 DIAGNOSIS — I700 Atherosclerosis of aorta: Secondary | ICD-10-CM | POA: Diagnosis not present

## 2021-05-12 DIAGNOSIS — E039 Hypothyroidism, unspecified: Secondary | ICD-10-CM | POA: Diagnosis not present

## 2021-05-12 DIAGNOSIS — Z79899 Other long term (current) drug therapy: Secondary | ICD-10-CM | POA: Diagnosis not present

## 2021-05-12 DIAGNOSIS — F419 Anxiety disorder, unspecified: Secondary | ICD-10-CM | POA: Diagnosis not present

## 2021-05-12 DIAGNOSIS — E7800 Pure hypercholesterolemia, unspecified: Secondary | ICD-10-CM | POA: Diagnosis not present

## 2021-05-12 DIAGNOSIS — E559 Vitamin D deficiency, unspecified: Secondary | ICD-10-CM | POA: Diagnosis not present

## 2021-05-29 DIAGNOSIS — M4120 Other idiopathic scoliosis, site unspecified: Secondary | ICD-10-CM | POA: Diagnosis not present

## 2021-05-29 DIAGNOSIS — M48061 Spinal stenosis, lumbar region without neurogenic claudication: Secondary | ICD-10-CM | POA: Diagnosis not present

## 2021-05-29 DIAGNOSIS — M47816 Spondylosis without myelopathy or radiculopathy, lumbar region: Secondary | ICD-10-CM | POA: Diagnosis not present

## 2021-05-29 DIAGNOSIS — M5136 Other intervertebral disc degeneration, lumbar region: Secondary | ICD-10-CM | POA: Diagnosis not present

## 2021-06-14 DIAGNOSIS — N9089 Other specified noninflammatory disorders of vulva and perineum: Secondary | ICD-10-CM | POA: Diagnosis not present

## 2021-07-03 ENCOUNTER — Other Ambulatory Visit: Payer: Self-pay

## 2021-07-03 ENCOUNTER — Ambulatory Visit: Payer: Medicare HMO | Admitting: Dermatology

## 2021-07-03 ENCOUNTER — Encounter: Payer: Self-pay | Admitting: Dermatology

## 2021-07-03 DIAGNOSIS — D692 Other nonthrombocytopenic purpura: Secondary | ICD-10-CM | POA: Diagnosis not present

## 2021-07-03 DIAGNOSIS — B079 Viral wart, unspecified: Secondary | ICD-10-CM | POA: Diagnosis not present

## 2021-07-03 DIAGNOSIS — L821 Other seborrheic keratosis: Secondary | ICD-10-CM

## 2021-07-03 DIAGNOSIS — Z1283 Encounter for screening for malignant neoplasm of skin: Secondary | ICD-10-CM

## 2021-07-03 DIAGNOSIS — D485 Neoplasm of uncertain behavior of skin: Secondary | ICD-10-CM

## 2021-07-03 NOTE — Patient Instructions (Addendum)
Over the counter- Dermend   Biopsy, Surgery (Curettage) & Surgery (Excision) Aftercare Instructions  1. Okay to remove bandage in 24 hours  2. Wash area with soap and water  3. Apply Vaseline to area twice daily until healed (Not Neosporin)  4. Okay to cover with a Band-Aid to decrease the chance of infection or prevent irritation from clothing; also it's okay to uncover lesion at home.  5. Suture instructions: return to our office in 7-10 or 10-14 days for a nurse visit for suture removal. Variable healing with sutures, if pain or itching occurs call our office. It's okay to shower or bathe 24 hours after sutures are given.  6. The following risks may occur after a biopsy, curettage or excision: bleeding, scarring, discoloration, recurrence, infection (redness, yellow drainage, pain or swelling).  7. For questions, concerns and results call our office at [place] before 4pm & Friday before 3pm. Biopsy results will be available in 1 week.

## 2021-07-05 ENCOUNTER — Telehealth: Payer: Self-pay | Admitting: Dermatology

## 2021-07-05 NOTE — Telephone Encounter (Signed)
No phone call to patient due to visit not being closed and patient requesting no phone call if nothing new has been updated from her previous visit.

## 2021-07-05 NOTE — Telephone Encounter (Signed)
Said she got email to [employer] but there was nothing new there that she didn't already know from after visit summary. If we got something new, like biopsy results, please call her back; if not, don't bother calling her.

## 2021-07-14 ENCOUNTER — Encounter: Payer: Self-pay | Admitting: Dermatology

## 2021-07-14 NOTE — Progress Notes (Signed)
   Follow-Up Visit   Subjective  Glenda Perez is a 80 y.o. female who presents for the following: Annual Exam (Left chest x 1 month- no itch no bleed).  Growth on chest; general skin examination. Location:  Duration:  Quality:  Associated Signs/Symptoms: Modifying Factors:  Severity:  Timing: Context:   Objective  Well appearing patient in no apparent distress; mood and affect are within normal limits. Torso - Posterior (Back) Full body skin examination: No atypical pigmented lesions.  1 hornlike papule on chest will be biopsied.  Left Chest 5 mm verrucous pink papule.       Right Abdomen (side) - Upper Textured brown 6 mm flattopped papule  Right Forearm - Anterior 1 cm ecchymoses; no history of other abnormal bleeding.    A full examination was performed including scalp, head, eyes, ears, nose, lips, neck, chest, axillae, abdomen, back, buttocks, bilateral upper extremities, bilateral lower extremities, hands, feet, fingers, toes, fingernails, and toenails. All findings within normal limits unless otherwise noted below.  Areas beneath undergarments not fully examined.   Assessment & Plan    Encounter for screening for malignant neoplasm of skin Torso - Posterior (Back)  Annual skin examination.  Neoplasm of uncertain behavior of skin Left Chest  Skin / nail biopsy Type of biopsy: tangential   Informed consent: discussed and consent obtained   Timeout: patient name, date of birth, surgical site, and procedure verified   Procedure prep:  Patient was prepped and draped in usual sterile fashion (Non sterile) Prep type:  Chlorhexidine Anesthesia: the lesion was anesthetized in a standard fashion   Anesthetic:  1% lidocaine w/ epinephrine 1-100,000 local infiltration Instrument used: flexible razor blade   Outcome: patient tolerated procedure well   Post-procedure details: wound care instructions given    Destruction of lesion Complexity: simple    Destruction method: electrodesiccation and curettage   Informed consent: discussed and consent obtained   Timeout:  patient name, date of birth, surgical site, and procedure verified Anesthesia: the lesion was anesthetized in a standard fashion   Anesthetic:  1% lidocaine w/ epinephrine 1-100,000 local infiltration Curettage performed in three different directions: Yes   Electrodesiccation performed over the curetted area: Yes   Curettage cycles:  1 Margin per side (cm):  0.1 Final wound size (cm):  0.8 Hemostasis achieved with:  aluminum chloride Outcome: patient tolerated procedure well with no complications   Post-procedure details: wound care instructions given    Specimen 1 - Surgical pathology Differential Diagnosis: bcc vs [id number]  Check Margins: No  Discussed differential diagnosis including sun damage, inflamed keratosis, wart and.  We mutually chose to do shave biopsy with light cautery of base.  Warned of small possibility of keloid.  Seborrheic keratosis Right Abdomen (side) - Upper  No intervention necessary  Solar purpura ([place]) Right Forearm - Anterior  Over the counter dermend      I, Lavonna Monarch, MD, have reviewed all documentation for this visit.  The documentation on 07/14/21 for the exam, diagnosis, procedures, and orders are all accurate and complete.

## 2021-07-15 ENCOUNTER — Encounter: Payer: Self-pay | Admitting: Dermatology

## 2021-07-17 ENCOUNTER — Telehealth: Payer: Self-pay

## 2021-07-17 NOTE — Telephone Encounter (Signed)
Phone call from patient wanting her pathology results. Pathology results given to patient.  

## 2021-08-15 ENCOUNTER — Telehealth: Payer: Medicare HMO | Admitting: Sports Medicine

## 2021-08-21 ENCOUNTER — Ambulatory Visit: Payer: Medicare HMO | Admitting: Dermatology

## 2021-08-30 DIAGNOSIS — M545 Low back pain, unspecified: Secondary | ICD-10-CM | POA: Diagnosis not present

## 2021-09-21 ENCOUNTER — Encounter: Payer: Self-pay | Admitting: Sports Medicine

## 2021-09-25 DIAGNOSIS — M47816 Spondylosis without myelopathy or radiculopathy, lumbar region: Secondary | ICD-10-CM | POA: Diagnosis not present

## 2021-09-25 DIAGNOSIS — M48061 Spinal stenosis, lumbar region without neurogenic claudication: Secondary | ICD-10-CM | POA: Diagnosis not present

## 2021-09-25 DIAGNOSIS — M5442 Lumbago with sciatica, left side: Secondary | ICD-10-CM | POA: Diagnosis not present

## 2021-09-25 DIAGNOSIS — M4120 Other idiopathic scoliosis, site unspecified: Secondary | ICD-10-CM | POA: Diagnosis not present

## 2021-10-05 ENCOUNTER — Other Ambulatory Visit: Payer: Self-pay

## 2021-10-05 ENCOUNTER — Ambulatory Visit: Payer: Medicare HMO | Admitting: Internal Medicine

## 2021-10-05 ENCOUNTER — Encounter: Payer: Self-pay | Admitting: Internal Medicine

## 2021-10-05 VITALS — BP 120/72 | Ht 61.5 in | Wt 106.8 lb

## 2021-10-05 DIAGNOSIS — Z8639 Personal history of other endocrine, nutritional and metabolic disease: Secondary | ICD-10-CM | POA: Diagnosis not present

## 2021-10-05 DIAGNOSIS — E0500 Thyrotoxicosis with diffuse goiter without thyrotoxic crisis or storm: Secondary | ICD-10-CM

## 2021-10-05 DIAGNOSIS — E890 Postprocedural hypothyroidism: Secondary | ICD-10-CM

## 2021-10-05 NOTE — Patient Instructions (Addendum)
Please continue Levothyroxine 75 mcg daily.  Take the thyroid hormone every day, with water, at least 30 minutes before breakfast, separated by at least 4 hours from: - acid reflux medications - calcium - iron - multivitamins  Please return for labs - off Biotin for ~4 days.  Please come back for a follow-up appointment in 1 year.

## 2021-10-05 NOTE — Progress Notes (Addendum)
Patient ID: Glenda Perez, female   DOB: 1941/01/22, 80 y.o.   MRN: [id number]   This visit occurred during the SARS-CoV-2 public health emergency.  Safety protocols were in place, including screening questions prior to the visit, additional usage of staff PPE, and extensive cleaning of exam room while observing appropriate contact time as indicated for disinfecting solutions.   HPI  Glenda Perez is a 80 y.o.-year-old female, initially referred by her PCP, Dr. 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  Last visit 1 year ago. She prev. Saw Dr. Lonna Perez at [place] (notes reviewed). She prev. saw Dr. Tobe Perez and Dr. Buddy Perez.  Interim history: She did not have Covid19 since the beginning of the pandemic.  She saw ophthalmology - this year - no changes, got new glasses. Has mild cataracts.  Reviewed history: Pt. has been dx with hypothyroidism in 2011 after she had RAI ablation for Graves ds. >>  On levothyroxine.  In 2018 we moved levothyroxine from bedtime to a.m.  TFTs remained controlled since then.  Pt is on levothyroxine 75 mcg daily, taken: - in am (when waking up to go to the restroom - the earliest: pm)  - fasting - at least 30 min from b'fast and coffee with half-and-half - no calcium - no iron - no multivitamins - no PPIs - + on Biotin  - powder with collagen in her coffee - last dose this am - 2500 mcg Biotin  Reviewed her TFTs: Lab Results  Component Value Date   TSH 1.03 10/06/2020   TSH 1.82 08/04/2019   TSH 0.45 05/20/2019   TSH 1.32 10/03/2018   TSH 2.00 10/04/2017   TSH 0.51 07/25/2017   TSH 0.09 (L) 05/15/2017   TSH 1.076 08/19/2012   TSH 0.847 05/12/2012   TSH 1.301 02/15/2012   FREET4 0.91 10/06/2020   FREET4 1.16 08/04/2019   FREET4 1.37 05/20/2019   FREET4 0.86 10/03/2018   FREET4 1.05 07/25/2017   FREET4 1.20 05/15/2017   FREET4 1.26 08/19/2012   FREET4 1.31 05/12/2012   FREET4 1.09  02/15/2012   FREET4 1.31 01/16/2012  08/30/2016: TSH 0.704 01/11/2016: TSH 0.605  Her TSIs remain elevated: Lab Results  Component Value Date   TSI 244 (H) 10/06/2020   TSI 404 (H) 08/04/2019   TSI 386 (H) 10/03/2018   TSI >700 (H) 05/15/2017   TSI 487 (H) 05/12/2012   TSI 560 (H) 02/15/2012   TSI 603 (H) 01/16/2012   TSI 539 (H) 12/17/2011   TSI 413 (H) 11/20/2011   TSI 426 (H) 10/17/2011   Pt denies: - feeling nodules in neck - hoarseness - dysphagia - choking - SOB with lying down  No FH of thyroid disease or thyroid cancer. No h/o radiation tx to head or neck.  On Turmeric - 2x a day. No recent steroids use.   She has a history of Graves' ophthalmopathy and sees ophthalmology-Dr. Prudencio Perez.  This improved after she started to take 200 mcg selenium daily.  She also has a h/o reactive hypoglycemia.  ROS: + see HPI  I reviewed pt's medications, allergies, PMH, social hx, family hx, and changes were documented in the history of present illness. Otherwise, unchanged from my initial visit note.  Past Medical History:  Diagnosis Date   Anxiety    clonezapam   Osteopenia    Thyroid disease    Past Surgical History:  Procedure Laterality Date   BIOPSY BREAST     COLONOSCOPY  OOPHORECTOMY     UPPER GASTROINTESTINAL ENDOSCOPY     Social History   Social History   Marital status: Widowed    Spouse name: N/A   Number of children: 1   Occupational History   artist   Social History Main Topics   Smoking status: Never Smoker   Smokeless tobacco: Never Used   Alcohol use     3-5 Glasses of wine per week   Drug use: No   Current Outpatient Medications on File Prior to Visit  Medication Sig Dispense Refill   busPIRone (BUSPAR) 5 MG tablet Take 5 mg by mouth at bedtime. (Patient not taking: Reported on 07/03/2021)     cholecalciferol (VITAMIN D) 1000 UNITS tablet Take 1,000 Units by mouth daily.     clonazePAM (KLONOPIN) 0.5 MG tablet Take 0.5 mg by mouth at  bedtime.     fish oil-omega-3 fatty acids 1000 MG capsule Take 1 g by mouth daily.     gabapentin (NEURONTIN) 300 MG capsule Take 1 capsule (300 mg total) by mouth at bedtime. (Patient not taking: Reported on 07/03/2021) 30 capsule 0   levothyroxine (SYNTHROID) 75 MCG tablet TAKE 1 TABLET EVERY DAY 90 tablet 3   predniSONE (DELTASONE) 20 MG tablet Take 1 tablet (20 mg total) by mouth 2 (two) times daily. (Patient not taking: Reported on 07/03/2021) 14 tablet 0   Selenium 100 MCG CAPS Take 200 mcg by mouth daily.      TURMERIC PO Take 200 mg by mouth daily.      No current facility-administered medications on file prior to visit.   Allergies  Allergen Reactions   Amoxicillin Rash    Has patient had a PCN reaction causing immediate rash, facial/tongue/throat swelling, SOB or lightheadedness with hypotension:No Has patient had a PCN reaction causing severe rash involving mucus membranes or skin necrosis:No Has patient had a PCN reaction that required hospitalization:No Has patient had a PCN reaction occurring within the last 10 years:Yes If all of the above answers are "NO", then may proceed with Cephalosporin use.    Erythromycin    Ibuprofen    Other Rash    All cillins Other reaction(s): tingling in fingers   Family History  Problem Relation Age of Onset   Colon cancer Neg Hx    Esophageal cancer Neg Hx    Pancreatic cancer Neg Hx    Rectal cancer Neg Hx    Stomach cancer Neg Hx     PE: BP 120/72 (BP Location: Right Arm, Patient Position: Sitting, Cuff Size: Normal)    Ht 5' 1.5" (1.562 m)    Wt 106 lb 12.8 oz (48.4 kg)    BMI 19.85 kg/m  Wt Readings from Last 3 Encounters:  10/05/21 106 lb 12.8 oz (48.4 kg)  03/02/21 107 lb (48.5 kg)  02/02/21 106 lb (48.1 kg)   Constitutional: normal weight, in NAD Eyes: PERRLA, EOMI, + B mild exophthalmos and stare ENT: moist mucous membranes, no thyromegaly, no cervical lymphadenopathy Cardiovascular: RRR, No MRG Respiratory: CTA  B Musculoskeletal: no deformities, strength intact in all 4 Skin: moist, warm, no rashes, + bluish discoloration of the first 3 fingers in bilateral hands (has a history of Raynaud's phenomenon) Neurological: no tremor with outstretched hands, DTR normal in all 4  ASSESSMENT: 1. Postablative Hypothyroidism - h/o Graves ds.  2. Graves ophthalmopathy (GO)  3.  History of Graves' disease  PLAN:  1. Patient with longstanding post ablative hypothyroidism, on levothyroxine therapy - latest thyroid labs  reviewed with pt. >> normal: Lab Results  Component Value Date   TSH 1.03 10/06/2020  - she continues on LT4 75 mcg daily - pt feels good on this dose. - we discussed about taking the thyroid hormone every day, with water, >30 minutes before breakfast, separated by >4 hours from acid reflux medications, calcium, iron, multivitamins. Pt. is taking it correctly. - I  was planning to check her TFTs today, however, she took biotin 2000-2500 mcg in her coffee this am  - will stop this and check thyroid tests in several days: TSH and fT4 - If labs are abnormal, she will need to return for repeat TFTs in 1.5 months  2. GO -Now mild, improved after starting selenium.  She continues 200 mcg daily. -She denies double vision, blurry vision, chemosis, eye pain -She continues to see Dr. Prudencio Perez with ophthalmology   3.  History of Graves' disease -Her latest TSI antibodies were elevated, but much improved: Lab Results  Component Value Date   TSI 44 (H) 10/06/2020  -We will recheck these today  Orders Placed This Encounter  Procedures   TSH   T4, free   Thyroid stimulating immunoglobulin   Component     Latest Ref Rng & Units 10/10/2021  T4,Free(Direct)     0.60 - 1.60 ng/dL 1.14  TSH     0.35 - 5.50 uIU/mL 0.34 (L)  TSI     <140 % baseline 343 (H)   Her Graves' antibodies are still elevated.  The TSH is only very slightly low.  For now, since she is on a low-dose of levothyroxine, I  would suggest to continue the same dose of 75 mcg daily and recheck the test in 2 months.  At that time, I will advise her to stay off biotin for 2 weeks.  Philemon Kingdom, MD PhD [facility]

## 2021-10-10 ENCOUNTER — Other Ambulatory Visit: Payer: Self-pay

## 2021-10-10 ENCOUNTER — Other Ambulatory Visit (INDEPENDENT_AMBULATORY_CARE_PROVIDER_SITE_OTHER): Payer: Medicare HMO

## 2021-10-10 DIAGNOSIS — E890 Postprocedural hypothyroidism: Secondary | ICD-10-CM

## 2021-10-10 DIAGNOSIS — Z8639 Personal history of other endocrine, nutritional and metabolic disease: Secondary | ICD-10-CM | POA: Diagnosis not present

## 2021-10-10 LAB — T4, FREE: Free T4: 1.14 ng/dL (ref 0.60–1.60)

## 2021-10-10 LAB — TSH: TSH: 0.34 u[IU]/mL — ABNORMAL LOW (ref 0.35–5.50)

## 2021-10-11 DIAGNOSIS — M25552 Pain in left hip: Secondary | ICD-10-CM | POA: Diagnosis not present

## 2021-10-11 DIAGNOSIS — M1612 Unilateral primary osteoarthritis, left hip: Secondary | ICD-10-CM | POA: Diagnosis not present

## 2021-10-12 ENCOUNTER — Encounter: Payer: Self-pay | Admitting: Internal Medicine

## 2021-10-12 LAB — THYROID STIMULATING IMMUNOGLOBULIN: TSI: 343 % baseline — ABNORMAL HIGH (ref <140)

## 2021-10-12 NOTE — Addendum Note (Signed)
Addended by: Philemon Kingdom on: 10/12/2021 03:28 PM   Modules accepted: Orders

## 2021-10-24 ENCOUNTER — Other Ambulatory Visit: Payer: Self-pay

## 2021-10-24 ENCOUNTER — Ambulatory Visit (INDEPENDENT_AMBULATORY_CARE_PROVIDER_SITE_OTHER): Payer: Medicare HMO | Admitting: Addiction (Substance Use Disorder)

## 2021-10-24 DIAGNOSIS — F411 Generalized anxiety disorder: Secondary | ICD-10-CM | POA: Diagnosis not present

## 2021-10-24 NOTE — Progress Notes (Signed)
[facility] Counselor Initial Adult Exam  Name: Glenda Perez Date: 10/24/2021 MRN: [id number] DOB: January 10, 1941 PCP: Lajean Manes, MD  Time spent: 50 mins  Reason for Visit /Presenting Problem: Client reported struggling with symptoms like: self critical, anxious, depressed, racing thoughts, scared of change, ongoing grief. Client reported that after her husband died, her way of dealing with it was going into overdrive. Client reported feeling almost "manic" and said to herself, "if I keep my mind busy I wont feel the pain of him being gone". Client reported having a fainting episode 14 years ago at an [employer] event and they found out she had hyperthyroidism. Client takes her meds but she thinks that it still makes her feel anxious. Client lonely and looking for things to do socially too. Client feeling like she could be doing better, both physically and mentally. Client loves to do yoga but is feeling sad she cant do anything active. Client fearful about not doing anything that will take her back and give her more hip pain, but is feeling limited. Client disclosed more about her marriage and her happy life until her husband passed 15 years ago. Client reported things got harder during [place] and got out of her healthy yoga and art making schedule. Client reported that isolation affected her greatly and has increased her depression and anxiety. 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 Client reported other things that inspire her include music and symphony. Therapist built rapport with client & Client participated in the treatment planning of their therapy and wants to listen to more music and paint more. Client agreed with the plan if there is a crisis: contact after hours office line, call [phone] and/or crisis line given by therapist.   Mental Status Exam:    Appearance:   Well Groomed     Behavior:  Appropriate  Motor:   Normal  Speech/Language:   Clear and Coherent and Normal Rate  Affect:  Appropriate and Congruent  Mood:  anxious, depressed, labile, and sad  Thought process:  flight of ideas  Thought content:    Obsessions and Rumination  Sensory/Perceptual disturbances:    WNL  Orientation:  x4  Attention:  Good  Concentration:  Good  Memory:  WNL  Fund of knowledge:   Fair  Insight:    Fair  Judgment:   Fair  Impulse Control:  Good   Reported Symptoms:  self critical, anxious, depressed, racing thoughts, scared of change, ongoing grief.   Risk Assessment: Danger to Self:  No Self-injurious Behavior: No Danger to Others: No Duty to Warn:no Physical Aggression / Violence:No  Access to Firearms a concern: No  Gang Involvement:No  Patient / guardian was educated about steps to take if suicide or homicide risk level increases between visits: yes While future psychiatric events cannot be accurately predicted, the patient does not currently require acute inpatient psychiatric care and does not currently meet [facility] involuntary commitment criteria.  Substance Abuse History: Current substance abuse: No     Past Psychiatric History:   No previous psychological problems have been observed Outpatient Providers:Stoneking, Hal, MD History of Psych Hospitalization: No   Abuse History: Victim of Yes.  , emotional - from her 1st husband who was depressed and borderline/manipulative and isolating. Report needed: No. Victim of Neglect:No. Perpetrator of  n/a   Witness / Exposure to Domestic Violence: No   Protective Services Involvement: No  Witness to [employer]  Violence:  No   Family History:  Family History  Problem Relation Age of Onset   Colon cancer Neg Hx    Esophageal cancer Neg Hx    Pancreatic cancer Neg Hx    Rectal cancer Neg Hx    Stomach cancer Neg Hx     Living situation: the patient lives alone  Sexual Orientation:  Straight  Relationship Status: widowed  Name of  spouse / other: Glenda Perez             If a parent, number of children / ages: [name]- grown with 2 kids of her own.  Support Systems; friends & daughter  Financial Stress:  Yes   Income/Employment/Disability: retired  Armed forces logistics/support/administrative officer: No   Educational History: Education: college graduate- [employer]  Religion/Sprituality/World View:   Jewish  Any cultural differences that may affect / interfere with treatment:  not applicable   Recreation/Hobbies: mixed media art, yoga- wants to go back to it, travel.   Stressors:Health problems   Loss of her husband 15 years ago   Marital or family conflict    Strengths:  Art Friends, Conservator, museum/gallery, and Able to Communicate Effectively  Legal History: Pending legal issue / charges: The patient has no significant history of legal issues.  Medical History/Surgical History:reviewed Past Medical History:  Diagnosis Date   Anxiety    clonezapam   Osteopenia    Thyroid disease     Past Surgical History:  Procedure Laterality Date   BIOPSY BREAST     COLONOSCOPY     OOPHORECTOMY     UPPER GASTROINTESTINAL ENDOSCOPY      Medications: Current Outpatient Medications  Medication Sig Dispense Refill   busPIRone (BUSPAR) 5 MG tablet Take 5 mg by mouth at bedtime. (Patient not taking: Reported on 07/03/2021)     cholecalciferol (VITAMIN D) 1000 UNITS tablet Take 1,000 Units by mouth daily.     clonazePAM (KLONOPIN) 0.5 MG tablet Take 0.5 mg by mouth at bedtime.     fish oil-omega-3 fatty acids 1000 MG capsule Take 1 g by mouth daily.     gabapentin (NEURONTIN) 300 MG capsule Take 1 capsule (300 mg total) by mouth at bedtime. (Patient not taking: Reported on 07/03/2021) 30 capsule 0   levothyroxine (SYNTHROID) 75 MCG tablet TAKE 1 TABLET EVERY DAY 90 tablet 3   predniSONE (DELTASONE) 20 MG tablet Take 1 tablet (20 mg total) by mouth 2 (two) times daily. (Patient not taking: Reported on 07/03/2021) 14 tablet 0   Selenium 100 MCG CAPS Take 200 mcg by  mouth daily.      TURMERIC PO Take 200 mg by mouth daily.      No current facility-administered medications for this visit.    Allergies  Allergen Reactions   Amoxicillin Rash    Has patient had a PCN reaction causing immediate rash, facial/tongue/throat swelling, SOB or lightheadedness with hypotension:No Has patient had a PCN reaction causing severe rash involving mucus membranes or skin necrosis:No Has patient had a PCN reaction that required hospitalization:No Has patient had a PCN reaction occurring within the last 10 years:Yes If all of the above answers are "NO", then may proceed with Cephalosporin use.    Erythromycin    Ibuprofen    Other Rash    All cillins Other reaction(s): tingling in fingers   Diagnoses:    ICD-10-CM   1. 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  Client to engage in positive self talk and challenging negative internal ruminations and self talk causing client to be overly anxious and worried using CBT, on daily practice. Client to engage in mindfulness: ie body scans each eveneing to help process and discharge emotional distress & recognize emotions. Client to utilize BSP (brainspotting) with therapist to help client regulate their anxiety in a somatic- felt body sense way: (ie by working to reduce muscle tension, ruminations, increased heart rate, constant worrying and feeling "hyper" ) by decreasing anxiety by 33% in the next 6 months.  Client to understand how to succeed in fighting back worry and embrace life's uncertainty.  Barnie Del, LCSW, LCAS, CCTP, CCS

## 2021-10-25 ENCOUNTER — Encounter: Payer: Self-pay | Admitting: Sports Medicine

## 2021-11-01 DIAGNOSIS — M7062 Trochanteric bursitis, left hip: Secondary | ICD-10-CM | POA: Diagnosis not present

## 2021-11-01 DIAGNOSIS — M25552 Pain in left hip: Secondary | ICD-10-CM | POA: Diagnosis not present

## 2021-11-13 DIAGNOSIS — I351 Nonrheumatic aortic (valve) insufficiency: Secondary | ICD-10-CM | POA: Diagnosis not present

## 2021-11-13 DIAGNOSIS — E039 Hypothyroidism, unspecified: Secondary | ICD-10-CM | POA: Diagnosis not present

## 2021-11-13 DIAGNOSIS — E7800 Pure hypercholesterolemia, unspecified: Secondary | ICD-10-CM | POA: Diagnosis not present

## 2021-11-13 DIAGNOSIS — I700 Atherosclerosis of aorta: Secondary | ICD-10-CM | POA: Diagnosis not present

## 2021-11-15 DIAGNOSIS — Z1231 Encounter for screening mammogram for malignant neoplasm of breast: Secondary | ICD-10-CM | POA: Diagnosis not present

## 2021-11-16 DIAGNOSIS — M545 Low back pain, unspecified: Secondary | ICD-10-CM | POA: Diagnosis not present

## 2021-11-21 ENCOUNTER — Other Ambulatory Visit: Payer: Self-pay

## 2021-11-21 ENCOUNTER — Ambulatory Visit (INDEPENDENT_AMBULATORY_CARE_PROVIDER_SITE_OTHER): Payer: Medicare HMO | Admitting: Sports Medicine

## 2021-11-21 DIAGNOSIS — M7632 Iliotibial band syndrome, left leg: Secondary | ICD-10-CM

## 2021-11-21 NOTE — Patient Instructions (Signed)
It was great to see you today!  We discussed the following: -Continue your hip strengthening exercises (clam shells and lateral leg lifts) Add side steps and crossover steps that we gave you today. -Try foam rolling over your IT band. -Try voltaren gel up to 4 times a day as needed over that hip.

## 2021-11-22 ENCOUNTER — Encounter: Payer: Self-pay | Admitting: Sports Medicine

## 2021-11-22 DIAGNOSIS — M7632 Iliotibial band syndrome, left leg: Secondary | ICD-10-CM | POA: Insufficient documentation

## 2021-11-22 NOTE — Assessment & Plan Note (Signed)
4 abduction exercises to continue with focus on left Consider foam roller on left ITB Referral to PT/[place] to see if an ongoing program would help deal with pain and strength  I did not suggest a correction of leg length since gait seems pretty neutral  PRN follow up

## 2021-11-22 NOTE — Progress Notes (Signed)
CC: Left lateral leg pain over the ITB  Patient seen by Dr. Rolena Infante and Rhona Raider recently Left hip weakness and ITB area pain Has been going to PT and doing some HEP to strengthen left hip  She has scoliosis and some inequality of leg length Lift was tried on left but was not comfortable to use  Comes for gait assessment and suggestions about lateral leg pain  PE Pleasant older F in NAD BP 104/70    Ht 5' 1.5" (1.562 m)    Wt 105 lb (47.6 kg)    BMI 19.52 kg/m  [place] Adult Exercise 01/16/2021  Frequency of aerobic exercise (# of days/week) 5  Average time in minutes 30  Frequency of strengthening activities (# of days/week) 1   Hip ROM is normal overall with some tightness and limit on IR that is bilateral Full flexion Strength testing shows good overall strength even on left abduction today Left slightly weaker than RT  Back - well corrected thoraco-lumbar scoliosis with good overall posture Her left hemipelvis sits slightly higher on standing and has slight forward rotation Leg lengths are unequal by about 1 cm lying No trendelenburg noted on walking gait  TTP along mid left ITB

## 2021-12-07 IMAGING — MR MR HEAD WO/W CM
12 series · 48 of 48 positions shown · IV contrast (multihance)
Comparison: None.

CLINICAL DATA: Left-sided headache for 6 weeks.

EXAM:
MRI HEAD WITHOUT AND WITH CONTRAST
TECHNIQUE: Multiplanar, multiecho pulse sequences of the brain and surrounding
structures were obtained without and with intravenous contrast.
CONTRAST:  9mL MULTIHANCE GADOBENATE DIMEGLUMINE 529 MG/ML IV SOLN

[Series 2: T1 · sagittal · 5.0mm · 0.45mm/px · 2 of 23 slices shown]
[im 1/23]
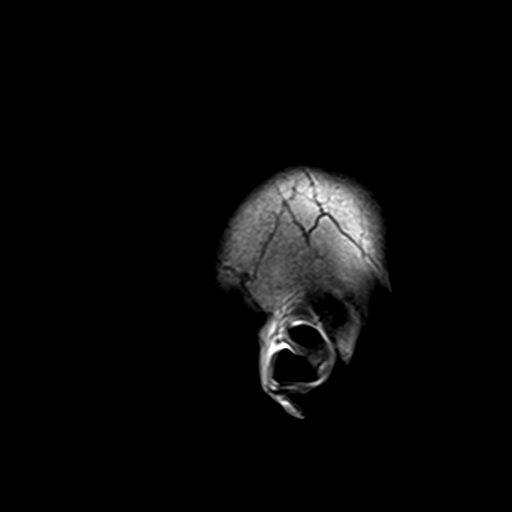
[im 23/23]
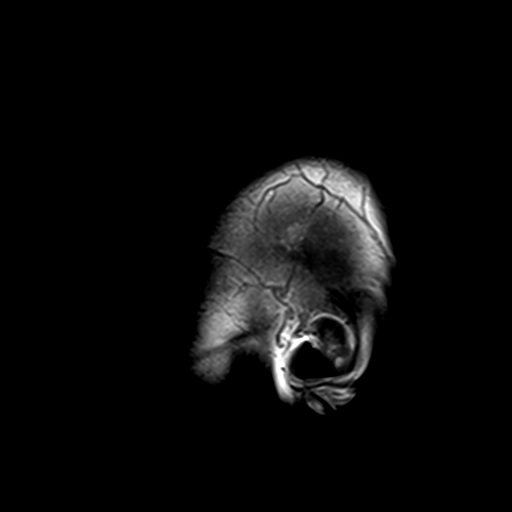

[Series 3: DWI · axial · 3.0mm · 1.80mm/px · z∈[-75,+71]mm · 7 of 100 slices shown (1 of 4)]
[im 1/100]
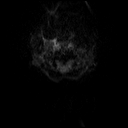
[im 17/100]
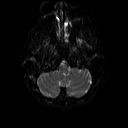
[im 34/100]
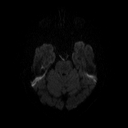
[im 50/100]
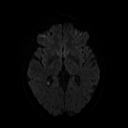
[im 67/100]
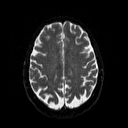
[im 83/100]
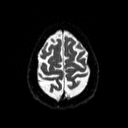
[im 100/100]
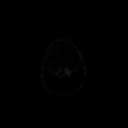

[Series 4: DWI · axial · 3.0mm · 1.80mm/px · z∈[-75,+71]mm · 3 of 46 slices shown (2 of 4)]
[im 1/46]
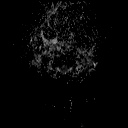
[im 23/46]
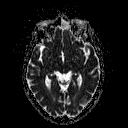
[im 46/46]
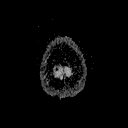

[Series 5: DWI · coronal · 5.0mm · 1.80mm/px · 5 of 71 slices shown (3 of 4)]
[im 1/71]
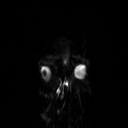
[im 18/71]
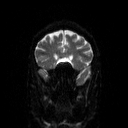
[im 36/71]
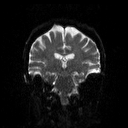
[im 53/71]
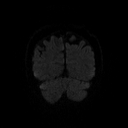
[im 71/71]
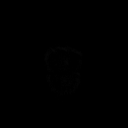

[Series 6: DWI · coronal · 5.0mm · 1.80mm/px · 2 of 36 slices shown (4 of 4)]
[im 1/36]
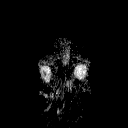
[im 36/36]
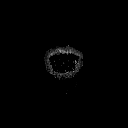

[Series 7: T2 · axial · 5.0mm · 0.60mm/px · 1 of 22 slices shown (1 of 2)]
[im 1/22]
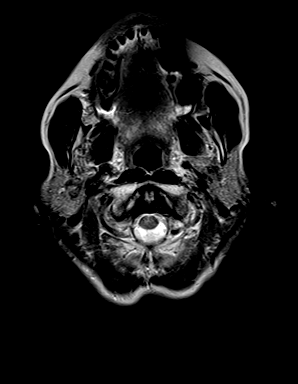

[Series 8: FLAIR · axial · 3.0mm · 0.45mm/px · z∈[-69,+65]mm · 2 of 30 slices shown]
[im 1/30]
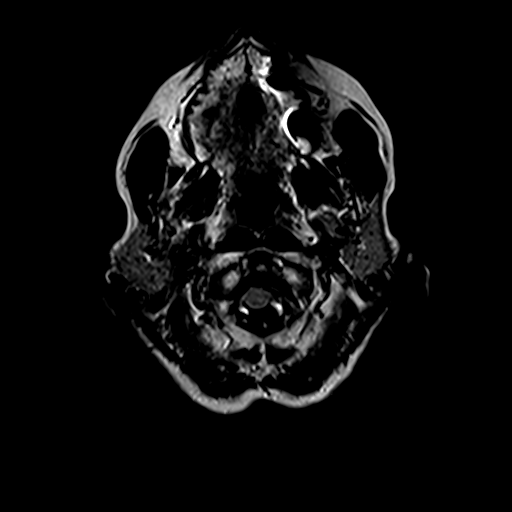
[im 30/30]
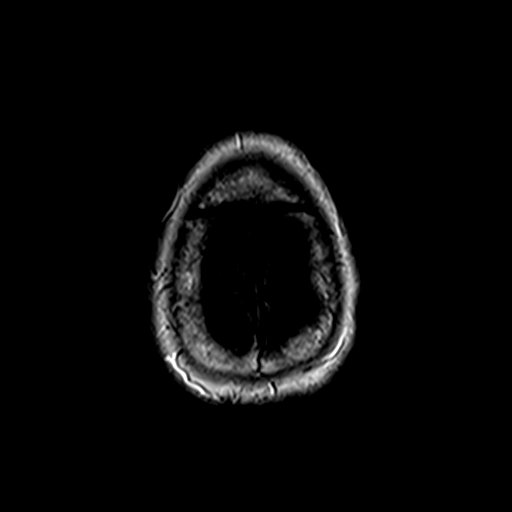

[Series 10: swi_images · axial · 4.0mm · 0.90mm/px · z∈[-71,+68]mm · 2 of 36 slices shown]
[im 1/36]
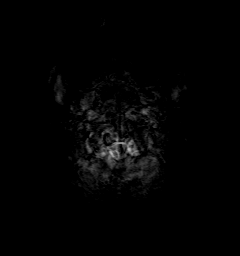
[im 36/36]
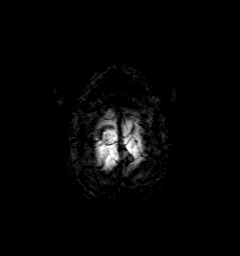

[Series 11: t1_mpr_tra · axial · 1.0mm · 0.75mm/px · z∈[-69,+74]mm · 10 of 144 slices shown]
[im 1/144]
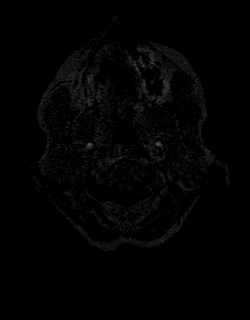
[im 16/144]
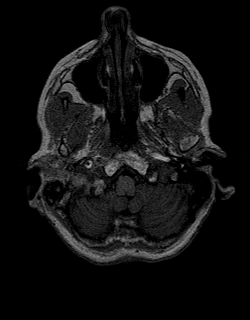
[im 32/144]
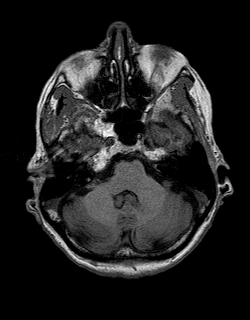
[im 48/144]
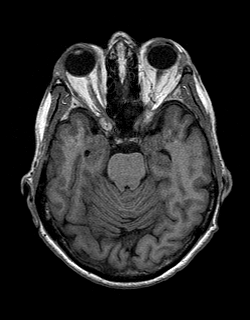
[im 64/144]
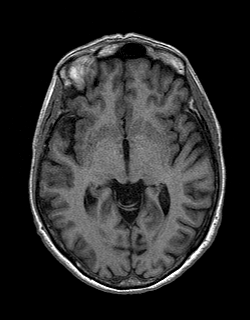
[im 80/144]
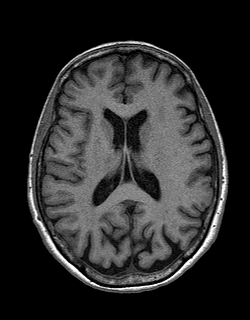
[im 96/144]
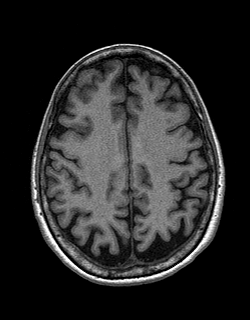
[im 112/144]
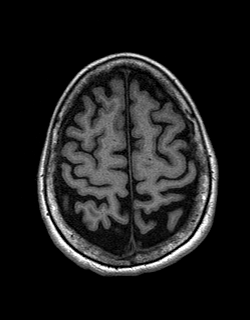
[im 128/144]
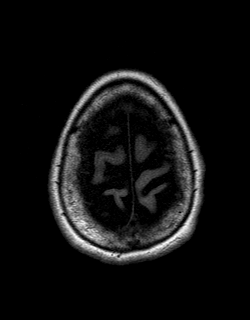
[im 144/144]
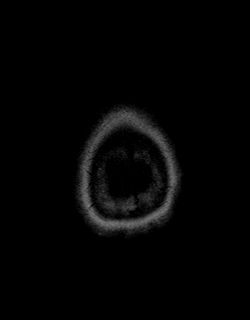

[Series 12: T2 · coronal · 5.0mm · 0.45mm/px · 2 of 28 slices shown (2 of 2)]
[im 1/28]
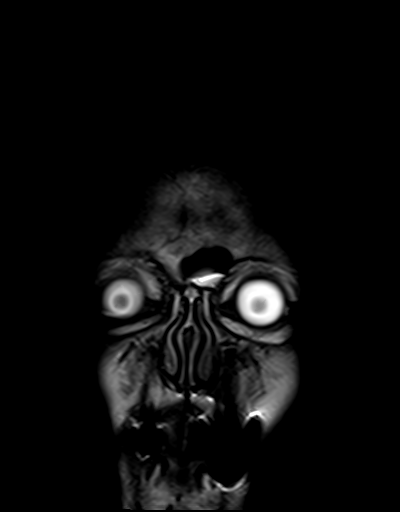
[im 28/28]
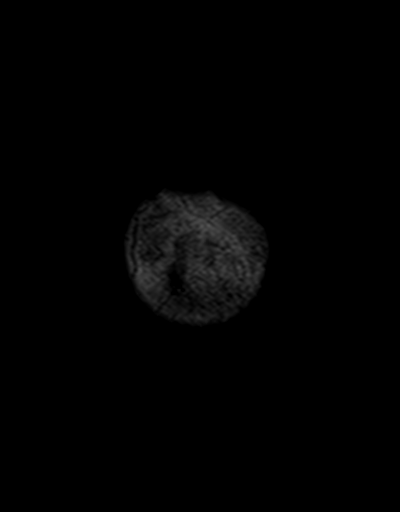

[Series 13: t1_mpr_tra post · axial · 1.0mm · 0.75mm/px · z∈[-69,+74]mm · 10 of 144 slices shown]
[im 1/144]
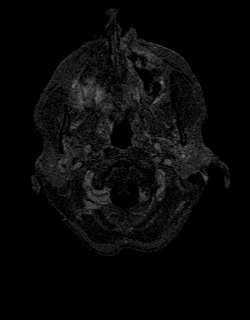
[im 16/144]
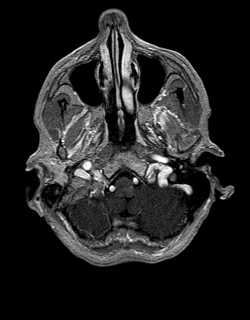
[im 32/144]
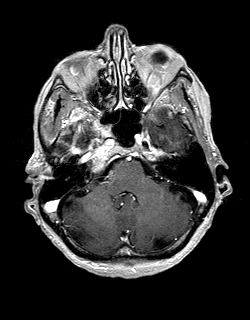
[im 48/144]
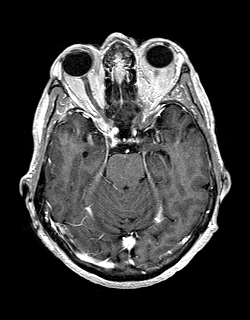
[im 64/144]
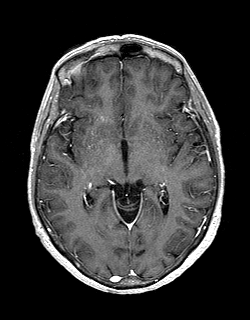
[im 80/144]
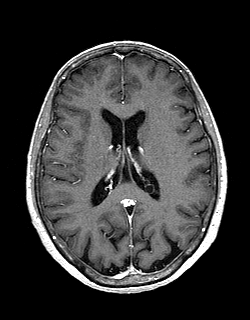
[im 96/144]
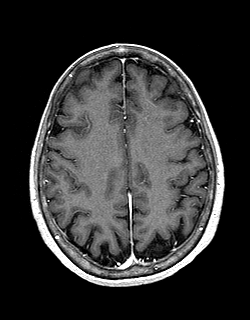
[im 112/144]
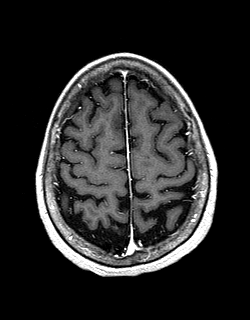
[im 128/144]
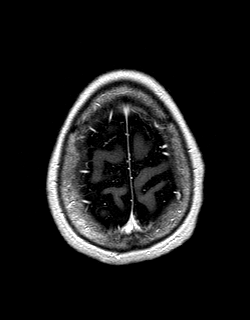
[im 144/144]
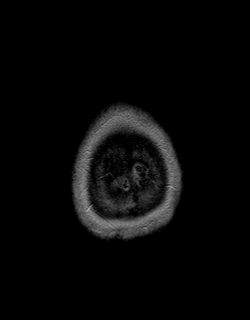

[Series 14: post cor · coronal · 5.0mm · 0.45mm/px · 2 of 28 slices shown]
[im 1/28]
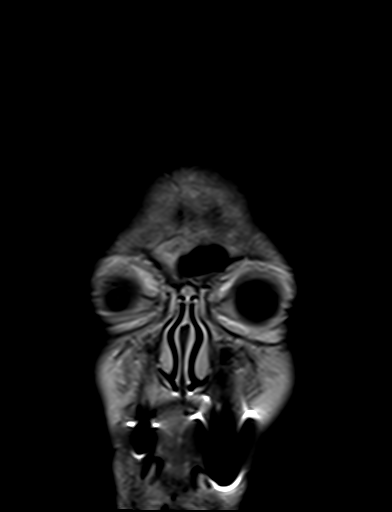
[im 28/28]
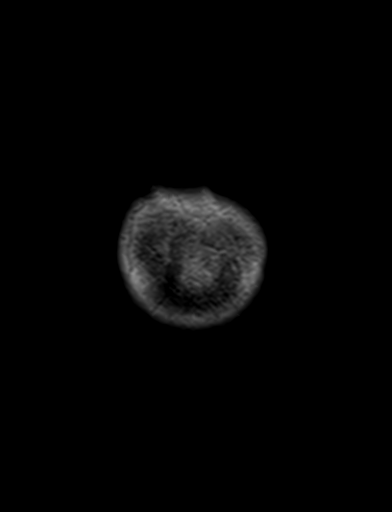

[48 of 48 positions shown; findings below may reference images not displayed]

FINDINGS: Brain: No acute or subacute infarction, hemorrhage, hydrocephalus,
extra-axial collection or mass lesion. Overall age normal brain
volume but there may be preferential cortical thinning in the
biparietal region. Normal white matter signal. No abnormal
intracranial enhancement

Vascular: Normal flow voids.

Skull and upper cervical spine: Normal marrow signal.

Sinuses/Orbits: Partially opacified left frontal sinus. Negative
orbits
IMPRESSION: 1. No intracranial cause for headache.
2. Partially opacified left frontal sinus which could relate to
left-sided headache.

## 2021-12-11 ENCOUNTER — Other Ambulatory Visit: Payer: Medicare HMO

## 2021-12-13 ENCOUNTER — Other Ambulatory Visit: Payer: Self-pay

## 2021-12-13 ENCOUNTER — Other Ambulatory Visit (INDEPENDENT_AMBULATORY_CARE_PROVIDER_SITE_OTHER): Payer: Medicare HMO

## 2021-12-13 ENCOUNTER — Encounter: Payer: Self-pay | Admitting: Internal Medicine

## 2021-12-13 DIAGNOSIS — E890 Postprocedural hypothyroidism: Secondary | ICD-10-CM | POA: Diagnosis not present

## 2021-12-13 LAB — T4, FREE: Free T4: 1.38 ng/dL (ref 0.60–1.60)

## 2021-12-13 LAB — TSH: TSH: 0.3 u[IU]/mL — ABNORMAL LOW (ref 0.35–5.50)

## 2021-12-14 ENCOUNTER — Other Ambulatory Visit: Payer: Self-pay | Admitting: Internal Medicine

## 2021-12-14 DIAGNOSIS — E890 Postprocedural hypothyroidism: Secondary | ICD-10-CM

## 2021-12-14 DIAGNOSIS — E0500 Thyrotoxicosis with diffuse goiter without thyrotoxic crisis or storm: Secondary | ICD-10-CM

## 2021-12-14 MED ORDER — LEVOTHYROXINE SODIUM 75 MCG PO TABS: 75.0000 ug | ORAL_TABLET | ORAL | 3 refills | Status: DC

## 2021-12-14 MED ORDER — LEVOTHYROXINE SODIUM 50 MCG PO TABS: 50.0000 ug | ORAL_TABLET | ORAL | 3 refills | Status: DC

## 2021-12-15 MED ORDER — LEVOTHYROXINE SODIUM 50 MCG PO TABS: 50.0000 ug | ORAL_TABLET | ORAL | 1 refills | Status: DC

## 2021-12-15 MED ORDER — LEVOTHYROXINE SODIUM 75 MCG PO TABS: 75.0000 ug | ORAL_TABLET | ORAL | 1 refills | Status: DC

## 2021-12-15 NOTE — Addendum Note (Signed)
Addended by: Lauralyn Primes on: 12/15/2021 11:45 AM   Modules accepted: Orders

## 2021-12-25 DIAGNOSIS — F411 Generalized anxiety disorder: Secondary | ICD-10-CM | POA: Diagnosis not present

## 2021-12-25 DIAGNOSIS — F4321 Adjustment disorder with depressed mood: Secondary | ICD-10-CM | POA: Diagnosis not present

## 2022-01-01 DIAGNOSIS — H5203 Hypermetropia, bilateral: Secondary | ICD-10-CM | POA: Diagnosis not present

## 2022-01-01 DIAGNOSIS — H2513 Age-related nuclear cataract, bilateral: Secondary | ICD-10-CM | POA: Diagnosis not present

## 2022-01-01 DIAGNOSIS — H524 Presbyopia: Secondary | ICD-10-CM | POA: Diagnosis not present

## 2022-01-08 DIAGNOSIS — F411 Generalized anxiety disorder: Secondary | ICD-10-CM | POA: Diagnosis not present

## 2022-01-08 DIAGNOSIS — F4321 Adjustment disorder with depressed mood: Secondary | ICD-10-CM | POA: Diagnosis not present

## 2022-01-15 ENCOUNTER — Encounter: Payer: Self-pay | Admitting: Sports Medicine

## 2022-01-17 ENCOUNTER — Ambulatory Visit: Payer: Medicare HMO | Admitting: Addiction (Substance Use Disorder)

## 2022-01-29 DIAGNOSIS — F411 Generalized anxiety disorder: Secondary | ICD-10-CM | POA: Diagnosis not present

## 2022-01-29 DIAGNOSIS — F4321 Adjustment disorder with depressed mood: Secondary | ICD-10-CM | POA: Diagnosis not present

## 2022-01-31 ENCOUNTER — Ambulatory Visit: Payer: Medicare HMO | Admitting: Addiction (Substance Use Disorder)

## 2022-02-06 DIAGNOSIS — F411 Generalized anxiety disorder: Secondary | ICD-10-CM | POA: Diagnosis not present

## 2022-02-06 DIAGNOSIS — F4321 Adjustment disorder with depressed mood: Secondary | ICD-10-CM | POA: Diagnosis not present

## 2022-02-07 ENCOUNTER — Encounter: Payer: Self-pay | Admitting: Internal Medicine

## 2022-02-07 ENCOUNTER — Other Ambulatory Visit (INDEPENDENT_AMBULATORY_CARE_PROVIDER_SITE_OTHER): Payer: Medicare HMO

## 2022-02-07 DIAGNOSIS — E890 Postprocedural hypothyroidism: Secondary | ICD-10-CM | POA: Diagnosis not present

## 2022-02-07 LAB — T4, FREE: Free T4: 0.85 ng/dL (ref 0.60–1.60)

## 2022-02-07 LAB — TSH: TSH: 5.64 u[IU]/mL — ABNORMAL HIGH (ref 0.35–5.50)

## 2022-02-08 ENCOUNTER — Other Ambulatory Visit: Payer: Self-pay | Admitting: Internal Medicine

## 2022-02-08 DIAGNOSIS — E890 Postprocedural hypothyroidism: Secondary | ICD-10-CM

## 2022-02-08 MED ORDER — LEVOTHYROXINE SODIUM 75 MCG PO TABS: ORAL_TABLET | ORAL | 5 refills | Status: DC

## 2022-02-08 MED ORDER — LEVOTHYROXINE SODIUM 50 MCG PO TABS: ORAL_TABLET | ORAL | 5 refills | Status: DC

## 2022-02-08 NOTE — Telephone Encounter (Signed)
Patient called to schedulbae, was scheduled for 03/22/22.  Patient also advises she does not need refills but that when she does the 50 MCG of Levothyroxine should go to the [facility] on [address] and the 75 MCG needs to go to [facility] ?

## 2022-02-13 DIAGNOSIS — F4321 Adjustment disorder with depressed mood: Secondary | ICD-10-CM | POA: Diagnosis not present

## 2022-02-13 DIAGNOSIS — F411 Generalized anxiety disorder: Secondary | ICD-10-CM | POA: Diagnosis not present

## 2022-02-14 ENCOUNTER — Ambulatory Visit: Payer: Medicare HMO | Admitting: Addiction (Substance Use Disorder)

## 2022-02-20 DIAGNOSIS — F411 Generalized anxiety disorder: Secondary | ICD-10-CM | POA: Diagnosis not present

## 2022-02-20 DIAGNOSIS — F4321 Adjustment disorder with depressed mood: Secondary | ICD-10-CM | POA: Diagnosis not present

## 2022-02-27 DIAGNOSIS — F411 Generalized anxiety disorder: Secondary | ICD-10-CM | POA: Diagnosis not present

## 2022-02-27 DIAGNOSIS — F4321 Adjustment disorder with depressed mood: Secondary | ICD-10-CM | POA: Diagnosis not present

## 2022-02-28 ENCOUNTER — Ambulatory Visit: Payer: Medicare HMO | Admitting: Addiction (Substance Use Disorder)

## 2022-03-07 DIAGNOSIS — F411 Generalized anxiety disorder: Secondary | ICD-10-CM | POA: Diagnosis not present

## 2022-03-07 DIAGNOSIS — F4321 Adjustment disorder with depressed mood: Secondary | ICD-10-CM | POA: Diagnosis not present

## 2022-03-14 ENCOUNTER — Ambulatory Visit: Payer: Medicare HMO | Admitting: Addiction (Substance Use Disorder)

## 2022-03-22 ENCOUNTER — Encounter: Payer: Self-pay | Admitting: Internal Medicine

## 2022-03-22 ENCOUNTER — Other Ambulatory Visit (INDEPENDENT_AMBULATORY_CARE_PROVIDER_SITE_OTHER): Payer: Medicare HMO

## 2022-03-22 DIAGNOSIS — E890 Postprocedural hypothyroidism: Secondary | ICD-10-CM

## 2022-03-22 LAB — TSH: TSH: 1.28 u[IU]/mL (ref 0.35–5.50)

## 2022-03-22 LAB — T4, FREE: Free T4: 1.06 ng/dL (ref 0.60–1.60)

## 2022-03-26 ENCOUNTER — Other Ambulatory Visit: Payer: Self-pay | Admitting: Internal Medicine

## 2022-03-26 DIAGNOSIS — E890 Postprocedural hypothyroidism: Secondary | ICD-10-CM

## 2022-03-26 MED ORDER — LEVOTHYROXINE SODIUM 50 MCG PO TABS: ORAL_TABLET | ORAL | 3 refills | Status: DC

## 2022-03-27 DIAGNOSIS — F411 Generalized anxiety disorder: Secondary | ICD-10-CM | POA: Diagnosis not present

## 2022-04-10 DIAGNOSIS — F411 Generalized anxiety disorder: Secondary | ICD-10-CM | POA: Diagnosis not present

## 2022-04-16 ENCOUNTER — Ambulatory Visit: Payer: Medicare HMO | Admitting: Dermatology

## 2022-04-16 DIAGNOSIS — L821 Other seborrheic keratosis: Secondary | ICD-10-CM

## 2022-04-16 DIAGNOSIS — L720 Epidermal cyst: Secondary | ICD-10-CM

## 2022-04-16 DIAGNOSIS — L738 Other specified follicular disorders: Secondary | ICD-10-CM | POA: Diagnosis not present

## 2022-04-16 DIAGNOSIS — D1801 Hemangioma of skin and subcutaneous tissue: Secondary | ICD-10-CM | POA: Diagnosis not present

## 2022-04-16 DIAGNOSIS — Z1283 Encounter for screening for malignant neoplasm of skin: Secondary | ICD-10-CM

## 2022-04-18 ENCOUNTER — Other Ambulatory Visit (HOSPITAL_BASED_OUTPATIENT_CLINIC_OR_DEPARTMENT_OTHER): Payer: Self-pay

## 2022-04-23 ENCOUNTER — Other Ambulatory Visit (HOSPITAL_BASED_OUTPATIENT_CLINIC_OR_DEPARTMENT_OTHER): Payer: Self-pay

## 2022-04-30 DIAGNOSIS — F411 Generalized anxiety disorder: Secondary | ICD-10-CM | POA: Diagnosis not present

## 2022-05-07 ENCOUNTER — Ambulatory Visit (INDEPENDENT_AMBULATORY_CARE_PROVIDER_SITE_OTHER): Payer: Medicare HMO | Admitting: Family Medicine

## 2022-05-07 VITALS — BP 126/72 | Ht 62.0 in | Wt 105.0 lb

## 2022-05-07 DIAGNOSIS — M25552 Pain in left hip: Secondary | ICD-10-CM | POA: Diagnosis not present

## 2022-05-07 DIAGNOSIS — M7632 Iliotibial band syndrome, left leg: Secondary | ICD-10-CM

## 2022-05-07 MED ORDER — DICLOFENAC SODIUM 1 % EX GEL: 2.0000 g | Freq: Four times a day (QID) | CUTANEOUS | 0 refills | Status: DC | PRN

## 2022-05-07 NOTE — Patient Instructions (Signed)
Do exercises and stretches with focus on the hip external rotators and IT band. Go to the gym twice a week. Try to avoid stairs, hills when you can. Voltaren gel up to 4 times a day topically. Ok to take tylenol for pain if needed. Heat or ice - whichever feels better for 15 minutes at a time as needed though I would ice after exercise. Follow up with [place] in about 5-6 weeks.

## 2022-05-08 ENCOUNTER — Encounter: Payer: Self-pay | Admitting: Family Medicine

## 2022-05-08 NOTE — Progress Notes (Signed)
PCP: Lajean Manes, MD  Subjective:   HPI: Patient is a 81 y.o. female here for left thigh, gluteal pain.  Patient reports for the past several months She had IT band syndrome, hip weakness. She did physical therapy and pilates. Recently joined [employer] and working out there, walking on track but wants to know if she should be doing these things and how best to get better. Reports some occasional left foot tingling but nothing consistent. No back pain currently.  Past Medical History:  Diagnosis Date   Anxiety    clonezapam   Osteopenia    Thyroid disease     Current Outpatient Medications on File Prior to Visit  Medication Sig Dispense Refill   clonazePAM (KLONOPIN) 0.5 MG tablet Take 0.5 mg by mouth at bedtime.     fish oil-omega-3 fatty acids 1000 MG capsule Take 1 g by mouth daily.     levothyroxine (SYNTHROID) 50 MCG tablet Take 1 tablet by mouth 2 days a week 30 tablet 3   levothyroxine (SYNTHROID) 75 MCG tablet Take 1 tablet by mouth 5 days a week 40 tablet 5   Selenium 100 MCG CAPS Take 200 mcg by mouth daily.      TURMERIC PO Take 200 mg by mouth daily.      No current facility-administered medications on file prior to visit.    Past Surgical History:  Procedure Laterality Date   BIOPSY BREAST     COLONOSCOPY     OOPHORECTOMY     UPPER GASTROINTESTINAL ENDOSCOPY      Allergies  Allergen Reactions   Amoxicillin Rash    Has patient had a PCN reaction causing immediate rash, facial/tongue/throat swelling, SOB or lightheadedness with hypotension:No Has patient had a PCN reaction causing severe rash involving mucus membranes or skin necrosis:No Has patient had a PCN reaction that required hospitalization:No Has patient had a PCN reaction occurring within the last 10 years:Yes If all of the above answers are "NO", then may proceed with Cephalosporin use.    Clavulanic Acid    Erythromycin    Ibuprofen    Other Rash    All cillins Other reaction(s): tingling  in fingers    BP 126/72   Ht '5\' 2"'$  (1.575 m)   Wt 105 lb (47.6 kg)   BMI 19.20 kg/m      01/16/2021   11:38 AM  [facility] Exercise  Frequency of aerobic exercise (# of days/week) 5  Average time in minutes 30  Frequency of strengthening activities (# of days/week) 1        No data to display              Objective:  Physical Exam:  Gen: NAD, comfortable in exam room  Back: No gross deformity, scoliosis. No paraspinal or SI joint TTP.  No midline or bony TTP. Strength LEs 5/5 all muscle groups including hip abduction.   2+ MSRs in patellar and achilles tendons, equal bilaterally. Negative SLRs. Sensation intact to light touch bilaterally.  Left hip: No deformity. Mild limitation IR with 5/5 strength. Tenderness to palpation over greater trochanter. NVI distally. Negative logroll Negative faber, fadir, and piriformis stretches. Positive ober's.   Assessment & Plan:  1. Left hip pain - overall exam is reassuring.  She has improved her strength of her hip.  She's getting some occasional tingling into left foot but likely due to sciatic nerve impingement when sitting.  Back exam reassuring.  Reviewed home exercises and stretches for  IT band and hip external rotators.  Try to avoid stairs and hills when possible.  Gym for resistance training twice a week.  Tylenol, voltaren gel if needed.  Heat or ice.  F/u in 5-6 weeks.

## 2022-05-10 DIAGNOSIS — F411 Generalized anxiety disorder: Secondary | ICD-10-CM | POA: Diagnosis not present

## 2022-05-13 ENCOUNTER — Encounter: Payer: Self-pay | Admitting: Dermatology

## 2022-05-13 NOTE — Progress Notes (Signed)
   Follow-Up Visit   Subjective  Glenda Perez is a 81 y.o. female who presents for the following: Annual Exam (Here for annual skin exam. Concerns lesion on left ear and under the right eye. ).  Annual skin examination, several spots to check Location:  Duration:  Quality:  Associated Signs/Symptoms: Modifying Factors:  Severity:  Timing: Context:   Objective  Well appearing patient in no apparent distress; mood and affect are within normal limits. Full body skin exam. No atypical moles this is all checked with dermoscopy) or non mole skin cancers.  Head - Anterior (Face), Mid Back Multiple brown textured 3 to 12 mm flattopped papules  Right Upper Back Several 1 mm smooth red dermal papules  Head - Anterior (Face) Half dozen 2 mm flesh-colored papules with eccentric dell  Right Lower Eyelid Half millimeter white dermal papule    A full examination was performed including scalp, head, eyes, ears, nose, lips, neck, chest, axillae, abdomen, back, buttocks, bilateral upper extremities, bilateral lower extremities, hands, feet, fingers, toes, fingernails, and toenails. All findings within normal limits unless otherwise noted below.  Areas beneath undergarments not fully examined   Assessment & Plan    Screening for malignant neoplasm of skin  Yearly skin exams.  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  The documentation on 05/13/22 for the exam, diagnosis, procedures, and orders are all accurate and complete.

## 2022-05-15 ENCOUNTER — Ambulatory Visit: Payer: Medicare HMO | Admitting: Sports Medicine

## 2022-05-17 DIAGNOSIS — F411 Generalized anxiety disorder: Secondary | ICD-10-CM | POA: Diagnosis not present

## 2022-05-19 ENCOUNTER — Encounter: Payer: Self-pay | Admitting: Family Medicine

## 2022-05-20 ENCOUNTER — Encounter (HOSPITAL_BASED_OUTPATIENT_CLINIC_OR_DEPARTMENT_OTHER): Payer: Self-pay

## 2022-05-20 DIAGNOSIS — R109 Unspecified abdominal pain: Secondary | ICD-10-CM | POA: Insufficient documentation

## 2022-05-20 DIAGNOSIS — R197 Diarrhea, unspecified: Secondary | ICD-10-CM | POA: Insufficient documentation

## 2022-05-20 LAB — CBC
HCT: 40.9 % (ref 36.0–46.0)
Hemoglobin: 14.1 g/dL (ref 12.0–15.0)
MCH: 32.9 pg (ref 26.0–34.0)
MCHC: 34.5 g/dL (ref 30.0–36.0)
MCV: 95.6 fL (ref 80.0–100.0)
Platelets: 238 10*3/uL (ref 150–400)
RBC: 4.28 MIL/uL (ref 3.87–5.11)
RDW: 12.7 % (ref 11.5–15.5)
WBC: 7.7 10*3/uL (ref 4.0–10.5)
nRBC: 0 % (ref 0.0–0.2)

## 2022-05-20 LAB — COMPREHENSIVE METABOLIC PANEL
ALT: 10 U/L (ref 0–44)
AST: 16 U/L (ref 15–41)
Albumin: 4.9 g/dL (ref 3.5–5.0)
Alkaline Phosphatase: 65 U/L (ref 38–126)
Anion gap: 11 (ref 5–15)
BUN: 16 mg/dL (ref 8–23)
CO2: 26 mmol/L (ref 22–32)
Calcium: 9.5 mg/dL (ref 8.9–10.3)
Chloride: 98 mmol/L (ref 98–111)
Creatinine, Ser: 0.59 mg/dL (ref 0.44–1.00)
GFR, Estimated: >60 mL/min (ref >60)
Glucose, Bld: 104 mg/dL — ABNORMAL HIGH (ref 70–99)
Potassium: 3.8 mmol/L (ref 3.5–5.1)
Sodium: 135 mmol/L (ref 135–145)
Total Bilirubin: 0.5 mg/dL (ref 0.3–1.2)
Total Protein: 7.8 g/dL (ref 6.5–8.1)

## 2022-05-20 NOTE — ED Triage Notes (Signed)
Pt presents to the ED POV with friend from home. States that she was at dinner today when she got really bad abdominal pain and went to the bathroom and had blood in her stool. Reports bright red blood. States that she had two episodes of bloody stool. Reports nausea. Denies emesis. No abdominal pain at this time

## 2022-05-21 ENCOUNTER — Emergency Department (HOSPITAL_BASED_OUTPATIENT_CLINIC_OR_DEPARTMENT_OTHER)
Admission: EM | Admit: 2022-05-21 | Discharge: 2022-05-21 | Disposition: A | Discharge status: Home / Self Care | Payer: Medicare HMO | Attending: Emergency Medicine | Admitting: Emergency Medicine

## 2022-05-21 DIAGNOSIS — R197 Diarrhea, unspecified: Secondary | ICD-10-CM

## 2022-05-21 LAB — OCCULT BLOOD X 1 CARD TO LAB, STOOL: Fecal Occult Bld: NEGATIVE

## 2022-05-21 NOTE — ED Provider Notes (Signed)
[facility] EMERGENCY DEPT  Provider Note  CSN: [id number] Arrival date & time: 05/20/22 2247  History Chief Complaint  Patient presents with   Abdominal Pain    Glenda Perez is a 81 y.o. female with no significant PMH reports she went to a movie and the to dinner this evening. Began having some abdominal discomfort on the way home and had two diarrheal stools which were red. She had some beet juice earlier in the day but had never had any stool discoloration before. She has not had any further abdominal pain or diarrhea/bleeding since then. Otherwise has been in her usual state of health. No blood thinners.    Home Medications Prior to Admission medications   Medication Sig Start Date End Date Taking? Authorizing Provider  clonazePAM (KLONOPIN) 0.5 MG tablet Take 0.5 mg by mouth at bedtime.    [provider]  diclofenac Sodium (VOLTAREN) 1 % GEL Apply 2 g topically 4 (four) times daily as needed. 05/07/22   Hudnall, Sharyn Lull, MD  fish oil-omega-3 fatty acids 1000 MG capsule Take 1 g by mouth daily.    [provider]  levothyroxine (SYNTHROID) 50 MCG tablet Take 1 tablet by mouth 2 days a week 03/26/22   Philemon Kingdom, MD  levothyroxine (SYNTHROID) 75 MCG tablet Take 1 tablet by mouth 5 days a week 02/08/22   Philemon Kingdom, MD  Selenium 100 MCG CAPS Take 200 mcg by mouth daily.     [provider]  TURMERIC PO Take 200 mg by mouth daily.     [provider]     Allergies    Amoxicillin, Clavulanic acid, Erythromycin, Ibuprofen, and Other   Review of Systems   Review of Systems Please see HPI for pertinent positives and negatives  Physical Exam BP (!) 152/73   Pulse 70   Temp 98.4 F (36.9 C) (Oral)   Resp (!) 21   Ht '5\' 2"'$  (1.575 m)   Wt 47.6 kg   SpO2 100%   BMI 19.20 kg/m   Physical Exam Vitals and nursing note reviewed.  Constitutional:      Appearance: Normal appearance.  HENT:     Head: Normocephalic  and atraumatic.     Nose: Nose normal.     Mouth/Throat:     Mouth: Mucous membranes are moist.  Eyes:     Extraocular Movements: Extraocular movements intact.     Conjunctiva/sclera: Conjunctivae normal.  Cardiovascular:     Rate and Rhythm: Normal rate.  Pulmonary:     Effort: Pulmonary effort is normal.     Breath sounds: Normal breath sounds.  Abdominal:     General: Abdomen is flat.     Palpations: Abdomen is soft.     Tenderness: There is no abdominal tenderness.  Genitourinary:    Comments: Chaperone present. DRE without hemorrhoids or fissures, one small prominent blood vessel at 6 o-clock not currently bleeding. No mass or tenderness on exam. No stool in rectal vault.  Musculoskeletal:        General: No swelling. Normal range of motion.     Cervical back: Neck supple.  Skin:    General: Skin is warm and dry.  Neurological:     General: No focal deficit present.     Mental Status: She is alert.  Psychiatric:        Mood and Affect: Mood normal.     ED Results / Procedures / Treatments   EKG None  Procedures Procedures  Medications  Ordered in the ED Medications - No data to display  Initial Impression and Plan  Patient here with some diarrhea and possible bloody stools vs discoloration from beet juice. She is hemodynamically stable. CBC is normal. BMP unremarkable. Exam is benign. Will check hemoccult, but anticipate she will be able to go home.   ED Course   Clinical Course as of 05/21/22 0134  Mon May 21, 2022  0127 Hemoccult is neg. Plan discharge home. Follow up with PCP. RTED if bleeding returns or for any other concerns.  [CS]    Clinical Course User Index [CS] Truddie Hidden, MD     MDM Rules/Calculators/A&P Medical Decision Making Problems Addressed: Diarrhea, unspecified type: acute illness or injury  Amount and/or Complexity of Data Reviewed Labs: ordered. Decision-making details documented in ED Course.  Risk Decision regarding  hospitalization.    Final Clinical Impression(s) / ED Diagnoses Final diagnoses:  Diarrhea, unspecified type    Rx / DC Orders ED Discharge Orders     None        Truddie Hidden, MD 05/21/22 [phone]

## 2022-05-24 ENCOUNTER — Ambulatory Visit: Payer: Medicare HMO | Admitting: Family Medicine

## 2022-05-24 VITALS — BP 122/70 | Ht 62.0 in | Wt 105.0 lb

## 2022-05-24 DIAGNOSIS — M25552 Pain in left hip: Secondary | ICD-10-CM

## 2022-05-24 DIAGNOSIS — F411 Generalized anxiety disorder: Secondary | ICD-10-CM | POA: Diagnosis not present

## 2022-05-24 NOTE — Progress Notes (Signed)
PCP: Lajean Manes, MD  Subjective:   HPI: Patient is a 81 y.o. female here for left hip and thigh pain.  Long history of left hip pain. She has done physical therapy and hip strengthening exercises without relief. She feels as though her current home exercises are aggravating her pain. She has been walking, occasionally doing yoga (though it has been hurting recently) and wants to know if she should start working out with a trainer. Reports occasional bilateral foot tingling. No back pain currently, though some occasional left gluteal pain.   Past Medical History:  Diagnosis Date   Anxiety    clonezapam   Osteopenia    Thyroid disease     Current Outpatient Medications on File Prior to Visit  Medication Sig Dispense Refill   clonazePAM (KLONOPIN) 0.5 MG tablet Take 0.5 mg by mouth at bedtime.     diclofenac Sodium (VOLTAREN) 1 % GEL Apply 2 g topically 4 (four) times daily as needed. 100 g 0   fish oil-omega-3 fatty acids 1000 MG capsule Take 1 g by mouth daily.     levothyroxine (SYNTHROID) 50 MCG tablet Take 1 tablet by mouth 2 days a week 30 tablet 3   levothyroxine (SYNTHROID) 75 MCG tablet Take 1 tablet by mouth 5 days a week 40 tablet 5   Selenium 100 MCG CAPS Take 200 mcg by mouth daily.      TURMERIC PO Take 200 mg by mouth daily.      No current facility-administered medications on file prior to visit.    Past Surgical History:  Procedure Laterality Date   BIOPSY BREAST     COLONOSCOPY     OOPHORECTOMY     UPPER GASTROINTESTINAL ENDOSCOPY      Allergies  Allergen Reactions   Amoxicillin Rash    Has patient had a PCN reaction causing immediate rash, facial/tongue/throat swelling, SOB or lightheadedness with hypotension:No Has patient had a PCN reaction causing severe rash involving mucus membranes or skin necrosis:No Has patient had a PCN reaction that required hospitalization:No Has patient had a PCN reaction occurring within the last 10 years:Yes If all of the  above answers are "NO", then may proceed with Cephalosporin use.    Clavulanic Acid    Erythromycin    Ibuprofen    Other Rash    All cillins Other reaction(s): tingling in fingers    BP 122/70   Ht '5\' 2"'$  (1.575 m)   Wt 105 lb (47.6 kg)   BMI 19.20 kg/m      01/16/2021   11:38 AM  [facility] Adult Exercise  Frequency of aerobic exercise (# of days/week) 5  Average time in minutes 30  Frequency of strengthening activities (# of days/week) 1        No data to display              Objective:  Physical Exam:  Gen: NAD, comfortable in exam room Back: No gross deformity. No paraspinal or SI joint TTP. No midline or bony TTP. TTP over left piriformis. LE strength 5/5 in all muscle groups including hip abduction. 2+ right patellar tendon DTR, 1+ on left. Negative SLRs. Sensation intact to light touch bilaterally.  Left hip: No deformity. Negative logroll. Pain with FADIR and FABER.    Assessment & Plan:  1. Left hip and thigh pain not improving with exercises, tingling in bilateral feet concerning for worsening lumbar nerve impingement. Last MRI was 1 year ago, which showed progressive spinal stenosis  at the time - concern this has progressed further. We will repeat an MRI. Continue to use voltaren gel for pain relief. Discussed holding off on starting a new workout program until after the MRI and not performing exercises that aggravate her pain. Follow up after MRI.   Virgel Manifold, MS4

## 2022-05-24 NOTE — Patient Instructions (Signed)
We will go ahead with an MRI of your lumbar spine and I will call you with the results, next steps.

## 2022-05-25 ENCOUNTER — Encounter: Payer: Self-pay | Admitting: Family Medicine

## 2022-05-26 ENCOUNTER — Ambulatory Visit
Admission: RE | Admit: 2022-05-26 | Discharge: 2022-05-26 | Disposition: A | Discharge status: Home / Self Care | Payer: Medicare HMO | Source: Ambulatory Visit | Attending: Family Medicine | Admitting: Family Medicine

## 2022-05-26 DIAGNOSIS — M5416 Radiculopathy, lumbar region: Secondary | ICD-10-CM | POA: Diagnosis not present

## 2022-05-26 DIAGNOSIS — M25552 Pain in left hip: Secondary | ICD-10-CM

## 2022-05-26 DIAGNOSIS — M545 Low back pain, unspecified: Secondary | ICD-10-CM | POA: Diagnosis not present

## 2022-05-26 DIAGNOSIS — M48061 Spinal stenosis, lumbar region without neurogenic claudication: Secondary | ICD-10-CM | POA: Diagnosis not present

## 2022-05-28 ENCOUNTER — Telehealth: Payer: Self-pay | Admitting: *Deleted

## 2022-05-28 NOTE — Telephone Encounter (Signed)
     Patient  visit on 05/21/2022  at [facility] was for Diarrhea.  Have you been able to follow up with your primary care physician? yes  The patient was  able to obtain any needed medicine or equipment.  Are there diet recommendations that you are having difficulty following?NA will not drink beet juice as that brought on her issue  Patient expresses understanding of discharge instructions and education provided has no other needs at this time.   [facility] [phone] [address] , [place] [address] Email : [email or web address]

## 2022-05-30 ENCOUNTER — Encounter: Payer: Self-pay | Admitting: Family Medicine

## 2022-05-30 ENCOUNTER — Other Ambulatory Visit: Payer: Self-pay | Admitting: *Deleted

## 2022-05-30 DIAGNOSIS — M25552 Pain in left hip: Secondary | ICD-10-CM

## 2022-06-02 ENCOUNTER — Encounter: Payer: Self-pay | Admitting: Sports Medicine

## 2022-06-04 ENCOUNTER — Other Ambulatory Visit: Payer: Medicare HMO

## 2022-06-06 ENCOUNTER — Encounter: Payer: Self-pay | Admitting: Family Medicine

## 2022-06-06 DIAGNOSIS — F411 Generalized anxiety disorder: Secondary | ICD-10-CM | POA: Diagnosis not present

## 2022-06-07 ENCOUNTER — Other Ambulatory Visit: Payer: Self-pay

## 2022-06-07 DIAGNOSIS — E890 Postprocedural hypothyroidism: Secondary | ICD-10-CM

## 2022-06-07 MED ORDER — LEVOTHYROXINE SODIUM 50 MCG PO TABS: ORAL_TABLET | ORAL | 1 refills | Status: DC

## 2022-06-07 MED ORDER — LEVOTHYROXINE SODIUM 75 MCG PO TABS: ORAL_TABLET | ORAL | 1 refills | Status: DC

## 2022-06-08 ENCOUNTER — Encounter: Payer: Self-pay | Admitting: Sports Medicine

## 2022-06-11 DIAGNOSIS — F411 Generalized anxiety disorder: Secondary | ICD-10-CM | POA: Diagnosis not present

## 2022-06-11 DIAGNOSIS — Z0000 Encounter for general adult medical examination without abnormal findings: Secondary | ICD-10-CM | POA: Diagnosis not present

## 2022-06-11 DIAGNOSIS — E559 Vitamin D deficiency, unspecified: Secondary | ICD-10-CM | POA: Diagnosis not present

## 2022-06-11 DIAGNOSIS — I700 Atherosclerosis of aorta: Secondary | ICD-10-CM | POA: Diagnosis not present

## 2022-06-11 DIAGNOSIS — Z79899 Other long term (current) drug therapy: Secondary | ICD-10-CM | POA: Diagnosis not present

## 2022-06-11 DIAGNOSIS — I351 Nonrheumatic aortic (valve) insufficiency: Secondary | ICD-10-CM | POA: Diagnosis not present

## 2022-06-11 DIAGNOSIS — E7800 Pure hypercholesterolemia, unspecified: Secondary | ICD-10-CM | POA: Diagnosis not present

## 2022-06-11 DIAGNOSIS — E039 Hypothyroidism, unspecified: Secondary | ICD-10-CM | POA: Diagnosis not present

## 2022-06-12 ENCOUNTER — Encounter: Payer: Self-pay | Admitting: Sports Medicine

## 2022-06-12 ENCOUNTER — Ambulatory Visit
Admission: RE | Admit: 2022-06-12 | Discharge: 2022-06-12 | Disposition: A | Discharge status: Home / Self Care | Payer: Medicare HMO | Source: Ambulatory Visit | Attending: Sports Medicine | Admitting: Sports Medicine

## 2022-06-12 ENCOUNTER — Ambulatory Visit: Payer: Medicare HMO | Admitting: Sports Medicine

## 2022-06-12 VITALS — BP 135/50 | Ht 62.0 in

## 2022-06-12 DIAGNOSIS — M25552 Pain in left hip: Secondary | ICD-10-CM

## 2022-06-12 NOTE — Progress Notes (Signed)
   Established Patient Office Visit  Subjective   Patient ID: Glenda Perez, female    DOB: [birth year]  Age: 81 y.o. MRN: [id number]  Left-sided hip/thigh pain  Patient reports she is still having regular pain on the left side located in her low back/buttocks area and to follow-up on her MRI results, lumbar spine.  She has not started any gym routine but has been participating in chair yoga.  Patient was initially given hip rehabilitation exercises however she reports this was worsening her pain.  Her pain is worse when ambulating upstairs with left lead leg and walking uphill.  Patient is concerned that she will be able to dance October 21 at her grandsons bar mitzvah.  She denies any radiation of pain down into her leg, weakness numbness or tingling.    Objective:     BP (!) 135/50   Ht '5\' 2"'$  (1.575 m)   BMI 19.20 kg/m   Physical Exam Vitals reviewed.  Constitutional:      General: She is not in acute distress.    Appearance: Normal appearance. She is not ill-appearing, toxic-appearing or diaphoretic.  Pulmonary:     Effort: Pulmonary effort is normal.  Neurological:     Mental Status: She is alert.   Left hip: No obvious deformity or asymmetry.  No ecchymosis or edema.  Slight tenderness to palpation about the gluteus medius muscle/piriformis and iliotibial band.  Decreased range of motion with flexion, internal and external rotation.  Sensation intact to light touch in bilateral lower extremities. Leg length discrepancy noted.  Nonantalgic gait however she does have eversion of the left foot during ambulation.   Assessment & Plan:   Problem List Items Addressed This Visit       Other   Pain of left hip - Primary    Patient at this time has discontinued all stretching and strengthening exercises of the left hip as her pain is aggravated by this.  I did review her MRI results which showed some degenerative changes however she has minimal radicular symptoms at this time.   Based on her physical exam I believe her pathology may be intra-articular of the left hip.  CT scan abdomen reviewed, 2021 exam, mild osteoarthritic changes.  X-rays of the left hip ordered today.  We will hold off on adding any medications to her regimen at this time.  We will call her with hip x-ray results to determine further management.      Relevant Orders   DG HIP UNILAT WITH PELVIS 2-3 VIEWS LEFT    Return in about 2 weeks (around 06/26/2022) for follow up xray results.    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  I discussed with the patient and she will get an opinion from orthopedic surgery.  We can use CSI if needed for symptoms. I observed and examined the patient with the resident and agree with assessment and plan.  Note reviewed and modified by me. Ila Mcgill, MD

## 2022-06-12 NOTE — Assessment & Plan Note (Signed)
Patient at this time has discontinued all stretching and strengthening exercises of the left hip as her pain is aggravated by this.  I did review her MRI results which showed some degenerative changes however she has minimal radicular symptoms at this time.  Based on her physical exam I believe her pathology may be intra-articular of the left hip.  CT scan abdomen reviewed, 2021 exam, mild osteoarthritic changes.  X-rays of the left hip ordered today.  We will hold off on adding any medications to her regimen at this time.  We will call her with hip x-ray results to determine further management.

## 2022-06-14 ENCOUNTER — Telehealth: Payer: Medicare HMO | Admitting: Physician Assistant

## 2022-06-14 DIAGNOSIS — U071 COVID-19: Secondary | ICD-10-CM

## 2022-06-14 MED ORDER — ONDANSETRON 4 MG PO TBDP: 4.0000 mg | ORAL_TABLET | Freq: Three times a day (TID) | ORAL | 0 refills | Status: DC | PRN

## 2022-06-14 MED ORDER — MOLNUPIRAVIR EUA 200MG CAPSULE: 4.0000 | ORAL_CAPSULE | Freq: Two times a day (BID) | ORAL | 0 refills | Status: AC

## 2022-06-14 MED ORDER — ACETAMINOPHEN 500 MG PO TABS: 500.0000 mg | ORAL_TABLET | Freq: Four times a day (QID) | ORAL | 0 refills | Status: DC | PRN

## 2022-06-14 NOTE — Progress Notes (Addendum)
Virtual Visit Consent   Glenda Perez, you are scheduled for a virtual visit with a [facility] provider today. Just as with appointments in the office, your consent must be obtained to participate. Your consent will be active for this visit and any virtual visit you may have with one of our providers in the next 365 days. If you have a MyChart account, a copy of this consent can be sent to you electronically.  As this is a virtual visit, video technology does not allow for your provider to perform a traditional examination. This may limit your provider's ability to fully assess your condition. If your provider identifies any concerns that need to be evaluated in person or the need to arrange testing (such as labs, EKG, etc.), we will make arrangements to do so. Although advances in technology are sophisticated, we cannot ensure that it will always work on either your end or our end. If the connection with a video visit is poor, the visit may have to be switched to a telephone visit. With either a video or telephone visit, we are not always able to ensure that we have a secure connection.  By engaging in this virtual visit, you consent to the provision of healthcare and authorize for your insurance to be billed (if applicable) for the services provided during this visit. Depending on your insurance coverage, you may receive a charge related to this service.  I need to obtain your verbal consent now. Are you willing to proceed with your visit today? Glenda Perez has provided verbal consent on 06/14/2022 for a virtual visit (video or telephone). Leeanne Rio, [place]  Date: 06/14/2022 12:21 PM  Virtual Visit via Video Note   I, Leeanne Rio, connected with  Glenda Perez  ([id number], 02-Aug-1941) on 06/14/22 at 12:00 PM EDT by a video-enabled telemedicine application and verified that I am speaking with the correct person using two identifiers.  Location: Patient: Virtual Visit  Location Patient: Home Provider: Virtual Visit Location Provider: Home Office   I discussed the limitations of evaluation and management by telemedicine and the availability of in person appointments. The patient expressed understanding and agreed to proceed.    History of Present Illness: Glenda Perez is a 81 y.o. who identifies as a female who was assigned female at birth, and is being seen today for COVID-19. Notes symptoms starting Tuesday with nausea, fatigue, anorexia and scratchy throat.  Denies any substantial fever or chills.  Some mild congestion but no substantial cough.  Denies chest pain or shortness of breath.  Denies other GI symptoms.  Was able to eat breakfast this morning without any substantial nausea.  To her knowledge, she has never had COVID before.  Endorses being up-to-date on immunizations. She has tested positive for COVID on home test.    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   DEGEN LUMBAR/LUMBOSACRAL INTERVERTEBRAL DISC 05/12/2008   LOW BACK PAIN SYNDROME 05/12/2008   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    Clavulanic Acid    Erythromycin    Ibuprofen    Other Rash    All cillins Other reaction(s): tingling in fingers   Medications:  Current Outpatient Medications:    acetaminophen (TYLENOL) 500 MG tablet, Take 1 tablet (500 mg total) by mouth every 6 (six) hours as needed., Disp: 30 tablet, Rfl: 0   molnupiravir EUA (LAGEVRIO) 200 mg CAPS capsule, Take 4 capsules (800 mg total) by mouth 2 (two) times daily for 5 days., Disp: 40 capsule, Rfl: 0   ondansetron (ZOFRAN-ODT) 4 MG disintegrating tablet, Take 1 tablet (4 mg total) by mouth every 8 (eight) hours as needed for nausea or vomiting., Disp: 20 tablet, Rfl: 0   clonazePAM (KLONOPIN) 0.5 MG tablet, Take 0.5 mg by mouth at bedtime., Disp: , Rfl:    fish oil-omega-3 fatty acids 1000 MG capsule, Take 1 g by mouth daily., Disp: , Rfl:    levothyroxine (SYNTHROID) 50 MCG tablet, Take 1 tablet by mouth 2 days a week, Disp: 26 tablet, Rfl: 1   levothyroxine (SYNTHROID) 75 MCG tablet, Take 1 tablet by mouth 5 days a week, Disp: 62 tablet, Rfl: 1   Selenium 100 MCG CAPS, Take 200 mcg by mouth daily. , Disp: , Rfl:    TURMERIC PO, Take 200 mg by mouth daily. , Disp: , Rfl:   Observations/Objective: Patient is well-developed, well-nourished in no acute distress.  Resting comfortably at home.  Head is normocephalic, atraumatic.  No labored breathing. Speech is clear and coherent with logical content.  Patient is alert and oriented at baseline.   Assessment and Plan: 1. COVID-19 - MyChart COVID-19 home monitoring program; Future - ondansetron (ZOFRAN-ODT) 4 MG disintegrating tablet; Take 1 tablet (4 mg total) by mouth every 8 (eight) hours as needed for nausea or vomiting.  Dispense: 20 tablet; Refill:  0 - molnupiravir EUA (LAGEVRIO) 200 mg CAPS capsule; Take 4 capsules (800 mg total) by mouth 2 (two) times daily for 5 days.  Dispense: 40 capsule; Refill: 0  Patient with multiple risk factors for complicated course of illness. Discussed risks/benefits of antiviral medications including most common potential ADRs. Patient voiced understanding and would like to proceed with antiviral medication.  She is concerned about use of Paxlovid as she had a friend that took it and had a syncopal event . they are candidate for molnupiravir. Rx sent to pharmacy. Supportive measures, OTC medications and vitamin regimen reviewed. Will put zofran on file just in case needed for nausea. Patient has been enrolled in a MyChart COVID symptom monitoring program. Samule Dry reviewed in detail. Strict ER precautions discussed with patient.    Follow Up Instructions: I discussed the assessment and treatment plan with the patient. The patient was provided an opportunity to ask questions and all were answered. The patient agreed with the plan and demonstrated an understanding of the instructions.  A copy of instructions were sent to the patient via MyChart unless otherwise noted  below.   The patient was advised to call back or seek an in-person evaluation if the symptoms worsen or if the condition fails to improve as anticipated.  Time:  I spent 10 minutes with the patient via telehealth technology discussing the above problems/concerns.    Leeanne Rio, PA-C

## 2022-06-14 NOTE — Patient Instructions (Signed)
Glenda Perez, thank you for joining Glenda Perez, Glenda Perez for today's virtual visit.  While this provider is not your primary care provider (PCP), if your PCP is located in our provider database this encounter information will be shared with them immediately following your visit.  Consent: (Patient) Glenda Perez provided verbal consent for this virtual visit at the beginning of the encounter.  Current Medications:  Current Outpatient Medications:    clonazePAM (KLONOPIN) 0.5 MG tablet, Take 0.5 mg by mouth at bedtime., Disp: , Rfl:    fish oil-omega-3 fatty acids 1000 MG capsule, Take 1 g by mouth daily., Disp: , Rfl:    levothyroxine (SYNTHROID) 50 MCG tablet, Take 1 tablet by mouth 2 days a week, Disp: 26 tablet, Rfl: 1   levothyroxine (SYNTHROID) 75 MCG tablet, Take 1 tablet by mouth 5 days a week, Disp: 62 tablet, Rfl: 1   Selenium 100 MCG CAPS, Take 200 mcg by mouth daily. , Disp: , Rfl:    TURMERIC PO, Take 200 mg by mouth daily. , Disp: , Rfl:    Medications ordered in this encounter:  No orders of the defined types were placed in this encounter.    *If you need refills on other medications prior to your next appointment, please contact your pharmacy*  Follow-Up: Call back or seek an in-person evaluation if the symptoms worsen or if the condition fails to improve as anticipated.  Other Instructions Please keep well-hydrated and get plenty of rest. Start a saline nasal rinse to flush out your nasal passages. You can use plain Mucinex to help thin congestion. If you have a humidifier, running in the bedroom at night. I want you to start OTC vitamin D3 1000 units daily, vitamin C 1000 mg daily, and a zinc supplement. Please take prescribed medications as directed.  You have been enrolled in a MyChart symptom monitoring program. Please answer these questions daily so we can keep track of how you are doing.  You were to quarantine for 5 days from onset of your  symptoms.  After day 5, if you have had no fever and you are feeling better, you can end quarantine but need to mask for an additional 5 days. After day 5 if you have a fever or are having significant symptoms, please quarantine for full 10 days.  If you note any worsening of symptoms, any significant shortness of breath or any chest pain, please seek ER evaluation ASAP.  Please do not delay care!  COVID-19: What to Do if You Are Sick If you test positive and are an older adult or someone who is at high risk of getting very sick from COVID-19, treatment may be available. Contact a healthcare provider right away after a positive test to determine if you are eligible, even if your symptoms are mild right now. You can also visit a Test to Treat location and, if eligible, receive a prescription from a provider. Don't delay: Treatment must be started within the first few days to be effective. If you have a fever, cough, or other symptoms, you might have COVID-19. Most people have mild illness and are able to recover at home. If you are sick: Keep track of your symptoms. If you have an emergency warning sign (including trouble breathing), call 911. Steps to help prevent the spread of COVID-19 if you are sick If you are sick with COVID-19 or think you might have COVID-19, follow the steps below to care for yourself and to help protect other  people in your home and community. Stay home except to get medical care Stay home. Most people with COVID-19 have mild illness and can recover at home without medical care. Do not leave your home, except to get medical care. Do not visit public areas and do not go to places where you are unable to wear a mask. Take care of yourself. Get rest and stay hydrated. Take over-the-counter medicines, such as acetaminophen, to help you feel better. Stay in touch with your doctor. Call before you get medical care. Be sure to get care if you have trouble breathing, or have any other  emergency warning signs, or if you think it is an emergency. Avoid public transportation, ride-sharing, or taxis if possible. Get tested If you have symptoms of COVID-19, get tested. While waiting for test results, stay away from others, including staying apart from those living in your household. Get tested as soon as possible after your symptoms start. Treatments may be available for people with COVID-19 who are at risk for becoming very sick. Don't delay: Treatment must be started early to be effective--some treatments must begin within 5 days of your first symptoms. Contact your healthcare provider right away if your test result is positive to determine if you are eligible. Self-tests are one of several options for testing for the virus that causes COVID-19 and may be more convenient than laboratory-based tests and point-of-care tests. Ask your healthcare provider or your local health department if you need help interpreting your test results. You can visit your state, tribal, local, and territorial health department's website to look for the latest local information on testing sites. Separate yourself from other people As much as possible, stay in a specific room and away from other people and pets in your home. If possible, you should use a separate bathroom. If you need to be around other people or animals in or outside of the home, wear a well-fitting mask. Tell your close contacts that they may have been exposed to COVID-19. An infected person can spread COVID-19 starting 48 hours (or 2 days) before the person has any symptoms or tests positive. By letting your close contacts know they may have been exposed to COVID-19, you are helping to protect everyone. See COVID-19 and Animals if you have questions about pets. If you are diagnosed with COVID-19, someone from the health department may call you. Answer the call to slow the spread. Monitor your symptoms Symptoms of COVID-19 include fever,  cough, or other symptoms. Follow care instructions from your healthcare provider and local health department. Your local health authorities may give instructions on checking your symptoms and reporting information. When to seek emergency medical attention Look for emergency warning signs* for COVID-19. If someone is showing any of these signs, seek emergency medical care immediately: Trouble breathing Persistent pain or pressure in the chest New confusion Inability to wake or stay awake Pale, gray, or blue-colored skin, lips, or nail beds, depending on skin tone *This list is not all possible symptoms. Please call your medical provider for any other symptoms that are severe or concerning to you. Call 911 or call ahead to your local emergency facility: Notify the operator that you are seeking care for someone who has or may have COVID-19. Call ahead before visiting your doctor Call ahead. Many medical visits for routine care are being postponed or done by phone or telemedicine. If you have a medical appointment that cannot be postponed, call your doctor's office, and tell them you  have or may have COVID-19. This will help the office protect themselves and other patients. If you are sick, wear a well-fitting mask You should wear a mask if you must be around other people or animals, including pets (even at home). Wear a mask with the best fit, protection, and comfort for you. You don't need to wear the mask if you are alone. If you can't put on a mask (because of trouble breathing, for example), cover your coughs and sneezes in some other way. Try to stay at least 6 feet away from other people. This will help protect the people around you. Masks should not be placed on young children under age 15 years, anyone who has trouble breathing, or anyone who is not able to remove the mask without help. Cover your coughs and sneezes Cover your mouth and nose with a tissue when you cough or sneeze. Throw away  used tissues in a lined trash can. Immediately wash your hands with soap and water for at least 20 seconds. If soap and water are not available, clean your hands with an alcohol-based hand sanitizer that contains at least 60% alcohol. Clean your hands often Wash your hands often with soap and water for at least 20 seconds. This is especially important after blowing your nose, coughing, or sneezing; going to the bathroom; and before eating or preparing food. Use hand sanitizer if soap and water are not available. Use an alcohol-based hand sanitizer with at least 60% alcohol, covering all surfaces of your hands and rubbing them together until they feel dry. Soap and water are the best option, especially if hands are visibly dirty. Avoid touching your eyes, nose, and mouth with unwashed hands. Handwashing Tips Avoid sharing personal household items Do not share dishes, drinking glasses, cups, eating utensils, towels, or bedding with other people in your home. Wash these items thoroughly after using them with soap and water or put in the dishwasher. Clean surfaces in your home regularly Clean and disinfect high-touch surfaces (for example, doorknobs, tables, handles, light switches, and countertops) in your "sick room" and bathroom. In shared spaces, you should clean and disinfect surfaces and items after each use by the person who is ill. If you are sick and cannot clean, a caregiver or other person should only clean and disinfect the area around you (such as your bedroom and bathroom) on an as needed basis. Your caregiver/other person should wait as long as possible (at least several hours) and wear a mask before entering, cleaning, and disinfecting shared spaces that you use. Clean and disinfect areas that may have blood, stool, or body fluids on them. Use household cleaners and disinfectants. Clean visible dirty surfaces with household cleaners containing soap or detergent. Then, use a household  disinfectant. Use a product from [employer] List N: Disinfectants for Coronavirus (UVOZD-66). Be sure to follow the instructions on the label to ensure safe and effective use of the product. Many products recommend keeping the surface wet with a disinfectant for a certain period of time (look at "contact time" on the product label). You may also need to wear personal protective equipment, such as gloves, depending on the directions on the product label. Immediately after disinfecting, wash your hands with soap and water for 20 seconds. For completed guidance on cleaning and disinfecting your home, visit Complete Disinfection Guidance. Take steps to improve ventilation at home Improve ventilation (air flow) at home to help prevent from spreading COVID-19 to other people in your household. Clear out  COVID-19 virus particles in the air by opening windows, using air filters, and turning on fans in your home. Use this interactive tool to learn how to improve air flow in your home. When you can be around others after being sick with COVID-19 Deciding when you can be around others is different for different situations. Find out when you can safely end home isolation. For any additional questions about your care, contact your healthcare provider or state or local health department. 01/10/2021 Content source: [facility] ([facility]), Division of Viral Diseases This information is not intended to replace advice given to you by your health care provider. Make sure you discuss any questions you have with your health care provider. Document Revised: 02/23/2021 Document Reviewed: 02/23/2021 Elsevier Patient Education  2022 [employer].      If you have been instructed to have an in-person evaluation today at a local Urgent Care facility, please use the link below. It will take you to a list of all of our available [facility], including address, phone  number and hours of operation. Please do not delay care.  [facility]  If you or a family member do not have a primary care provider, use the link below to schedule a visit and establish care. When you choose a [facility] primary care physician or advanced practice provider, you gain a long-term partner in health. Find a Primary Care Provider  Learn more about [facility]'s in-office and virtual care options: [facility]

## 2022-06-16 ENCOUNTER — Encounter: Payer: Self-pay | Admitting: Sports Medicine

## 2022-06-19 ENCOUNTER — Encounter: Payer: Self-pay | Admitting: *Deleted

## 2022-06-19 NOTE — Progress Notes (Signed)
Dr Wynelle Link 11.9.23 @ 1030a [facility] [address] [place] [phone]

## 2022-06-23 DIAGNOSIS — F411 Generalized anxiety disorder: Secondary | ICD-10-CM | POA: Diagnosis not present

## 2022-06-26 DIAGNOSIS — L292 Pruritus vulvae: Secondary | ICD-10-CM | POA: Diagnosis not present

## 2022-06-29 DIAGNOSIS — I351 Nonrheumatic aortic (valve) insufficiency: Secondary | ICD-10-CM | POA: Diagnosis not present

## 2022-07-02 ENCOUNTER — Encounter: Payer: Self-pay | Admitting: Sports Medicine

## 2022-07-03 ENCOUNTER — Ambulatory Visit: Payer: Medicare HMO | Admitting: Sports Medicine

## 2022-07-03 ENCOUNTER — Ambulatory Visit: Payer: Medicare HMO | Admitting: Dermatology

## 2022-07-04 DIAGNOSIS — F411 Generalized anxiety disorder: Secondary | ICD-10-CM | POA: Diagnosis not present

## 2022-07-16 ENCOUNTER — Encounter: Payer: Self-pay | Admitting: Sports Medicine

## 2022-07-19 ENCOUNTER — Ambulatory Visit: Payer: Medicare HMO | Admitting: Sports Medicine

## 2022-07-24 DIAGNOSIS — F411 Generalized anxiety disorder: Secondary | ICD-10-CM | POA: Diagnosis not present

## 2022-07-30 ENCOUNTER — Ambulatory Visit: Payer: Self-pay

## 2022-07-30 ENCOUNTER — Ambulatory Visit: Payer: Medicare HMO | Admitting: Family Medicine

## 2022-07-30 VITALS — BP 140/60 | Ht 62.0 in

## 2022-07-30 DIAGNOSIS — M25552 Pain in left hip: Secondary | ICD-10-CM | POA: Diagnosis not present

## 2022-07-30 MED ORDER — METHYLPREDNISOLONE ACETATE 40 MG/ML IJ SUSP
40.0000 mg | Freq: Once | INTRAMUSCULAR | Status: AC
  Administered 2022-07-30: 40 mg via INTRA_ARTICULAR

## 2022-07-30 NOTE — Progress Notes (Signed)
PCP: Lajean Manes, MD  Subjective:   HPI: Patient is a 81 y.o. female here for left hip injection.  Patient returns today for left hip intraarticular injection. She has a bar mitzvah on 10/21 - concerned she won't be able to dance there. At last visit with Dr. Oneida Alar she did not have any radicular back pain and seemed to be more from her hip at that time - x-rays showed severe hip arthritis - plan to do hip injection at least a week before her trip so that's why she's here today. Most of her pain is posterior in the gluteal region.  Past Medical History:  Diagnosis Date   Anxiety    clonezapam   Osteopenia    Thyroid disease     Current Outpatient Medications on File Prior to Visit  Medication Sig Dispense Refill   acetaminophen (TYLENOL) 500 MG tablet Take 1 tablet (500 mg total) by mouth every 6 (six) hours as needed. 30 tablet 0   clonazePAM (KLONOPIN) 0.5 MG tablet Take 0.5 mg by mouth at bedtime.     fish oil-omega-3 fatty acids 1000 MG capsule Take 1 g by mouth daily.     levothyroxine (SYNTHROID) 50 MCG tablet Take 1 tablet by mouth 2 days a week 26 tablet 1   levothyroxine (SYNTHROID) 75 MCG tablet Take 1 tablet by mouth 5 days a week 62 tablet 1   ondansetron (ZOFRAN-ODT) 4 MG disintegrating tablet Take 1 tablet (4 mg total) by mouth every 8 (eight) hours as needed for nausea or vomiting. 20 tablet 0   Selenium 100 MCG CAPS Take 200 mcg by mouth daily.      TURMERIC PO Take 200 mg by mouth daily.      No current facility-administered medications on file prior to visit.    Past Surgical History:  Procedure Laterality Date   BIOPSY BREAST     COLONOSCOPY     OOPHORECTOMY     UPPER GASTROINTESTINAL ENDOSCOPY      Allergies  Allergen Reactions   Amoxicillin Rash    Has patient had a PCN reaction causing immediate rash, facial/tongue/throat swelling, SOB or lightheadedness with hypotension:No Has patient had a PCN reaction causing severe rash involving mucus  membranes or skin necrosis:No Has patient had a PCN reaction that required hospitalization:No Has patient had a PCN reaction occurring within the last 10 years:Yes If all of the above answers are "NO", then may proceed with Cephalosporin use.    Clavulanic Acid    Erythromycin    Ibuprofen    Other Rash    All cillins Other reaction(s): tingling in fingers    BP (!) 140/60   Ht '5\' 2"'$  (1.575 m)   BMI 19.20 kg/m      01/16/2021   11:38 AM  [facility]  Frequency of aerobic exercise (# of days/week) 5  Average time in minutes 30  Frequency of strengthening activities (# of days/week) 1        No data to display              Objective:  Physical Exam:  Gen: NAD, comfortable in exam room   Assessment & Plan:  1. Left hip pain - noted severe hip arthritis on radiographs from August.  Injection performed today as below.   After informed written consent timeout was performed, patient was lying supine on exam table.  Area overlying left hip joint prepped with alcohol swab then utilizing ultrasound guidance, patient's left hip  joint was injected with 4:1 lidocaine: depomedrol '80mg'$ .  Patient tolerated procedure well without immediate complications.

## 2022-08-01 ENCOUNTER — Encounter: Payer: Self-pay | Admitting: Family Medicine

## 2022-08-01 ENCOUNTER — Ambulatory Visit: Payer: Medicare HMO | Admitting: Family Medicine

## 2022-08-16 ENCOUNTER — Encounter: Payer: Self-pay | Admitting: Sports Medicine

## 2022-08-22 DIAGNOSIS — M1611 Unilateral primary osteoarthritis, right hip: Secondary | ICD-10-CM | POA: Diagnosis not present

## 2022-08-22 DIAGNOSIS — M25552 Pain in left hip: Secondary | ICD-10-CM | POA: Diagnosis not present

## 2022-08-22 DIAGNOSIS — M1612 Unilateral primary osteoarthritis, left hip: Secondary | ICD-10-CM | POA: Diagnosis not present

## 2022-08-22 DIAGNOSIS — M160 Bilateral primary osteoarthritis of hip: Secondary | ICD-10-CM | POA: Diagnosis not present

## 2022-08-30 ENCOUNTER — Ambulatory Visit: Payer: Medicare HMO | Admitting: Sports Medicine

## 2022-08-30 ENCOUNTER — Ambulatory Visit (INDEPENDENT_AMBULATORY_CARE_PROVIDER_SITE_OTHER): Payer: Medicare HMO | Admitting: Sports Medicine

## 2022-08-30 VITALS — BP 120/60 | Ht 62.5 in | Wt 106.0 lb

## 2022-08-30 DIAGNOSIS — M25552 Pain in left hip: Secondary | ICD-10-CM | POA: Diagnosis not present

## 2022-08-30 DIAGNOSIS — R269 Unspecified abnormalities of gait and mobility: Secondary | ICD-10-CM

## 2022-08-30 NOTE — Progress Notes (Signed)
Chief complaint gait feels unsteady on the side of her hip osteoarthritis  Patient was diagnosed with advanced left hip osteoarthritis I suggested she get an opinion about an anterior total hip replacement She has seen Dr.Olin and will have this hip replacement done in about 1 month Her pain is not severe and usually controlled with Tylenol However this does limit her ability to walk and do many of the activities she would like to do  She wonders if improving her gait might help her do more exercise And getting prepared for the hip replacement  Physical exam Pleasant older female in no acute distress BP 120/60   Ht 5' 2.5" (1.588 m)   Wt 106 lb (48.1 kg)   BMI 19.08 kg/m  Left hip shows 0 degrees of internal rotation and only about 30 degrees of external rotation There is also a decrease in left hip flexion On measurement the left leg now measures about 1-1/2 cm shorter On walking gait she has a mild Trendelenburg to the left

## 2022-08-30 NOTE — Assessment & Plan Note (Signed)
I placed a 1 cm lift and that eliminated most of the trendelenburg I suggested a series of exercises over the next month to have her strong and prepared for surgery

## 2022-08-30 NOTE — Assessment & Plan Note (Signed)
Documented severe OA For THR in 1 month by Dr. Alvan Dame

## 2022-09-04 ENCOUNTER — Encounter: Payer: Self-pay | Admitting: Sports Medicine

## 2022-09-05 ENCOUNTER — Other Ambulatory Visit (HOSPITAL_BASED_OUTPATIENT_CLINIC_OR_DEPARTMENT_OTHER): Payer: Self-pay

## 2022-09-10 DIAGNOSIS — F419 Anxiety disorder, unspecified: Secondary | ICD-10-CM | POA: Diagnosis not present

## 2022-09-10 DIAGNOSIS — Z01818 Encounter for other preprocedural examination: Secondary | ICD-10-CM | POA: Diagnosis not present

## 2022-09-10 DIAGNOSIS — M1612 Unilateral primary osteoarthritis, left hip: Secondary | ICD-10-CM | POA: Diagnosis not present

## 2022-09-10 DIAGNOSIS — I351 Nonrheumatic aortic (valve) insufficiency: Secondary | ICD-10-CM | POA: Diagnosis not present

## 2022-09-11 NOTE — Progress Notes (Signed)
Surgery orders requested via Epic inbox. °

## 2022-09-12 NOTE — Progress Notes (Addendum)
Anesthesia Review:  PCP: DR Louis Matte with [facility]- Called and requested most recent ov note 09/10/22 on chart  Cardiologist : note.   Chest x-ray : EKG : 2021- office to fax - 07/27/20 and 09/19/22  Echo : 2021 , recent echo- 2023- being faxed by office Echo -06/29/22 on chart  Stress test: Cardiac Cath :  Activity level: can do a flight of stairs without difficutly   Sleep Study/ CPAP : none  Fasting Blood Sugar :      / Checks Blood Sugar -- times a day:   Blood Thinner/ Instructions /Last Dose: ASA / Instructions/ Last Dose :   PT reports a " leaky valve" at preop.  Not followed by cardiology.  Followed by PCP.   Recent echo- offce to fax ov note and echo and most recent EKG.

## 2022-09-17 NOTE — Patient Instructions (Addendum)
SURGICAL WAITING ROOM VISITATION Patients having surgery or a procedure may have no more than 2 support people in the waiting area - these visitors may rotate.   Children under the age of 59 must have an adult with them who is not the patient. If the patient needs to stay at the hospital during part of their recovery, the visitor guidelines for inpatient rooms apply. Pre-op nurse will coordinate an appropriate time for 1 support person to accompany patient in pre-op.  This support person may not rotate.    Please refer to the [facility] website for the visitor guidelines for Inpatients (after your surgery is over and you are in a regular room).       Your procedure is scheduled on:  10/02/2022    Report to [facility] Main Entrance    Report to admitting at  0900 AM   Call this number if you have problems the morning of surgery [phone]   Do not eat food :After Midnight.   After Midnight you may have the following liquids until __ 0830____ AM DAY OF SURGERY  Water Non-Citrus Juices (without pulp, NO RED) Carbonated Beverages Black Coffee (NO MILK/CREAM OR CREAMERS, sugar ok)  Clear Tea (NO MILK/CREAM OR CREAMERS, sugar ok) regular and decaf                             Plain Jell-O (NO RED)                                           Fruit ices (not with fruit pulp, NO RED)                                     Popsicles (NO RED)                                                               Sports drinks like Gatorade (NO RED)                    The day of surgery:  Drink ONE (1) Pre-Surgery Clear Ensure or G2 at   0830AM  ( have completed by ) the morning of surgery. Drink in one sitting. Do not sip.  This drink was given to you during your hospital  pre-op appointment visit. Nothing else to drink after completing the  Pre-Surgery Clear Ensure or G2.          If you have questions, please contact your surgeon's office.       Oral Hygiene is also important to  reduce your risk of infection.                                    Remember - BRUSH YOUR TEETH THE MORNING OF SURGERY WITH YOUR REGULAR TOOTHPASTE   Do NOT smoke after Midnight   Take these medicines the morning of surgery with A SIP OF WATER:  Synthroid   DO NOT TAKE  ANY ORAL DIABETIC MEDICATIONS DAY OF YOUR SURGERY  Bring CPAP mask and tubing day of surgery.                              You may not have any metal on your body including hair pins, jewelry, and body piercing             Do not wear make-up, lotions, powders, perfumes/cologne, or deodorant  Do not wear nail polish including gel and S&S, artificial/acrylic nails, or any other type of covering on natural nails including finger and toenails. If you have artificial nails, gel coating, etc. that needs to be removed by a nail salon please have this removed prior to surgery or surgery may need to be canceled/ delayed if the surgeon/ anesthesia feels like they are unable to be safely monitored.   Do not shave  48 hours prior to surgery.               Men may shave face and neck.   Do not bring valuables to the hospital. [place].   Contacts, dentures or bridgework may not be worn into surgery.   Bring small overnight bag day of surgery.   DO NOT [place]. PHARMACY WILL DISPENSE MEDICATIONS LISTED ON YOUR MEDICATION LIST TO YOU DURING YOUR ADMISSION [place]!    Patients discharged on the day of surgery will not be allowed to drive home.  Someone NEEDS to stay with you for the first 24 hours after anesthesia.   Special Instructions: Bring a copy of your healthcare power of attorney and living will documents the day of surgery if you haven't scanned them before.              Please read over the following fact sheets you were given: IF [place] [phone]   If you received a  COVID test during your pre-op visit  it is requested that you wear a mask when out in public, stay away from anyone that may not be feeling well and notify your surgeon if you develop symptoms. If you test positive for Covid or have been in contact with anyone that has tested positive in the last 10 days please notify you surgeon.    [place] - Preparing for Surgery Before surgery, you can play an important role.  Because skin is not sterile, your skin needs to be as free of germs as possible.  You can reduce the number of germs on your skin by washing with CHG (chlorahexidine gluconate) soap before surgery.  CHG is an antiseptic cleaner which kills germs and bonds with the skin to continue killing germs even after washing. Please DO NOT use if you have an allergy to CHG or antibacterial soaps.  If your skin becomes reddened/irritated stop using the CHG and inform your nurse when you arrive at Short Stay. Do not shave (including legs and underarms) for at least 48 hours prior to the first CHG shower.  You may shave your face/neck. Please follow these instructions carefully:  1.  Shower with CHG Soap the night before surgery and the  morning of Surgery.  2.  If you choose to wash your hair, wash your hair first as usual with your  normal  shampoo.  3.  After you shampoo, rinse your hair and body thoroughly to remove the  shampoo.                           4.  Use CHG as you would any other liquid soap.  You can apply chg directly  to the skin and wash                       Gently with a scrungie or clean washcloth.  5.  Apply the CHG Soap to your body ONLY FROM THE NECK DOWN.   Do not use on face/ open                           Wound or open sores. Avoid contact with eyes, ears mouth and genitals (private parts).                       Wash face,  Genitals (private parts) with your normal soap.             6.  Wash thoroughly, paying special attention to the area where your surgery  will be  performed.  7.  Thoroughly rinse your body with warm water from the neck down.  8.  DO NOT shower/wash with your normal soap after using and rinsing off  the CHG Soap.                9.  Pat yourself dry with a clean towel.            10.  Wear clean pajamas.            11.  Place clean sheets on your bed the night of your first shower and do not  sleep with pets. Day of Surgery : Do not apply any lotions/deodorants the morning of surgery.  Please wear clean clothes to the hospital/surgery center.  FAILURE TO FOLLOW THESE INSTRUCTIONS MAY RESULT IN THE CANCELLATION OF YOUR SURGERY PATIENT SIGNATURE_________________________________  NURSE SIGNATURE__________________________________  ________________________________________________________________________

## 2022-09-19 ENCOUNTER — Encounter (HOSPITAL_COMMUNITY): Payer: Self-pay

## 2022-09-19 ENCOUNTER — Encounter (HOSPITAL_COMMUNITY)
Admission: RE | Admit: 2022-09-19 | Discharge: 2022-09-19 | Disposition: A | Discharge status: Home / Self Care | Payer: Medicare HMO | Source: Ambulatory Visit | Attending: Orthopedic Surgery | Admitting: Orthopedic Surgery

## 2022-09-19 ENCOUNTER — Other Ambulatory Visit: Payer: Self-pay

## 2022-09-19 VITALS — BP 152/72 | HR 63 | Temp 98.7°F | Resp 16 | Ht 62.5 in | Wt 107.0 lb

## 2022-09-19 DIAGNOSIS — M1612 Unilateral primary osteoarthritis, left hip: Secondary | ICD-10-CM | POA: Diagnosis not present

## 2022-09-19 DIAGNOSIS — Z01818 Encounter for other preprocedural examination: Secondary | ICD-10-CM | POA: Diagnosis not present

## 2022-09-19 HISTORY — DX: Endocarditis, valve unspecified: I38

## 2022-09-19 HISTORY — DX: Unspecified osteoarthritis, unspecified site: M19.90

## 2022-09-19 HISTORY — DX: Other hypoglycemia: E16.1

## 2022-09-19 LAB — CBC
HCT: 41.5 % (ref 36.0–46.0)
Hemoglobin: 13.7 g/dL (ref 12.0–15.0)
MCH: 33.2 pg (ref 26.0–34.0)
MCHC: 33 g/dL (ref 30.0–36.0)
MCV: 100.5 fL — ABNORMAL HIGH (ref 80.0–100.0)
Platelets: 221 10*3/uL (ref 150–400)
RBC: 4.13 MIL/uL (ref 3.87–5.11)
RDW: 13.6 % (ref 11.5–15.5)
WBC: 5 10*3/uL (ref 4.0–10.5)
nRBC: 0 % (ref 0.0–0.2)

## 2022-09-19 LAB — BASIC METABOLIC PANEL
Anion gap: 11 (ref 5–15)
BUN: 15 mg/dL (ref 8–23)
CO2: 27 mmol/L (ref 22–32)
Calcium: 9.5 mg/dL (ref 8.9–10.3)
Chloride: 96 mmol/L — ABNORMAL LOW (ref 98–111)
Creatinine, Ser: 0.62 mg/dL (ref 0.44–1.00)
GFR, Estimated: >60 mL/min (ref >60)
Glucose, Bld: 90 mg/dL (ref 70–99)
Potassium: 3.8 mmol/L (ref 3.5–5.1)
Sodium: 134 mmol/L — ABNORMAL LOW (ref 135–145)

## 2022-09-19 LAB — TYPE AND SCREEN
ABO/RH(D): B POS
Antibody Screen: NEGATIVE

## 2022-09-19 LAB — SURGICAL PCR SCREEN
MRSA, PCR: NEGATIVE
Staphylococcus aureus: NEGATIVE

## 2022-09-20 DIAGNOSIS — M25552 Pain in left hip: Secondary | ICD-10-CM | POA: Diagnosis not present

## 2022-09-20 DIAGNOSIS — M1612 Unilateral primary osteoarthritis, left hip: Secondary | ICD-10-CM | POA: Diagnosis not present

## 2022-09-28 NOTE — Progress Notes (Signed)
Anesthesia Chart Review   Case: [id number] Date/Time: 10/02/22 1115   Procedure: TOTAL HIP ARTHROPLASTY ANTERIOR APPROACH (Left: Hip)   Anesthesia type: Spinal   Pre-op diagnosis: Left hip osteoarthritis   Location: [facility] ROOM 10 / [facility]   Surgeons: Paralee Cancel, MD       DISCUSSION:81 y.o. never smoker with h/o mild aortic regurgitation, left hip OA scheduled for above procedure 10/02/2022 with Dr. Paralee Cancel.   Pt seen by PCP 09/10/2022. Per OV note, "she is low risk for low risk surgery. Activity limited by arthritis but she can tolerate > 4 METS without issue."  Anticipate pt can proceed with planned procedure barring acute status change.   VS: BP (!) 152/72   Pulse 63   Temp 37.1 C (Oral)   Resp 16   Ht 5' 2.5" (1.588 m)   Wt 48.5 kg   SpO2 100%   BMI 19.26 kg/m   PROVIDERS: Charlane Ferretti, MD is PCP    LABS: Labs reviewed: Acceptable for surgery. (all labs ordered are listed, but only abnormal results are displayed)  Labs Reviewed  CBC - Abnormal; Notable for the following components:      Result Value   MCV 100.5 (*)    All other components within normal limits  BASIC METABOLIC PANEL - Abnormal; Notable for the following components:   Sodium 134 (*)    Chloride 96 (*)    All other components within normal limits  SURGICAL PCR SCREEN  TYPE AND SCREEN     IMAGES:   EKG:   CV: Echo 06/29/2022 Left ventricular cavity is normal in size Normal wall thickness Diastolic filling with impaired relaxation pattern Normal global wall motion No left ventricular regional wall motion abnormalities are seen  Normal systolic global function in left ventricle Calculated EF 58.67% Vusual EF is 55-60% (normal) Past Medical History:  Diagnosis Date   Anxiety    clonezapam   Arthritis    Leaky heart valve    Osteopenia    Reactive hypoglycemia    Thyroid disease     Past Surgical History:  Procedure Laterality Date   BIOPSY BREAST     COLONOSCOPY      OOPHORECTOMY     UPPER GASTROINTESTINAL ENDOSCOPY      MEDICATIONS:  acetaminophen (TYLENOL) 500 MG tablet   Calcium Citrate (CITRACAL PO)   Cholecalciferol (VITAMIN D3) 50 MCG (2000 UT) capsule   clonazePAM (KLONOPIN) 0.5 MG tablet   cyanocobalamin (VITAMIN B12) 1000 MCG tablet   diphenhydramine-acetaminophen (TYLENOL PM) 25-500 MG TABS tablet   levothyroxine (SYNTHROID) 50 MCG tablet   levothyroxine (SYNTHROID) 75 MCG tablet   ondansetron (ZOFRAN-ODT) 4 MG disintegrating tablet   selenium 200 MCG TABS tablet   Turmeric 500 MG TABS   No current facility-administered medications for this encounter.     Konrad Felix Ward, PA-C [facility] [phone]

## 2022-09-30 NOTE — H&P (Signed)
TOTAL HIP ADMISSION H&P  Patient is admitted for left total hip arthroplasty.  Subjective:  Chief Complaint: left hip pain  HPI: Glenda Perez, 81 y.o. female, has a history of pain and functional disability in the left hip(s) due to arthritis and patient has failed non-surgical conservative treatments for greater than 12 weeks to include NSAID's and/or analgesics and activity modification.  Onset of symptoms was gradual starting 2 years ago with gradually worsening course since that time.The patient noted no past surgery on the left hip(s).  Patient currently rates pain in the left hip at 8 out of 10 with activity. Patient has worsening of pain with activity and weight bearing, pain that interfers with activities of daily living, and pain with passive range of motion. Patient has evidence of joint space narrowing by imaging studies. This condition presents safety issues increasing the risk of falls.   There is no current active infection.  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   Current Outpatient Medications  Medication Sig Dispense Refill Last Dose   acetaminophen (TYLENOL) 500 MG tablet Take 1 tablet (500 mg total) by mouth every 6 (six) hours as needed. 30 tablet 0    Calcium Citrate (CITRACAL PO) Take 1 tablet by mouth 2 (two) times daily.      Cholecalciferol (VITAMIN D3) 50 MCG (2000 UT) capsule Take 2,000 Units by mouth daily.      clonazePAM (KLONOPIN) 0.5 MG tablet Take 0.5 mg by mouth at bedtime.      cyanocobalamin (VITAMIN B12) 1000 MCG tablet Take 1,000 mcg by mouth daily.      diphenhydramine-acetaminophen (TYLENOL PM) 25-500 MG TABS tablet Take 1 tablet by mouth at bedtime as needed (pain).      levothyroxine (SYNTHROID) 50 MCG tablet Take 1 tablet by mouth 2 days a week (Patient taking differently: Take 50 mcg by mouth See admin instructions. Take 50 mg Saturday and Sunday) 26 tablet 1    levothyroxine (SYNTHROID) 75 MCG tablet Take 1 tablet by mouth 5 days a week 62 tablet 1    selenium 200 MCG TABS tablet Take 200 mcg by mouth daily.      Turmeric 500 MG TABS Take 500 mg by mouth daily.      ondansetron (ZOFRAN-ODT) 4 MG disintegrating tablet Take 1 tablet (4 mg  total) by mouth every 8 (eight) hours as needed for nausea or vomiting. (Patient not taking: Reported on 09/17/2022) 20 tablet 0 Not Taking   Allergies  Allergen Reactions   Amoxicillin Rash    Has patient had a PCN reaction causing immediate rash, facial/tongue/throat swelling, SOB or lightheadedness with hypotension:No Has patient had a PCN reaction causing severe rash involving mucus membranes or skin necrosis:No Has patient had a PCN reaction that required hospitalization:No Has patient had a PCN reaction occurring within the last 10 years:Yes If all of the above answers are "NO", then may  proceed with Cephalosporin use.    Clavulanic Acid    Erythromycin Other (See Comments)    Unknown   Ibuprofen Other (See Comments)    Bloody stool   Penicillins Other (See Comments)    Unknown   Other Rash    All cillins Other reaction(s): tingling in fingers    Social History   Tobacco Use   Smoking status: Never   Smokeless tobacco: Never  Substance Use Topics   Alcohol use: Yes    Alcohol/week: 7.0 standard drinks of alcohol    Types: 7 Glasses of wine per week    Comment: occ wine with dinner    Family History  Problem Relation Age of Onset   Colon cancer Neg Hx    Esophageal cancer Neg Hx    Pancreatic cancer Neg Hx    Rectal cancer Neg Hx    Stomach cancer Neg Hx      Review of Systems  Constitutional:  Negative for chills and fever.  Respiratory:  Negative for cough and shortness of breath.   Cardiovascular:  Negative for chest pain.  Gastrointestinal:  Negative for nausea and vomiting.  Musculoskeletal:  Positive for arthralgias.     Objective:  Physical Exam Well nourished and well developed. General: Alert and oriented x3, cooperative and pleasant, no acute distress. Head: normocephalic, atraumatic, neck supple. Eyes: EOMI.  Musculoskeletal: Left hip exam: She does have reproducible and limited left hip flexion internal rotation over 5 degrees with external rotation closer to 30 degrees Mild tenderness around the left hip No significant external rotation contracture with active hip flexion Neurovascular intact distally   Calves soft and nontender. Motor function intact in LE. Strength 5/5 LE bilaterally. Neuro: Distal pulses 2+. Sensation to light touch intact in LE.  Vital signs in last 24 hours:    Labs:   Estimated body mass index is 19.26 kg/m as calculated from the following:   Height as of 09/19/22: 5' 2.5" (1.588 m).   Weight as of 09/19/22: 48.5 kg.   Imaging Review Plain radiographs demonstrate severe degenerative joint  disease of the left hip(s). The bone quality appears to be adequate for age and reported activity level.      Assessment/Plan:  End stage arthritis, left hip(s)  The patient history, physical examination, clinical judgement of the provider and imaging studies are consistent with end stage degenerative joint disease of the left hip(s) and total hip arthroplasty is deemed medically necessary. The treatment options including medical management, injection therapy, arthroscopy and arthroplasty were discussed at length. The risks and benefits of total hip arthroplasty were presented and reviewed. The risks due to aseptic loosening, infection, stiffness, dislocation/subluxation,  thromboembolic complications and other imponderables were discussed.  The patient acknowledged the explanation, agreed to proceed with the plan and consent was signed. Patient is being admitted for inpatient treatment for surgery, pain control, PT, OT, prophylactic antibiotics, VTE prophylaxis, progressive  ambulation and ADL's and discharge planning.The patient is planning to be discharged  home.  Therapy Plans: HEP Disposition: Home with daughter (x1 week) Planned DVT Prophylaxis: aspirin '81mg'$  BID DME needed: walker PCP: Dr. Meyer Russel, clearance received TXA: IV Allergies: Advil - abdominal pain, amoxicillin - abominal pain, PCN Anesthesia Concerns: none BMI: 19.3 Last HgbA1c: Not diabetic   Other: - Late husband was pediatric cardiologist - tylenol, tramadol, methocarbamol ( VERY MINIMAL MEDS) - Daughter is a geriatric social worker  Costella Hatcher, PA-C Orthopedic Surgery [facility] [place] [phone]

## 2022-10-02 ENCOUNTER — Ambulatory Visit (HOSPITAL_COMMUNITY): Payer: Medicare HMO | Admitting: Physician Assistant

## 2022-10-02 ENCOUNTER — Ambulatory Visit (HOSPITAL_COMMUNITY): Payer: Medicare HMO

## 2022-10-02 ENCOUNTER — Other Ambulatory Visit: Payer: Self-pay

## 2022-10-02 ENCOUNTER — Observation Stay (HOSPITAL_COMMUNITY): Payer: Medicare HMO

## 2022-10-02 ENCOUNTER — Ambulatory Visit (HOSPITAL_BASED_OUTPATIENT_CLINIC_OR_DEPARTMENT_OTHER): Payer: Medicare HMO | Admitting: Anesthesiology

## 2022-10-02 ENCOUNTER — Encounter (HOSPITAL_COMMUNITY): Payer: Self-pay | Admitting: Orthopedic Surgery

## 2022-10-02 ENCOUNTER — Observation Stay (HOSPITAL_COMMUNITY)
Admission: RE | Admit: 2022-10-02 | Discharge: 2022-10-03 | Disposition: A | Discharge status: Home / Self Care | Payer: Medicare HMO | Attending: Orthopedic Surgery | Admitting: Orthopedic Surgery

## 2022-10-02 ENCOUNTER — Encounter (HOSPITAL_COMMUNITY)
Admission: RE | Disposition: A | Discharge status: Home / Self Care | Payer: Self-pay | Source: Home / Self Care | Attending: Orthopedic Surgery

## 2022-10-02 DIAGNOSIS — Z79899 Other long term (current) drug therapy: Secondary | ICD-10-CM | POA: Insufficient documentation

## 2022-10-02 DIAGNOSIS — Z96642 Presence of left artificial hip joint: Secondary | ICD-10-CM

## 2022-10-02 DIAGNOSIS — M1612 Unilateral primary osteoarthritis, left hip: Principal | ICD-10-CM | POA: Insufficient documentation

## 2022-10-02 DIAGNOSIS — I080 Rheumatic disorders of both mitral and aortic valves: Secondary | ICD-10-CM | POA: Diagnosis not present

## 2022-10-02 DIAGNOSIS — E039 Hypothyroidism, unspecified: Secondary | ICD-10-CM | POA: Diagnosis not present

## 2022-10-02 DIAGNOSIS — Z471 Aftercare following joint replacement surgery: Secondary | ICD-10-CM | POA: Diagnosis not present

## 2022-10-02 DIAGNOSIS — Z01818 Encounter for other preprocedural examination: Secondary | ICD-10-CM

## 2022-10-02 DIAGNOSIS — F419 Anxiety disorder, unspecified: Secondary | ICD-10-CM

## 2022-10-02 HISTORY — PX: TOTAL HIP ARTHROPLASTY: SHX124

## 2022-10-02 LAB — ABO/RH: ABO/RH(D): B POS

## 2022-10-02 LAB — GLUCOSE, CAPILLARY: Glucose-Capillary: 122 mg/dL — ABNORMAL HIGH (ref 70–99)

## 2022-10-02 SURGERY — ARTHROPLASTY, HIP, TOTAL, ANTERIOR APPROACH
Anesthesia: Spinal | Site: Hip | Laterality: Left

## 2022-10-02 MED ORDER — BISACODYL 10 MG RE SUPP: 10.0000 mg | Freq: Every day | RECTAL | Status: DC | PRN

## 2022-10-02 MED ORDER — ASPIRIN 81 MG PO CHEW
81.0000 mg | CHEWABLE_TABLET | Freq: Two times a day (BID) | ORAL | Status: DC
  Administered 2022-10-02 – 2022-10-03 (×2): 81 mg via ORAL
  Filled 2022-10-02 (×2): qty 1

## 2022-10-02 MED ORDER — TRAMADOL HCL 50 MG PO TABS
ORAL_TABLET | ORAL | Status: AC
  Filled 2022-10-02: qty 1

## 2022-10-02 MED ORDER — 0.9 % SODIUM CHLORIDE (POUR BTL) OPTIME
TOPICAL | Status: DC | PRN
  Administered 2022-10-02: 1000 mL

## 2022-10-02 MED ORDER — DEXAMETHASONE SODIUM PHOSPHATE 10 MG/ML IJ SOLN
8.0000 mg | Freq: Once | INTRAMUSCULAR | Status: AC
  Administered 2022-10-02: 8 mg via INTRAVENOUS

## 2022-10-02 MED ORDER — LACTATED RINGERS IV SOLN: INTRAVENOUS | Status: DC

## 2022-10-02 MED ORDER — TRANEXAMIC ACID-NACL 1000-0.7 MG/100ML-% IV SOLN
1000.0000 mg | INTRAVENOUS | Status: AC
  Administered 2022-10-02: 1000 mg via INTRAVENOUS
  Filled 2022-10-02: qty 100

## 2022-10-02 MED ORDER — ACETAMINOPHEN 500 MG PO TABS
1000.0000 mg | ORAL_TABLET | Freq: Once | ORAL | Status: AC
  Administered 2022-10-02: 1000 mg via ORAL
  Filled 2022-10-02: qty 2

## 2022-10-02 MED ORDER — BUPIVACAINE HCL (PF) 0.5 % IJ SOLN
INTRAMUSCULAR | Status: DC | PRN
  Administered 2022-10-02: 30 mL

## 2022-10-02 MED ORDER — LEVOTHYROXINE SODIUM 75 MCG PO TABS: 75.0000 ug | ORAL_TABLET | Freq: Every day | ORAL | Status: DC

## 2022-10-02 MED ORDER — LEVOTHYROXINE SODIUM 50 MCG PO TABS: 50.0000 ug | ORAL_TABLET | ORAL | Status: DC

## 2022-10-02 MED ORDER — SODIUM CHLORIDE (PF) 0.9 % IJ SOLN
INTRAMUSCULAR | Status: DC | PRN
  Administered 2022-10-02: 30 mL

## 2022-10-02 MED ORDER — METHOCARBAMOL 500 MG IVPB - SIMPLE MED
INTRAVENOUS | Status: AC
  Filled 2022-10-02: qty 55

## 2022-10-02 MED ORDER — FENTANYL CITRATE (PF) 100 MCG/2ML IJ SOLN
INTRAMUSCULAR | Status: AC
  Filled 2022-10-02: qty 2

## 2022-10-02 MED ORDER — CLONAZEPAM 0.5 MG PO TABS
0.5000 mg | ORAL_TABLET | Freq: Every evening | ORAL | Status: DC | PRN
  Administered 2022-10-02: 0.5 mg via ORAL
  Filled 2022-10-02: qty 1

## 2022-10-02 MED ORDER — BUPIVACAINE HCL (PF) 0.5 % IJ SOLN
INTRAMUSCULAR | Status: AC
  Filled 2022-10-02: qty 30

## 2022-10-02 MED ORDER — ONDANSETRON HCL 4 MG/2ML IJ SOLN: 4.0000 mg | Freq: Once | INTRAMUSCULAR | Status: DC | PRN

## 2022-10-02 MED ORDER — KETOROLAC TROMETHAMINE 30 MG/ML IJ SOLN
INTRAMUSCULAR | Status: DC | PRN
  Administered 2022-10-02: 30 mg

## 2022-10-02 MED ORDER — LEVOTHYROXINE SODIUM 75 MCG PO TABS
75.0000 ug | ORAL_TABLET | ORAL | Status: DC
  Administered 2022-10-03: 75 ug via ORAL
  Filled 2022-10-02: qty 1

## 2022-10-02 MED ORDER — TRANEXAMIC ACID-NACL 1000-0.7 MG/100ML-% IV SOLN
1000.0000 mg | Freq: Once | INTRAVENOUS | Status: AC
  Administered 2022-10-02: 1000 mg via INTRAVENOUS
  Filled 2022-10-02: qty 100

## 2022-10-02 MED ORDER — METHOCARBAMOL 500 MG IVPB - SIMPLE MED
500.0000 mg | Freq: Four times a day (QID) | INTRAVENOUS | Status: DC | PRN
  Administered 2022-10-02: 500 mg via INTRAVENOUS

## 2022-10-02 MED ORDER — MORPHINE SULFATE (PF) 2 MG/ML IV SOLN: 0.5000 mg | INTRAVENOUS | Status: DC | PRN

## 2022-10-02 MED ORDER — ORAL CARE MOUTH RINSE: 15.0000 mL | Freq: Once | OROMUCOSAL | Status: AC

## 2022-10-02 MED ORDER — DOCUSATE SODIUM 100 MG PO CAPS
100.0000 mg | ORAL_CAPSULE | Freq: Two times a day (BID) | ORAL | Status: DC
  Administered 2022-10-02 – 2022-10-03 (×2): 100 mg via ORAL
  Filled 2022-10-02 (×2): qty 1

## 2022-10-02 MED ORDER — DEXAMETHASONE SODIUM PHOSPHATE 10 MG/ML IJ SOLN
INTRAMUSCULAR | Status: AC
  Filled 2022-10-02: qty 1

## 2022-10-02 MED ORDER — POVIDONE-IODINE 10 % EX SWAB
2.0000 | Freq: Once | CUTANEOUS | Status: AC
  Administered 2022-10-02: 2 via TOPICAL

## 2022-10-02 MED ORDER — FENTANYL CITRATE PF 50 MCG/ML IJ SOSY: 25.0000 ug | PREFILLED_SYRINGE | INTRAMUSCULAR | Status: DC | PRN

## 2022-10-02 MED ORDER — POLYETHYLENE GLYCOL 3350 17 G PO PACK
17.0000 g | PACK | Freq: Two times a day (BID) | ORAL | Status: DC
  Administered 2022-10-03: 17 g via ORAL
  Filled 2022-10-02: qty 1

## 2022-10-02 MED ORDER — ACETAMINOPHEN 500 MG PO TABS
1000.0000 mg | ORAL_TABLET | Freq: Four times a day (QID) | ORAL | Status: DC
  Administered 2022-10-02 – 2022-10-03 (×4): 1000 mg via ORAL
  Filled 2022-10-02 (×4): qty 2

## 2022-10-02 MED ORDER — STERILE WATER FOR IRRIGATION IR SOLN
Status: DC | PRN
  Administered 2022-10-02: 2000 mL

## 2022-10-02 MED ORDER — EPHEDRINE 5 MG/ML INJ
INTRAVENOUS | Status: AC
  Filled 2022-10-02: qty 5

## 2022-10-02 MED ORDER — METOCLOPRAMIDE HCL 5 MG PO TABS
5.0000 mg | ORAL_TABLET | Freq: Three times a day (TID) | ORAL | Status: DC | PRN
  Filled 2022-10-02: qty 2

## 2022-10-02 MED ORDER — BUPIVACAINE IN DEXTROSE 0.75-8.25 % IT SOLN
INTRATHECAL | Status: DC | PRN
  Administered 2022-10-02: 1.6 mL via INTRATHECAL

## 2022-10-02 MED ORDER — PROPOFOL 10 MG/ML IV BOLUS
INTRAVENOUS | Status: DC | PRN
  Administered 2022-10-02 (×2): 30 mg via INTRAVENOUS

## 2022-10-02 MED ORDER — ONDANSETRON HCL 4 MG/2ML IJ SOLN
INTRAMUSCULAR | Status: AC
  Filled 2022-10-02: qty 2

## 2022-10-02 MED ORDER — DEXAMETHASONE SODIUM PHOSPHATE 10 MG/ML IJ SOLN
10.0000 mg | Freq: Once | INTRAMUSCULAR | Status: AC
  Administered 2022-10-03: 10 mg via INTRAVENOUS
  Filled 2022-10-02: qty 1

## 2022-10-02 MED ORDER — EPHEDRINE SULFATE-NACL 50-0.9 MG/10ML-% IV SOSY
PREFILLED_SYRINGE | INTRAVENOUS | Status: DC | PRN
  Administered 2022-10-02: 10 mg via INTRAVENOUS

## 2022-10-02 MED ORDER — SODIUM CHLORIDE 0.9 % IV BOLUS
500.0000 mL | Freq: Once | INTRAVENOUS | Status: AC | PRN
  Administered 2022-10-02: 500 mL via INTRAVENOUS

## 2022-10-02 MED ORDER — ONDANSETRON HCL 4 MG/2ML IJ SOLN
INTRAMUSCULAR | Status: DC | PRN
  Administered 2022-10-02: 4 mg via INTRAVENOUS

## 2022-10-02 MED ORDER — KETOROLAC TROMETHAMINE 30 MG/ML IJ SOLN
INTRAMUSCULAR | Status: AC
  Filled 2022-10-02: qty 1

## 2022-10-02 MED ORDER — METHOCARBAMOL 500 MG PO TABS: 500.0000 mg | ORAL_TABLET | Freq: Four times a day (QID) | ORAL | Status: DC | PRN

## 2022-10-02 MED ORDER — MENTHOL 3 MG MT LOZG: 1.0000 | LOZENGE | OROMUCOSAL | Status: DC | PRN

## 2022-10-02 MED ORDER — ONDANSETRON HCL 4 MG/2ML IJ SOLN
4.0000 mg | Freq: Four times a day (QID) | INTRAMUSCULAR | Status: DC | PRN
  Administered 2022-10-02 (×2): 4 mg via INTRAVENOUS
  Filled 2022-10-02 (×2): qty 2

## 2022-10-02 MED ORDER — TRAMADOL HCL 50 MG PO TABS
50.0000 mg | ORAL_TABLET | Freq: Four times a day (QID) | ORAL | Status: DC | PRN
  Administered 2022-10-02 (×2): 50 mg via ORAL
  Filled 2022-10-02: qty 1

## 2022-10-02 MED ORDER — METOCLOPRAMIDE HCL 5 MG/ML IJ SOLN: 5.0000 mg | Freq: Three times a day (TID) | INTRAMUSCULAR | Status: DC | PRN

## 2022-10-02 MED ORDER — SODIUM CHLORIDE (PF) 0.9 % IJ SOLN
INTRAMUSCULAR | Status: AC
  Filled 2022-10-02: qty 30

## 2022-10-02 MED ORDER — CEFAZOLIN SODIUM-DEXTROSE 2-4 GM/100ML-% IV SOLN
2.0000 g | INTRAVENOUS | Status: AC
  Administered 2022-10-02: 2 g via INTRAVENOUS
  Filled 2022-10-02: qty 100

## 2022-10-02 MED ORDER — PHENYLEPHRINE HCL-NACL 20-0.9 MG/250ML-% IV SOLN
INTRAVENOUS | Status: AC
  Filled 2022-10-02: qty 250

## 2022-10-02 MED ORDER — DIPHENHYDRAMINE HCL 12.5 MG/5ML PO ELIX: 12.5000 mg | ORAL_SOLUTION | ORAL | Status: DC | PRN

## 2022-10-02 MED ORDER — FENTANYL CITRATE (PF) 100 MCG/2ML IJ SOLN
INTRAMUSCULAR | Status: DC | PRN
  Administered 2022-10-02: 25 ug via INTRAVENOUS

## 2022-10-02 MED ORDER — CEFAZOLIN SODIUM-DEXTROSE 2-4 GM/100ML-% IV SOLN
2.0000 g | Freq: Four times a day (QID) | INTRAVENOUS | Status: AC
  Administered 2022-10-02 (×2): 2 g via INTRAVENOUS
  Filled 2022-10-02 (×2): qty 100

## 2022-10-02 MED ORDER — PHENOL 1.4 % MT LIQD: 1.0000 | OROMUCOSAL | Status: DC | PRN

## 2022-10-02 MED ORDER — SODIUM CHLORIDE 0.9 % IV SOLN: INTRAVENOUS | Status: DC

## 2022-10-02 MED ORDER — PROPOFOL 1000 MG/100ML IV EMUL
INTRAVENOUS | Status: AC
  Filled 2022-10-02: qty 100

## 2022-10-02 MED ORDER — PROPOFOL 500 MG/50ML IV EMUL
INTRAVENOUS | Status: DC | PRN
  Administered 2022-10-02: 75 ug/kg/min via INTRAVENOUS

## 2022-10-02 MED ORDER — ONDANSETRON HCL 4 MG PO TABS
4.0000 mg | ORAL_TABLET | Freq: Four times a day (QID) | ORAL | Status: DC | PRN
  Filled 2022-10-02: qty 1

## 2022-10-02 MED ORDER — CHLORHEXIDINE GLUCONATE 0.12 % MT SOLN
15.0000 mL | Freq: Once | OROMUCOSAL | Status: AC
  Administered 2022-10-02: 15 mL via OROMUCOSAL

## 2022-10-02 SURGICAL SUPPLY — 40 items
BAG COUNTER SPONGE SURGICOUNT (BAG) IMPLANT
BAG DECANTER FOR FLEXI CONT (MISCELLANEOUS) IMPLANT
BAG ZIPLOCK 12X15 (MISCELLANEOUS) IMPLANT
BLADE SAG 18X100X1.27 (BLADE) ×1 IMPLANT
COVER PERINEAL POST (MISCELLANEOUS) ×1 IMPLANT
COVER SURGICAL LIGHT HANDLE (MISCELLANEOUS) ×1 IMPLANT
CUP ACETBLR 52 OD PINNACLE (Hips) IMPLANT
DERMABOND ADVANCED .7 DNX12 (GAUZE/BANDAGES/DRESSINGS) ×1 IMPLANT
DRAPE FOOT SWITCH (DRAPES) ×1 IMPLANT
DRAPE STERI IOBAN 125X83 (DRAPES) ×1 IMPLANT
DRAPE U-SHAPE 47X51 STRL (DRAPES) ×2 IMPLANT
DRESSING AQUACEL AG SP 3.5X10 (GAUZE/BANDAGES/DRESSINGS) ×1 IMPLANT
DRSG AQUACEL AG ADV 3.5X10 (GAUZE/BANDAGES/DRESSINGS) IMPLANT
DRSG AQUACEL AG SP 3.5X10 (GAUZE/BANDAGES/DRESSINGS) ×1
DURAPREP 26ML APPLICATOR (WOUND CARE) ×1 IMPLANT
ELECT REM PT RETURN 15FT ADLT (MISCELLANEOUS) ×1 IMPLANT
GLOVE BIO SURGEON STRL SZ 6 (GLOVE) ×1 IMPLANT
GLOVE BIOGEL PI IND STRL 6.5 (GLOVE) ×1 IMPLANT
GLOVE BIOGEL PI IND STRL 7.5 (GLOVE) ×1 IMPLANT
GLOVE ORTHO TXT STRL SZ7.5 (GLOVE) ×2 IMPLANT
GOWN STRL REUS W/ TWL LRG LVL3 (GOWN DISPOSABLE) ×2 IMPLANT
GOWN STRL REUS W/TWL LRG LVL3 (GOWN DISPOSABLE) ×2
HEAD M SROM 36MM PLUS 1.5 (Hips) IMPLANT
HOLDER FOLEY CATH W/STRAP (MISCELLANEOUS) ×1 IMPLANT
KIT TURNOVER KIT A (KITS) IMPLANT
LINER NEUTRAL 52X36MM PLUS 4 (Liner) IMPLANT
PACK ANTERIOR HIP CUSTOM (KITS) ×1 IMPLANT
SCREW 6.5MMX30MM (Screw) IMPLANT
SROM M HEAD 36MM PLUS 1.5 (Hips) ×1 IMPLANT
STEM FEMORAL SZ6 HIGH ACTIS (Stem) IMPLANT
SUT MNCRL AB 4-0 PS2 18 (SUTURE) ×1 IMPLANT
SUT STRATAFIX 0 PDS 27 VIOLET (SUTURE) ×1
SUT VIC AB 1 CT1 36 (SUTURE) ×3 IMPLANT
SUT VIC AB 2-0 CT1 27 (SUTURE) ×2
SUT VIC AB 2-0 CT1 TAPERPNT 27 (SUTURE) ×2 IMPLANT
SUTURE STRATFX 0 PDS 27 VIOLET (SUTURE) ×1 IMPLANT
TRAY FOLEY MTR SLVR 16FR STAT (SET/KITS/TRAYS/PACK) IMPLANT
TRAY FOLEY W/BAG SLVR 14FR LF (SET/KITS/TRAYS/PACK) IMPLANT
TUBE SUCTION HIGH CAP CLEAR NV (SUCTIONS) ×1 IMPLANT
WATER STERILE IRR 1000ML POUR (IV SOLUTION) ×1 IMPLANT

## 2022-10-02 NOTE — Transfer of Care (Signed)
Immediate Anesthesia Transfer of Care Note  Patient: Glenda Perez  Procedure(s) Performed: TOTAL HIP ARTHROPLASTY ANTERIOR APPROACH (Left: Hip)  Patient Location: PACU  Anesthesia Type:Spinal  Level of Consciousness: awake, alert , and oriented  Airway & Oxygen Therapy: Patient Spontanous Breathing and Patient connected to face mask oxygen  Post-op Assessment: Report given to RN and Post -op Vital signs reviewed and stable  Post vital signs: Reviewed and stable  Last Vitals:  Vitals Value Taken Time  BP 122/60 10/02/22 1232  Temp    Pulse 66 10/02/22 1234  Resp 18 10/02/22 1234  SpO2 100 % 10/02/22 1234  Vitals shown include unvalidated device data.  Last Pain:  Vitals:   10/02/22 0939  TempSrc:   PainSc: 2          Complications: No notable events documented.

## 2022-10-02 NOTE — Discharge Instructions (Signed)

## 2022-10-02 NOTE — Evaluation (Signed)
Physical Therapy Evaluation Patient Details Name: Glenda Perez MRN: [id number] DOB: 1940/12/17 Today's Date: 10/02/2022  History of Present Illness  Pt is an 81yo female presenting s/p L-THA, AA on 10/02/22. PMH: osteopenia.  Clinical Impression  Glenda Perez is a 81 y.o. female POD 0 s/p L-THA, AA. Patient reports independence with mobility at baseline. Patient is now limited by functional impairments (see PT problem list below) and requires min assist for bed mobility and min guard for transfers; orthostatic vitals taken and were negative (see table below). Patient was able to ambulate 40 feet with RW and min guard level of assist. Patient instructed in exercise to facilitate ROM and circulation to manage edema. Provided incentive spirometer and with Vcs pt able to achieve 1070m. Patient will benefit from continued skilled PT interventions to address impairments and progress towards PLOF. Acute PT will follow to progress mobility and stair training in preparation for safe discharge home.   Orthostatic BPs  10/02/22 1805  Orthostatic Lying   BP- Lying 138/67  Pulse- Lying 81  Orthostatic Sitting  BP- Sitting 138/65  Pulse- Sitting 87  Orthostatic Standing at 0 minutes  BP- Standing at 0 minutes 141/72  Pulse- Standing at 0 minutes 93  Orthostatic Standing at 3 minutes  BP- Standing at 3 minutes 138/72  Pulse- Standing at 3 minutes 96        Recommendations for follow up therapy are one component of a multi-disciplinary discharge planning process, led by the attending physician.  Recommendations may be updated based on patient status, additional functional criteria and insurance authorization.  Follow Up Recommendations Follow physician's recommendations for discharge plan and follow up therapies      Assistance Recommended at Discharge Frequent or constant Supervision/Assistance  Patient can return home with the following  A little help with walking and/or transfers;A  little help with bathing/dressing/bathroom;Assistance with cooking/housework;Assist for transportation;Help with stairs or ramp for entrance    Equipment Recommendations Rolling walker (2 wheels)  Recommendations for Other Services       Functional Status Assessment Patient has had a recent decline in their functional status and demonstrates the ability to make significant improvements in function in a reasonable and predictable amount of time.     Precautions / Restrictions Precautions Precautions: Fall Precaution Comments: watch BP Restrictions Weight Bearing Restrictions: No Other Position/Activity Restrictions: wbat      Mobility  Bed Mobility Overal bed mobility: Needs Assistance Bed Mobility: Supine to Sit     Supine to sit: Min assist, HOB elevated     General bed mobility comments: min assist to bring LLE off bed.    Transfers Overall transfer level: Needs assistance Equipment used: Rolling walker (2 wheels) Transfers: Sit to/from Stand Sit to Stand: Min guard, From elevated surface           General transfer comment: For safety only, Vcs for sequencing. Pt able to stand with single BUE support for orthostatic vitals.    Ambulation/Gait Ambulation/Gait assistance: Min guard, +2 safety/equipment Gait Distance (Feet): 40 Feet Assistive device: Rolling walker (2 wheels) Gait Pattern/deviations: Step-to pattern Gait velocity: decreased     General Gait Details: Pt ambulated with RW and min guard, +2 for recliner follow only, no physical assist required or overt LOB noted. No symptoms during ambulation.  Stairs            Wheelchair Mobility    Modified Rankin (Stroke Patients Only)       Balance Overall balance assessment: Needs assistance Sitting-balance support:  Feet supported, No upper extremity supported Sitting balance-Leahy Scale: Good     Standing balance support: Bilateral upper extremity supported, During functional activity,  Single extremity supported Standing balance-Leahy Scale: Fair Standing balance comment: Able to stand statically with single UE support, required BUE supoprt on RW during dynamic mobility                             Pertinent Vitals/Pain Pain Assessment Pain Assessment: 0-10 Pain Score: 3  Pain Location: Left hip and low back Pain Descriptors / Indicators: Operative site guarding Pain Intervention(s): Limited activity within patient's tolerance, Monitored during session, Repositioned, Ice applied    Home Living Family/patient expects to be discharged to:: Private residence Living Arrangements: Alone Available Help at Discharge: Family;Available 24 hours/day (Daughter for one week) Type of Home: House Home Access: Stairs to enter Entrance Stairs-Rails: Left (3 steps from entrance to the main level where bed/bath/kitchen are located.) Entrance Stairs-Number of Steps: 7     Home Equipment: Toilet riser;Grab bars - tub/shower;Grab bars - toilet;Cane - single point      Prior Function Prior Level of Function : Independent/Modified Independent             Mobility Comments: IND ADLs Comments: IND     Hand Dominance        Extremity/Trunk Assessment   Upper Extremity Assessment Upper Extremity Assessment: Overall WFL for tasks assessed    Lower Extremity Assessment Lower Extremity Assessment: RLE deficits/detail;LLE deficits/detail RLE Deficits / Details: MMT ank DF/PF 5/5 RLE Sensation: WNL LLE Deficits / Details: MMT ank DF/PF 5/5 LLE Sensation: WNL    Cervical / Trunk Assessment Cervical / Trunk Assessment: Kyphotic  Communication   Communication: No difficulties  Cognition Arousal/Alertness: Awake/alert Behavior During Therapy: WFL for tasks assessed/performed Overall Cognitive Status: Within Functional Limits for tasks assessed                                          General Comments General comments (skin integrity, edema,  etc.): daughter [name] present    Exercises Total Joint Exercises Ankle Circles/Pumps: AROM, Both, 20 reps   Assessment/Plan    PT Assessment Patient needs continued PT services  PT Problem List Decreased strength;Decreased range of motion;Decreased activity tolerance;Decreased balance;Decreased mobility;Decreased coordination;Pain       PT Treatment Interventions DME instruction;Gait training;Stair training;Functional mobility training;Therapeutic activities;Therapeutic exercise;Balance training;Neuromuscular re-education;Patient/family education    PT Goals (Current goals can be found in the Care Plan section)  Acute Rehab PT Goals Patient Stated Goal: to dance again PT Goal Formulation: With patient Time For Goal Achievement: 10/09/22 Potential to Achieve Goals: Good    Frequency 7X/week     Co-evaluation               AM-PAC PT "6 Clicks" Mobility  Outcome Measure Help needed turning from your back to your side while in a flat bed without using bedrails?: None Help needed moving from lying on your back to sitting on the side of a flat bed without using bedrails?: A Little Help needed moving to and from a bed to a chair (including a wheelchair)?: A Little Help needed standing up from a chair using your arms (e.g., wheelchair or bedside chair)?: A Little Help needed to walk in hospital room?: A Little Help needed climbing 3-5 steps with a railing? :  A Little 6 Click Score: 19    End of Session Equipment Utilized During Treatment: Gait belt Activity Tolerance: Patient tolerated treatment well;No increased pain Patient left: in chair;with call bell/phone within reach;with chair alarm set;with family/visitor present;with SCD's reapplied Nurse Communication: Mobility status PT Visit Diagnosis: Pain;Difficulty in walking, not elsewhere classified (R26.2) Pain - Right/Left: Left Pain - part of body: Hip    Time: 5973-3125 PT Time Calculation (min) (ACUTE ONLY): 32  min   Charges:   PT Evaluation $PT Eval Low Complexity: 1 Low PT Treatments $Gait Training: 8-22 mins        Coolidge Breeze, PT, DPT [facility] Office: [phone] Weekend pager: [phone]  Coolidge Breeze 10/02/2022, 7:19 PM

## 2022-10-02 NOTE — Anesthesia Preprocedure Evaluation (Addendum)
Anesthesia Evaluation  Patient identified by MRN, date of birth, ID band Patient awake    Reviewed: Allergy & Precautions, NPO status , Patient's Chart, lab work & pertinent test results  Airway Mallampati: II  TM Distance: >3 FB Neck ROM: Full    Dental  (+) Teeth Intact, Dental Advisory Given   Pulmonary neg pulmonary ROS   Pulmonary exam normal breath sounds clear to auscultation       Cardiovascular + Valvular Problems/Murmurs AI and MR  Rhythm:Regular Rate:Normal + Diastolic murmurs Echo 03/22/08: 1. Left ventricular ejection fraction, by estimation, is 60 to 65%. The  left ventricle has normal function. The left ventricle has no regional  wall motion abnormalities. There is mild left ventricular hypertrophy.  Left ventricular diastolic parameters  are consistent with Grade I diastolic dysfunction (impaired relaxation).   2. Right ventricular systolic function is normal. The right ventricular  size is normal. Tricuspid regurgitation signal is inadequate for assessing  PA pressure.   3. The mitral valve is grossly normal. Trivial mitral valve  regurgitation.   4. The aortic valve is tricuspid. Aortic valve regurgitation is mild to  moderate. Mild aortic valve sclerosis is present, with no evidence of  aortic valve stenosis.   5. The inferior vena cava is normal in size with <50% respiratory  variability, suggesting right atrial pressure of 8 mmHg.   6. No obvious PFO by color doppler.     Neuro/Psych  PSYCHIATRIC DISORDERS Anxiety      Neuromuscular disease    GI/Hepatic negative GI ROS, Neg liver ROS,,,  Endo/Other  Hypothyroidism    Renal/GU negative Renal ROS     Musculoskeletal  (+) Arthritis , Osteoarthritis,    Abdominal   Peds  Hematology negative hematology ROS (+) Plt 221k    Anesthesia Other Findings Day of surgery medications reviewed with the patient.  Reproductive/Obstetrics                               Anesthesia Physical Anesthesia Plan  ASA: 3  Anesthesia Plan: Spinal   Post-op Pain Management: Tylenol PO (pre-op)*   Induction: Intravenous  PONV Risk Score and Plan: 2 and TIVA, Dexamethasone and Ondansetron  Airway Management Planned: Natural Airway and Simple Face Mask  Additional Equipment:   Intra-op Plan:   Post-operative Plan:   Informed Consent: I have reviewed the patients History and Physical, chart, labs and discussed the procedure including the risks, benefits and alternatives for the proposed anesthesia with the patient or authorized representative who has indicated his/her understanding and acceptance.     Dental advisory given  Plan Discussed with: CRNA, Anesthesiologist and Surgeon  Anesthesia Plan Comments: (Discussed risks and benefits of and differences between spinal and general. Discussed risks of spinal including headache, backache, failure, bleeding, infection, and nerve damage. Patient consents to spinal. Questions answered. Coagulation studies and platelet count acceptable.)         Anesthesia Quick Evaluation

## 2022-10-02 NOTE — Anesthesia Procedure Notes (Signed)
Procedure Name: MAC Date/Time: 10/02/2022 10:52 AM  Performed by: Talbot Grumbling, CRNAPre-anesthesia Checklist: Patient identified, Emergency Drugs available, Suction available and Patient being monitored Oxygen Delivery Method: Simple face mask

## 2022-10-02 NOTE — Interval H&P Note (Signed)
History and Physical Interval Note:  10/02/2022 9:34 AM  Barbette Hair  has presented today for surgery, with the diagnosis of Left hip osteoarthritis.  The various methods of treatment have been discussed with the patient and family. After consideration of risks, benefits and other options for treatment, the patient has consented to  Procedure(s): TOTAL HIP ARTHROPLASTY ANTERIOR APPROACH (Left) as a surgical intervention.  The patient's history has been reviewed, patient examined, no change in status, stable for surgery.  I have reviewed the patient's chart and labs.  Questions were answered to the patient's satisfaction.     Mauri Pole

## 2022-10-02 NOTE — Plan of Care (Signed)
  Problem: Education: Goal: Knowledge of the prescribed therapeutic regimen will improve Outcome: Progressing   Problem: Activity: Goal: Ability to tolerate increased activity will improve Outcome: Progressing   Problem: Pain Management: Goal: Pain level will decrease with appropriate interventions Outcome: Progressing   Problem: Safety: Goal: Ability to remain free from injury will improve Outcome: Progressing

## 2022-10-02 NOTE — Op Note (Signed)
NAME:  Glenda Perez                ACCOUNT NO.: [id number]      MEDICAL RECORD NO.: [id number]      FACILITY:  [facility]      PHYSICIAN:  Mauri Pole  DATE OF BIRTH:  1940-11-16     DATE OF PROCEDURE:  10/02/2022                                 OPERATIVE REPORT         PREOPERATIVE DIAGNOSIS: Left  hip osteoarthritis.      POSTOPERATIVE DIAGNOSIS:  Left hip osteoarthritis.      PROCEDURE:  Left total hip replacement through an anterior approach   utilizing DePuy THR system, component size 52 mm pinnacle cup, a size 36+4 neutral   Altrex liner, a size 6 Hi Actis stem with a 36+1.5 Articuleze metal head ball.      SURGEON:  Pietro Cassis. Alvan Dame, M.D.      ASSISTANT:  Costella Hatcher, PA-C     ANESTHESIA:  Spinal.      SPECIMENS:  None.      COMPLICATIONS:  None.      BLOOD LOSS:  450 cc     DRAINS:  None.      INDICATION OF THE PROCEDURE:  Glenda Perez is a 81 y.o. female who had   presented to office for evaluation of left hip pain.  Radiographs revealed   progressive degenerative changes with bone-on-bone   articulation of the  hip joint, including subchondral cystic changes and osteophytes.  The patient had painful limited range of   motion significantly affecting their overall quality of life and function.  The patient was failing to    respond to conservative measures including medications and/or injections and activity modification and at this point was ready   to proceed with more definitive measures.  Consent was obtained for   benefit of pain relief.  Specific risks of infection, DVT, component   failure, dislocation, neurovascular injury, and need for revision surgery were reviewed in the office.     PROCEDURE IN DETAIL:  The patient was brought to operative theater.   Once adequate anesthesia, preoperative antibiotics, 2 gm of Ancef, 1 gm of Tranexamic Acid, and 10 mg of Decadron were administered, the patient was positioned supine on  the [employer] table.  Once the patient was safely positioned with adequate padding of boney prominences we predraped out the hip, and used fluoroscopy to confirm orientation of the pelvis.      The left hip was then prepped and draped from proximal iliac crest to   mid thigh with a shower curtain technique.      Time-out was performed identifying the patient, planned procedure, and the appropriate extremity.     An incision was then made 2 cm lateral to the   anterior superior iliac spine extending over the orientation of the   tensor fascia lata muscle and sharp dissection was carried down to the   fascia of the muscle.      The fascia was then incised.  The muscle belly was identified and swept   laterally and retractor placed along the superior neck.  Following   cauterization of the circumflex vessels and removing some pericapsular   fat, a second cobra retractor was placed on the inferior neck.  A  T-capsulotomy was made along the line of the   superior neck to the trochanteric fossa, then extended proximally and   distally.  Tag sutures were placed and the retractors were then placed   intracapsular.  We then identified the trochanteric fossa and   orientation of my neck cut and then made a neck osteotomy with the femur on traction.  The femoral   head was removed without difficulty or complication.  Traction was let   off and retractors were placed posterior and anterior around the   acetabulum.      The labrum and foveal tissue were debrided.  I began reaming with a 45 mm   reamer and reamed up to 51 mm reamer with good bony bed preparation and a 52 mm  cup was chosen.  The final 52 mm Pinnacle cup was then impacted under fluoroscopy to confirm the depth of penetration and orientation with respect to   Abduction and forward flexion.  A screw was placed into the ilium followed by the hole eliminator.  The final   36+4 neutral Altrex liner was impacted with good visualized rim fit.   The cup was positioned anatomically within the acetabular portion of the pelvis.      At this point, the femur was rolled to 100 degrees.  Further capsule was   released off the inferior aspect of the femoral neck.  I then   released the superior capsule proximally.  With the leg in a neutral position the hook was placed laterally   along the femur under the vastus lateralis origin and elevated manually and then held in position using the hook attachment on the bed.  The leg was then extended and adducted with the leg rolled to 100   degrees of external rotation.  Retractors were placed along the medial calcar and posteriorly over the greater trochanter.  Once the proximal femur was fully   exposed, I used a box osteotome to set orientation.  I then began   broaching with the starting chili pepper broach and passed this by hand and then broached up to 6.  With the 6 broach in place I chose a high offset neck and did several trial reductions.  The offset was appropriate, leg lengths   appeared to be equal best matched with the +1.5 head ball trial confirmed radiographically.   Given these findings, I went ahead and dislocated the hip, repositioned all   retractors and positioned the right hip in the extended and abducted position.  The final 6 Hi Actis stem was   chosen and it was impacted down to the level of neck cut.  Based on this   and the trial reductions, a final 36+1.5 Articuleze metal head ball was chosen and   impacted onto a clean and dry trunnion, and the hip was reduced.  The   hip had been irrigated throughout the case again at this point.  I did   reapproximate the superior capsular leaflet to the anterior leaflet   using #1 Vicryl.  The fascia of the   tensor fascia lata muscle was then reapproximated using #1 Vicryl and #0 Stratafix sutures.  The   remaining wound was closed with 2-0 Vicryl and running 4-0 Monocryl.   The hip was cleaned, dried, and dressed sterilely using  Dermabond and   Aquacel dressing.  The patient was then brought   to recovery room in stable condition tolerating the procedure well.    Costella Hatcher, PA-C  was present for the entirety of the case involved from   preoperative positioning, perioperative retractor management, general   facilitation of the case, as well as primary wound closure as assistant.            Pietro Cassis Alvan Dame, M.D.        10/02/2022 9:40 AM

## 2022-10-02 NOTE — Plan of Care (Signed)
  Problem: Activity: Goal: Ability to avoid complications of mobility impairment will improve Outcome: Progressing Goal: Ability to tolerate increased activity will improve Outcome: Progressing   Problem: Pain Management: Goal: Pain level will decrease with appropriate interventions Outcome: Progressing   Problem: Activity: Goal: Risk for activity intolerance will decrease Outcome: Progressing

## 2022-10-02 NOTE — Care Plan (Signed)
Ortho Bundle Case Management Note  Patient Details  Name: Glenda Perez MRN: [id number] Date of Birth: Apr 05, 1941                  L THA on 10/02/22.  DCP: Home with daughter.  DME: RW ordered through [place].  PT: HEP   DME Arranged:  Walker rolling DME Agency:  [employer]    Additional Comments: Please contact me with any questions of if this plan should need to change.  Dario Ave, Case Manager  [facility] [phone] 10/02/2022, 11:15 AM

## 2022-10-02 NOTE — Progress Notes (Signed)
   10/02/22 1507  Assess: MEWS Score  Temp 97.8 F (36.6 C)  BP (!) 56/40  MAP (mmHg) (!) 43  ECG Heart Rate (!) 44  Resp 16  Level of Consciousness Alert  SpO2 93 %  O2 Device Room Air  Assess: if the MEWS score is Yellow or Red  Were vital signs taken at a resting state? Yes  Focused Assessment No change from prior assessment  Does the patient meet 2 or more of the SIRS criteria? No  MEWS guidelines implemented *See Row Information* No, vital signs rechecked  Treat  MEWS Interventions Other (Comment) (Rapid admistered NS bolus)  Pain Scale 0-10  Pain Score 0  Take Vital Signs  Increase Vital Sign Frequency  Yellow: Q 2hr X 2 then Q 4hr X 2, if remains yellow, continue Q 4hrs;Red: Q 1hr X 4 then Q 4hr X 4, if remains red, continue Q 4hrs  Notify: Charge Nurse/RN  Name of Charge Nurse/RN Notified Butler Denmark  Date Charge Nurse/RN Notified 10/02/22  Time Charge Nurse/RN Notified 1510  Provider Notification  Provider Name/Title Dr. Alvan Dame  Date Provider Notified 10/02/22  Time Provider Notified 1510  Method of Notification Call  Notification Reason Change in status  Notify: Rapid Response  Name of Rapid Response RN Notified Zoe, RN  Date Rapid Response Notified 10/02/22  Time Rapid Response Notified 8264  Document  Patient Outcome Stabilized after interventions   Patient arrived from PACU. Alert and oriented x4. Vital signs taken, BP low called PA and rapid response team. New order noted.

## 2022-10-02 NOTE — Anesthesia Procedure Notes (Signed)
Spinal  Patient location during procedure: OR Start time: 10/02/2022 10:56 AM End time: 10/02/2022 10:59 AM Reason for block: surgical anesthesia Staffing Performed: anesthesiologist  Anesthesiologist: Santa Lighter, MD Performed by: Santa Lighter, MD Authorized by: Santa Lighter, MD   Preanesthetic Checklist Completed: patient identified, IV checked, risks and benefits discussed, surgical consent, monitors and equipment checked, pre-op evaluation and timeout performed Spinal Block Patient position: sitting Prep: DuraPrep and site prepped and draped Patient monitoring: continuous pulse ox and blood pressure Approach: midline Location: L3-4 Injection technique: single-shot Needle Needle type: Pencan  Needle gauge: 24 G Assessment Events: CSF return Additional Notes Functioning IV was confirmed and monitors were applied. Sterile prep and drape, including hand hygiene, mask and sterile gloves were used. The patient was positioned and the spine was prepped. The skin was anesthetized with lidocaine.  Free flow of clear CSF was obtained prior to injecting local anesthetic into the CSF.  The spinal needle aspirated freely following injection.  The needle was carefully withdrawn.  The patient tolerated the procedure well. Consent was obtained prior to procedure with all questions answered and concerns addressed. Risks including but not limited to bleeding, infection, nerve damage, paralysis, failed block, inadequate analgesia, allergic reaction, high spinal, itching and headache were discussed and the patient wished to proceed.   Hoy Morn, MD

## 2022-10-02 NOTE — Significant Event (Addendum)
Rapid Response Event Note   Reason for Call :  Hypotension, SBP in 50s  Initial Focused Assessment:  Post-op patient Aox4 in bed, BP 67/36 & HR 45. Per daughter, patient is mentating slower than baseline. EBL 450 mL, tramadol given at 1413 in PACU. Per daughter, patient has had syncopal events at home and both patient and daughter report that patient's fluid intake at home is likely to be inadequate   Interventions:  571m NS bolus  Plan of Care:  BP improved to 115/57 following bolus completion. Floor RN advised to continue regular BP monitoring  Event Summary:   MD Notified: Dr. OAlvan DameCall Time: 17680Arrival Time: 18811End Time: 10315 ZCyndie Chime RN

## 2022-10-02 NOTE — Anesthesia Postprocedure Evaluation (Signed)
Anesthesia Post Note  Patient: Glenda Perez  Procedure(s) Performed: TOTAL HIP ARTHROPLASTY ANTERIOR APPROACH (Left: Hip)     Patient location during evaluation: PACU Anesthesia Type: Spinal Level of consciousness: awake, awake and alert and oriented Pain management: pain level controlled Vital Signs Assessment: post-procedure vital signs reviewed and stable Respiratory status: spontaneous breathing, nonlabored ventilation and respiratory function stable Cardiovascular status: blood pressure returned to baseline and stable Postop Assessment: no headache, no backache, spinal receding and no apparent nausea or vomiting Anesthetic complications: no   No notable events documented.  Last Vitals:  Vitals:   10/02/22 1615 10/02/22 1755  BP: 136/66 (!) 121/55  Pulse: 65 71  Resp: 16 18  Temp: (!) 36.4 C 36.7 C  SpO2: 100% 100%    Last Pain:  Vitals:   10/02/22 1755  TempSrc: Oral  PainSc:                  Santa Lighter

## 2022-10-03 DIAGNOSIS — E039 Hypothyroidism, unspecified: Secondary | ICD-10-CM | POA: Diagnosis not present

## 2022-10-03 DIAGNOSIS — Z79899 Other long term (current) drug therapy: Secondary | ICD-10-CM | POA: Diagnosis not present

## 2022-10-03 DIAGNOSIS — M1612 Unilateral primary osteoarthritis, left hip: Secondary | ICD-10-CM | POA: Diagnosis not present

## 2022-10-03 LAB — CBC
HCT: 29.3 % — ABNORMAL LOW (ref 36.0–46.0)
Hemoglobin: 9.8 g/dL — ABNORMAL LOW (ref 12.0–15.0)
MCH: 33.2 pg (ref 26.0–34.0)
MCHC: 33.4 g/dL (ref 30.0–36.0)
MCV: 99.3 fL (ref 80.0–100.0)
Platelets: 179 10*3/uL (ref 150–400)
RBC: 2.95 MIL/uL — ABNORMAL LOW (ref 3.87–5.11)
RDW: 12.9 % (ref 11.5–15.5)
WBC: 12.1 10*3/uL — ABNORMAL HIGH (ref 4.0–10.5)
nRBC: 0 % (ref 0.0–0.2)

## 2022-10-03 LAB — BASIC METABOLIC PANEL
Anion gap: 8 (ref 5–15)
BUN: 9 mg/dL (ref 8–23)
CO2: 25 mmol/L (ref 22–32)
Calcium: 8.3 mg/dL — ABNORMAL LOW (ref 8.9–10.3)
Chloride: 103 mmol/L (ref 98–111)
Creatinine, Ser: 0.51 mg/dL (ref 0.44–1.00)
GFR, Estimated: >60 mL/min (ref >60)
Glucose, Bld: 138 mg/dL — ABNORMAL HIGH (ref 70–99)
Potassium: 3.3 mmol/L — ABNORMAL LOW (ref 3.5–5.1)
Sodium: 136 mmol/L (ref 135–145)

## 2022-10-03 MED ORDER — POTASSIUM CHLORIDE CRYS ER 20 MEQ PO TBCR
40.0000 meq | EXTENDED_RELEASE_TABLET | Freq: Once | ORAL | Status: AC
  Administered 2022-10-03: 40 meq via ORAL
  Filled 2022-10-03: qty 2

## 2022-10-03 MED ORDER — ASPIRIN 81 MG PO CHEW: 81.0000 mg | CHEWABLE_TABLET | Freq: Two times a day (BID) | ORAL | 0 refills | Status: AC

## 2022-10-03 MED ORDER — POLYETHYLENE GLYCOL 3350 17 G PO PACK: 17.0000 g | PACK | Freq: Every day | ORAL | 0 refills | Status: DC

## 2022-10-03 MED ORDER — TRAMADOL HCL 50 MG PO TABS: 50.0000 mg | ORAL_TABLET | Freq: Four times a day (QID) | ORAL | 0 refills | Status: DC | PRN

## 2022-10-03 MED ORDER — METHOCARBAMOL 500 MG PO TABS: 500.0000 mg | ORAL_TABLET | Freq: Four times a day (QID) | ORAL | 1 refills | Status: DC | PRN

## 2022-10-03 NOTE — Progress Notes (Signed)
   Subjective: 1 Day Post-Op Procedure(s) (LRB): TOTAL HIP ARTHROPLASTY ANTERIOR APPROACH (Left) Patient reports pain as mild.   Patient seen in rounds by Dr. Alvan Dame. Patient is resting in bed on exam this morning. No acute events overnight. She was able to ambulate 40 feet with PT yesterday.  We will continue therapy today.   Objective: Vital signs in last 24 hours: Temp:  [97.5 F (36.4 C)-98 F (36.7 C)] 97.6 F (36.4 C) (12/13 0556) Pulse Rate:  [52-88] 88 (12/13 0556) Resp:  [15-23] 17 (12/13 0556) BP: (56-179)/(39-100) 127/56 (12/13 0556) SpO2:  [93 %-100 %] 99 % (12/13 0556) Weight:  [48.5 kg] 48.5 kg (12/12 0939)  Intake/Output from previous day:  Intake/Output Summary (Last 24 hours) at 10/03/2022 0721 Last data filed at 10/03/2022 0600 Gross per 24 hour  Intake 2878.44 ml  Output 3400 ml  Net -521.56 ml     Intake/Output this shift: No intake/output data recorded.  Labs: Recent Labs    10/03/22 0318  HGB 9.8*   Recent Labs    10/03/22 0318  WBC 12.1*  RBC 2.95*  HCT 29.3*  PLT 179   Recent Labs    10/03/22 0318  NA 136  K 3.3*  CL 103  CO2 25  BUN 9  CREATININE 0.51  GLUCOSE 138*  CALCIUM 8.3*   No results for input(s): "LABPT", "INR" in the last 72 hours.  Exam: General - Patient is Alert and Oriented Extremity - Neurologically intact Sensation intact distally Intact pulses distally Dorsiflexion/Plantar flexion intact Dressing - dressing C/D/I Motor Function - intact, moving foot and toes well on exam.   Past Medical History:  Diagnosis Date   Anxiety    clonezapam   Arthritis    Leaky heart valve    Osteopenia    Reactive hypoglycemia    Thyroid disease     Assessment/Plan: 1 Day Post-Op Procedure(s) (LRB): TOTAL HIP ARTHROPLASTY ANTERIOR APPROACH (Left) Principal Problem:   S/P total left hip arthroplasty  Estimated body mass index is 19.41 kg/m as calculated from the following:   Height as of this encounter: 5'  2.25" (1.581 m).   Weight as of this encounter: 48.5 kg. Advance diet Up with therapy D/C IV fluids  DVT Prophylaxis - Aspirin Weight bearing as tolerated.  ABLA: Hgb stable at 9.8 this AM. BP stable K 3.3 > will give one dose of kdur 40 meq  Plan is to go Home after hospital stay. Plan for discharge today after meeting goals with therapy. Follow up in the office in 2 weeks.   Griffith Citron, PA-C Orthopedic Surgery [phone] 10/03/2022, 7:21 AM

## 2022-10-03 NOTE — Progress Notes (Signed)
Physical Therapy Treatment Patient Details Name: Glenda Perez MRN: [id number] DOB: June 12, 1941 Today's Date: 10/03/2022   History of Present Illness Pt is an 81yo female presenting s/p L-THA, AA on 10/02/22. PMH: osteopenia.    PT Comments    Pt seen POD1 for first of two sessions, resting comfortably supine in bed reporting 3/10 pain. Required supervison for bed mobility and transfers, min guard for ambulation in hallway 120f and stair training. Provided HEP and pt completed supine portion with minimal cuing. Pt is progressing well and expect pt will meet all mobility goals after second session today. We will continue to follow acutely.   Recommendations for follow up therapy are one component of a multi-disciplinary discharge planning process, led by the attending physician.  Recommendations may be updated based on patient status, additional functional criteria and insurance authorization.  Follow Up Recommendations  Follow physician's recommendations for discharge plan and follow up therapies     Assistance Recommended at Discharge Frequent or constant Supervision/Assistance  Patient can return home with the following A little help with walking and/or transfers;A little help with bathing/dressing/bathroom;Assistance with cooking/housework;Assist for transportation;Help with stairs or ramp for entrance   Equipment Recommendations  Rolling walker (2 wheels)    Recommendations for Other Services       Precautions / Restrictions Precautions Precautions: Fall Precaution Comments: watch BP Restrictions Weight Bearing Restrictions: No Other Position/Activity Restrictions: wbat     Mobility  Bed Mobility Overal bed mobility: Needs Assistance Bed Mobility: Supine to Sit, Sit to Supine     Supine to sit: HOB elevated, Supervision Sit to supine: Supervision, HOB elevated   General bed mobility comments: Supervision for safety, no physical assist required, minimal cuing.     Transfers Overall transfer level: Needs assistance Equipment used: Rolling walker (2 wheels) Transfers: Sit to/from Stand Sit to Stand: Supervision           General transfer comment: For safety only, Vcs for sequencing. Pt required multiple attemtps with repeated cuing to place hands appropriately.    Ambulation/Gait Ambulation/Gait assistance: Min guard, +2 safety/equipment Gait Distance (Feet): 180 Feet Assistive device: Rolling walker (2 wheels) Gait Pattern/deviations: Step-to pattern Gait velocity: decreased     General Gait Details: Pt ambulated with RW and min guard no physical assist required or overt LOB noted. Vcs for proximity to device.   Stairs Stairs: Yes Stairs assistance: Min guard Stair Management: One rail Left, Step to pattern, Sideways Number of Stairs: 4 General stair comments: Pt completed sideways stair training with min guard and VCs for sequencing, no overt LOB noted or physical assist required.   Wheelchair Mobility    Modified Rankin (Stroke Patients Only)       Balance Overall balance assessment: Needs assistance Sitting-balance support: Feet supported, No upper extremity supported Sitting balance-Leahy Scale: Good     Standing balance support: Bilateral upper extremity supported, During functional activity, Single extremity supported Standing balance-Leahy Scale: Fair Standing balance comment: Able to stand statically with single UE support, required BUE supoprt on RW during dynamic mobility                            Cognition Arousal/Alertness: Awake/alert Behavior During Therapy: WFL for tasks assessed/performed Overall Cognitive Status: Within Functional Limits for tasks assessed  Exercises Total Joint Exercises Ankle Circles/Pumps: AROM, Both, 20 reps, Supine Quad Sets: AROM, Right, 10 reps, Supine Short Arc Quad: AROM, Right, 10 reps, Supine Heel  Slides: AROM, Right, 10 reps, Supine Hip ABduction/ADduction: AROM, Right, 10 reps, Supine Other Exercises Other Exercises: Sit to stand/stand to sit transfer x8 with repeated cuing with goal to improve safety with this transfer, daughter completing 3 reps and cuing appropriatley with PT supervision    General Comments General comments (skin integrity, edema, etc.): Daughter rachel present      Pertinent Vitals/Pain Pain Assessment Pain Assessment: 0-10 Pain Score: 3  Pain Location: Left hip and low back Pain Descriptors / Indicators: Operative site guarding Pain Intervention(s): Monitored during session, Repositioned, Ice applied    Home Living                          Prior Function            PT Goals (current goals can now be found in the care plan section) Acute Rehab PT Goals Patient Stated Goal: to dance again PT Goal Formulation: With patient Time For Goal Achievement: 10/09/22 Potential to Achieve Goals: Good Progress towards PT goals: Progressing toward goals    Frequency    7X/week      PT Plan Current plan remains appropriate    Co-evaluation              AM-PAC PT "6 Clicks" Mobility   Outcome Measure  Help needed turning from your back to your side while in a flat bed without using bedrails?: None Help needed moving from lying on your back to sitting on the side of a flat bed without using bedrails?: A Little Help needed moving to and from a bed to a chair (including a wheelchair)?: A Little Help needed standing up from a chair using your arms (e.g., wheelchair or bedside chair)?: A Little Help needed to walk in hospital room?: A Little Help needed climbing 3-5 steps with a railing? : A Little 6 Click Score: 19    End of Session Equipment Utilized During Treatment: Gait belt Activity Tolerance: Patient tolerated treatment well;No increased pain Patient left: with call bell/phone within reach;with chair alarm set;with  family/visitor present;in chair Nurse Communication: Mobility status PT Visit Diagnosis: Pain;Difficulty in walking, not elsewhere classified (R26.2) Pain - Right/Left: Left Pain - part of body: Hip     Time: 7939-0300 PT Time Calculation (min) (ACUTE ONLY): 35 min  Charges:  $Gait Training: 8-22 mins $Therapeutic Exercise: 8-22 mins                     Coolidge Breeze, PT, DPT [facility] Department Office: [phone] Weekend pager: [phone]   Coolidge Breeze 10/03/2022, 10:42 AM

## 2022-10-03 NOTE — TOC Transition Note (Signed)
Transition of Care [facility]) - CM/SW Discharge Note   Patient Details  Name: Glenda Perez MRN: [id number] Date of Birth: Mar 28, 1941  [facility] CM/SW Contact:  Lennart Pall, LCSW Phone Number: 10/03/2022, 9:54 AM   Clinical Narrative:    Met with pt and confirming she has received RW to room via [facility].  Plan for HEP.  No further TOC needs.   Final next level of care: Home/Self Care Barriers to Discharge: No Barriers Identified   Patient Goals and CMS Choice Patient states their goals for this hospitalization and ongoing recovery are:: return home      Discharge Placement                       Discharge Plan and Services                DME Arranged: Walker rolling DME Agency: [place]                  Social Determinants of Health (SDOH) Interventions     Readmission Risk Interventions     No data to display

## 2022-10-03 NOTE — Plan of Care (Signed)
  Problem: Pain Management: Goal: Pain level will decrease with appropriate interventions Outcome: Progressing   Problem: Activity: Goal: Ability to avoid complications of mobility impairment will improve Outcome: Progressing Goal: Ability to tolerate increased activity will improve Outcome: Progressing   

## 2022-10-03 NOTE — Progress Notes (Signed)
Discharge package printed and instructions given to pt and daughter [name]. Verbalize understanding.

## 2022-10-03 NOTE — Progress Notes (Signed)
Physical Therapy Treatment Patient Details Name: Glenda Perez MRN: [id number] DOB: 23-Aug-1941 Today's Date: 10/03/2022   History of Present Illness Pt is an 81yo female presenting s/p L-THA, AA on 10/02/22. PMH: osteopenia.    PT Comments    Pt seen POD1 received long-sitting in bed dressed and ready to mobilize. Demonstrated modified independence for bed mobility, supervision for transfers, and min guard to supervision for ambulation in hallway with RW 331f. Pt completed stair training with min guard to min assist, pt completed various methods of stair mobility with daughter providing guarding/assistance and PT close supervision. Provided HEP and pt completed exercises with safe form and minimal cuing, demonstrated appropriate progression with emphasis on walking program with RW. All education completed and pt has no further questions. Pt has met mobility goals for safe discharge home, PT is signing off, should needs change please reconsult. Thank you for this referral.    Recommendations for follow up therapy are one component of a multi-disciplinary discharge planning process, led by the attending physician.  Recommendations may be updated based on patient status, additional functional criteria and insurance authorization.  Follow Up Recommendations  Follow physician's recommendations for discharge plan and follow up therapies     Assistance Recommended at Discharge Frequent or constant Supervision/Assistance  Patient can return home with the following A little help with walking and/or transfers;A little help with bathing/dressing/bathroom;Assistance with cooking/housework;Assist for transportation;Help with stairs or ramp for entrance   Equipment Recommendations  Rolling walker (2 wheels)    Recommendations for Other Services       Precautions / Restrictions Precautions Precautions: Fall Precaution Comments: watch BP Restrictions Weight Bearing Restrictions: No Other  Position/Activity Restrictions: wbat     Mobility  Bed Mobility Overal bed mobility: Needs Assistance Bed Mobility: Supine to Sit     Supine to sit: Modified independent (Device/Increase time) Sit to supine: Supervision, HOB elevated   General bed mobility comments: Increased time    Transfers Overall transfer level: Needs assistance Equipment used: Rolling walker (2 wheels) Transfers: Sit to/from Stand Sit to Stand: Supervision           General transfer comment: For safety only, Vcs for sequencing.    Ambulation/Gait Ambulation/Gait assistance: Supervision Gait Distance (Feet): 300 Feet Assistive device: Rolling walker (2 wheels) Gait Pattern/deviations: Step-to pattern Gait velocity: decreased     General Gait Details: Pt ambulated with RW and min guard no physical assist required or overt LOB noted. Vcs for proximity to device.   Stairs Stairs: Yes Stairs assistance: Min guard, Supervision Stair Management: Step to pattern, No rails, Backwards, Forwards, With walker, With cane Number of Stairs: 10 General stair comments: Pt completed stair training via a variety of methods including backwards with RW and min assist from daughter PT providing min guard, forwards using SPC and L railing with daughter providing min guard, and forward with daughter on L and SPC on right; all to mimic the variety of stairs pt has in her home. handouts provided. Pt without overt LOB noted. CAregiver provided appropriate gaurding.   Wheelchair Mobility    Modified Rankin (Stroke Patients Only)       Balance Overall balance assessment: Needs assistance Sitting-balance support: Feet supported, No upper extremity supported Sitting balance-Leahy Scale: Good     Standing balance support: Bilateral upper extremity supported, During functional activity, Single extremity supported Standing balance-Leahy Scale: Fair Standing balance comment: Able to stand statically with single UE  support, required BUE supoprt on RW during dynamic mobility  Cognition Arousal/Alertness: Awake/alert Behavior During Therapy: WFL for tasks assessed/performed Overall Cognitive Status: Within Functional Limits for tasks assessed                                          Exercises Total Joint Exercises Ankle Circles/Pumps: AROM, Both, 20 reps, Supine Quad Sets: AROM, Right, 10 reps, Supine Short Arc Quad: AROM, Right, 10 reps, Supine Heel Slides: AROM, Right, 10 reps, Supine Hip ABduction/ADduction: AROM, Standing (Demonstrated/educated, not performed) Long Arc Quad: Other (comment), Seated (Demonstrated/educated, not performed) Knee Flexion: Other (comment) (Standing hamstring curls Demonstrated/educated, not performed) Marching in Standing: Other (comment) (Demonstrated/educated, not performed) Standing Hip Extension: Other (comment) (Demonstrated/educated, not performed) Other Exercises Other Exercises: Sit to stand/stand to sit transfer x8 with repeated cuing with goal to improve safety with this transfer, daughter completing 3 reps and cuing appropriatley with PT supervision    General Comments General comments (skin integrity, edema, etc.): [name] present      Pertinent Vitals/Pain Pain Assessment Pain Assessment: No/denies pain Pain Score: 0-No pain Pain Location: Left hip and low back Pain Descriptors / Indicators: Operative site guarding Pain Intervention(s): Monitored during session, Repositioned, Ice applied    Home Living                          Prior Function            PT Goals (current goals can now be found in the care plan section) Acute Rehab PT Goals Patient Stated Goal: to dance again PT Goal Formulation: With patient Time For Goal Achievement: 10/09/22 Potential to Achieve Goals: Good Progress towards PT goals: Goals met/education completed, patient discharged from PT     Frequency    7X/week      PT Plan Current plan remains appropriate    Co-evaluation              AM-PAC PT "6 Clicks" Mobility   Outcome Measure  Help needed turning from your back to your side while in a flat bed without using bedrails?: None Help needed moving from lying on your back to sitting on the side of a flat bed without using bedrails?: A Little Help needed moving to and from a bed to a chair (including a wheelchair)?: A Little Help needed standing up from a chair using your arms (e.g., wheelchair or bedside chair)?: A Little Help needed to walk in hospital room?: A Little Help needed climbing 3-5 steps with a railing? : A Little 6 Click Score: 19    End of Session Equipment Utilized During Treatment: Gait belt Activity Tolerance: Patient tolerated treatment well;No increased pain Patient left: with call bell/phone within reach;with chair alarm set;with family/visitor present;in chair Nurse Communication: Mobility status PT Visit Diagnosis: Pain;Difficulty in walking, not elsewhere classified (R26.2) Pain - Right/Left: Left Pain - part of body: Hip     Time: 3953-2023 PT Time Calculation (min) (ACUTE ONLY): 38 min  Charges:  $Gait Training: 23-37 mins $Therapeutic Exercise: 8-22 mins                    weight bearing   Coolidge Breeze 10/03/2022, 3:52 PM

## 2022-10-04 ENCOUNTER — Encounter (HOSPITAL_COMMUNITY): Payer: Self-pay | Admitting: Orthopedic Surgery

## 2022-10-05 ENCOUNTER — Ambulatory Visit: Payer: Medicare HMO | Admitting: Internal Medicine

## 2022-10-09 NOTE — Discharge Summary (Signed)
Patient ID: Glenda Perez MRN: [id number] DOB/AGE: 1941/09/09 81 y.o.  Admit date: 10/02/2022 Discharge date: 10/03/2022  Admission Diagnoses:  Left hip osteoarthritis  Discharge Diagnoses:  Principal Problem:   S/P total left hip arthroplasty   Past Medical History:  Diagnosis Date   Anxiety    clonezapam   Arthritis    Leaky heart valve    Osteopenia    Reactive hypoglycemia    Thyroid disease     Surgeries: Procedure(s): TOTAL HIP ARTHROPLASTY ANTERIOR APPROACH on 10/02/2022   Consultants:   Discharged Condition: Improved  Hospital Course: Glenda Perez is an 81 y.o. female who was admitted 10/02/2022 for operative treatment ofS/P total left hip arthroplasty. Patient has severe unremitting pain that affects sleep, daily activities, and work/hobbies. After pre-op clearance the patient was taken to the operating room on 10/02/2022 and underwent  Procedure(s): TOTAL HIP ARTHROPLASTY ANTERIOR APPROACH.    Patient was given perioperative antibiotics:  Anti-infectives (From admission, onward)    Start     Dose/Rate Route Frequency Ordered Stop   10/02/22 1700  ceFAZolin (ANCEF) IVPB 2g/100 mL premix        2 g 200 mL/hr over 30 Minutes Intravenous Every 6 hours 10/02/22 1338 10/02/22 2328   10/02/22 0915  ceFAZolin (ANCEF) IVPB 2g/100 mL premix        2 g 200 mL/hr over 30 Minutes Intravenous On call to O.R. 10/02/22 0901 10/02/22 1106        Patient was given sequential compression devices, early ambulation, and chemoprophylaxis to prevent DVT. Patient worked with PT and was meeting their goals regarding safe ambulation and transfers.  Patient benefited maximally from hospital stay and there were no complications.    Recent vital signs: No data found.   Recent laboratory studies: No results for input(s): "WBC", "HGB", "HCT", "PLT", "NA", "K", "CL", "CO2", "BUN", "CREATININE", "GLUCOSE", "INR", "CALCIUM" in the last 72 hours.  Invalid input(s): "PT",  "2"   Discharge Medications:   Allergies as of 10/03/2022       Reactions   Amoxicillin Rash   Has patient had a PCN reaction causing immediate rash, facial/tongue/throat swelling, SOB or lightheadedness with hypotension:No Has patient had a PCN reaction causing severe rash involving mucus membranes or skin necrosis:No Has patient had a PCN reaction that required hospitalization:No Has patient had a PCN reaction occurring within the last 10 years:Yes If all of the above answers are "NO", then may proceed with Cephalosporin use.   Clavulanic Acid    Erythromycin Other (See Comments)   Unknown   Ibuprofen Other (See Comments)   Bloody stool   Penicillins Other (See Comments)   Unknown Tolerated Cephalosporin Date: 10/02/22.   Other Rash   All cillins Other reaction(s): tingling in fingers        Medication List     STOP taking these medications    ondansetron 4 MG disintegrating tablet Commonly known as: ZOFRAN-ODT       TAKE these medications    acetaminophen 500 MG tablet Commonly known as: TYLENOL Take 1 tablet (500 mg total) by mouth every 6 (six) hours as needed.   aspirin 81 MG chewable tablet Chew 1 tablet (81 mg total) by mouth 2 (two) times daily for 28 days. For prevention of blood clot   CITRACAL PO Take 1 tablet by mouth 2 (two) times daily.   clonazePAM 0.5 MG tablet Commonly known as: KLONOPIN Take 0.5 mg by mouth at bedtime.   cyanocobalamin 1000 MCG tablet Commonly known  as: VITAMIN B12 Take 1,000 mcg by mouth daily.   diphenhydramine-acetaminophen 25-500 MG Tabs tablet Commonly known as: TYLENOL PM Take 1 tablet by mouth at bedtime as needed (pain).   levothyroxine 50 MCG tablet Commonly known as: SYNTHROID Take 1 tablet by mouth 2 days a week What changed:  how much to take how to take this when to take this additional instructions   levothyroxine 75 MCG tablet Commonly known as: SYNTHROID Take 1 tablet by mouth 5 days a  week What changed: Another medication with the same name was changed. Make sure you understand how and when to take each.   methocarbamol 500 MG tablet Commonly known as: ROBAXIN Take 1 tablet (500 mg total) by mouth every 6 (six) hours as needed for muscle spasms (muscle pain).   polyethylene glycol 17 g packet Commonly known as: MIRALAX / GLYCOLAX Take 17 g by mouth daily.   selenium 200 MCG Tabs tablet Take 200 mcg by mouth daily.   traMADol 50 MG tablet Commonly known as: ULTRAM Take 1-2 tablets (50-100 mg total) by mouth every 6 (six) hours as needed for moderate pain or severe pain.   Turmeric 500 MG Tabs Take 500 mg by mouth daily.   Vitamin D3 50 MCG (2000 UT) capsule Take 2,000 Units by mouth daily.               Discharge Care Instructions  (From admission, onward)           Start     Ordered   10/03/22 0000  Change dressing       Comments: Maintain surgical dressing until follow up in the clinic. If the edges start to pull up, may reinforce with tape. If the dressing is no longer working, may remove and cover with gauze and tape, but must keep the area dry and clean.  Call with any questions or concerns.   10/03/22 0728            Diagnostic Studies: DG Pelvis Portable  Result Date: 10/02/2022 CLINICAL DATA:  Status post left hip arthroplasty. EXAM: PORTABLE PELVIS 1-2 VIEWS COMPARISON:  Fluoroscopic images of same day. FINDINGS: The left acetabular and femoral components are well situated. Expected postoperative changes are seen in the surrounding soft tissues. IMPRESSION: Status post left total hip arthroplasty. Electronically Signed   By: Marijo Conception M.D.   On: 10/02/2022 13:12   DG HIP UNILAT WITH PELVIS 1V LEFT  Result Date: 10/02/2022 CLINICAL DATA:  Left hip surgery EXAM: DG HIP (WITH OR WITHOUT PELVIS) 1V*L* COMPARISON:  06/12/2022 FINDINGS: Two C-arm fluoroscopic images were obtained intraoperatively and submitted for post operative  interpretation. Images demonstrate left total hip arthroplasty hardware in place. 14 seconds fluoroscopy time utilized. Radiation dose: 1.08 mGy. Please see the performing provider's procedural report for further detail. IMPRESSION: Intraoperative fluoroscopic guidance for left total hip arthroplasty. Electronically Signed   By: Davina Poke D.O.   On: 10/02/2022 12:13   DG C-Arm 1-60 Min-No Report  Result Date: 10/02/2022 Fluoroscopy was utilized by the requesting physician.  No radiographic interpretation.    Disposition: Discharge disposition: 01-Home or Self Care       Discharge Instructions     Call MD / Call 911   Complete by: As directed    If you experience chest pain or shortness of breath, CALL 911 and be transported to the hospital emergency room.  If you develope a fever above 101 F, pus (white drainage) or increased drainage  or redness at the wound, or calf pain, call your surgeon's office.   Change dressing   Complete by: As directed    Maintain surgical dressing until follow up in the clinic. If the edges start to pull up, may reinforce with tape. If the dressing is no longer working, may remove and cover with gauze and tape, but must keep the area dry and clean.  Call with any questions or concerns.   Constipation Prevention   Complete by: As directed    Drink plenty of fluids.  Prune juice may be helpful.  You may use a stool softener, such as Colace (over the counter) 100 mg twice a day.  Use MiraLax (over the counter) for constipation as needed.   Diet - low sodium heart healthy   Complete by: As directed    Increase activity slowly as tolerated   Complete by: As directed    Weight bearing as tolerated with assist device (walker, cane, etc) as directed, use it as long as suggested by your surgeon or therapist, typically at least 4-6 weeks.   Post-operative opioid taper instructions:   Complete by: As directed    POST-OPERATIVE OPIOID TAPER INSTRUCTIONS: It is  important to wean off of your opioid medication as soon as possible. If you do not need pain medication after your surgery it is ok to stop day one. Opioids include: Codeine, Hydrocodone(Norco, Vicodin), Oxycodone(Percocet, oxycontin) and hydromorphone amongst others.  Long term and even short term use of opiods can cause: Increased pain response Dependence Constipation Depression Respiratory depression And more.  Withdrawal symptoms can include Flu like symptoms Nausea, vomiting And more Techniques to manage these symptoms Hydrate well Eat regular healthy meals Stay active Use relaxation techniques(deep breathing, meditating, yoga) Do Not substitute Alcohol to help with tapering If you have been on opioids for less than two weeks and do not have pain than it is ok to stop all together.  Plan to wean off of opioids This plan should start within one week post op of your joint replacement. Maintain the same interval or time between taking each dose and first decrease the dose.  Cut the total daily intake of opioids by one tablet each day Next start to increase the time between doses. The last dose that should be eliminated is the evening dose.      TED hose   Complete by: As directed    Use stockings (TED hose) for 2 weeks on both leg(s).  You may remove them at night for sleeping.        Follow-up Information     Paralee Cancel, MD. Go on 10/19/2022.   Specialty: Orthopedic Surgery Why: You are scheduled for first post op appt on Friday December 29 at 11:00am. Contact information: [address] [place] [address] [phone]                  Signed: Irving Copas 10/09/2022, 7:15 AM

## 2022-11-02 ENCOUNTER — Ambulatory Visit: Payer: Medicare HMO | Admitting: Internal Medicine

## 2022-11-02 ENCOUNTER — Other Ambulatory Visit: Payer: Self-pay | Admitting: Internal Medicine

## 2022-11-02 ENCOUNTER — Encounter: Payer: Self-pay | Admitting: Internal Medicine

## 2022-11-02 VITALS — BP 118/64 | HR 71 | Ht 62.25 in | Wt 107.6 lb

## 2022-11-02 DIAGNOSIS — E0500 Thyrotoxicosis with diffuse goiter without thyrotoxic crisis or storm: Secondary | ICD-10-CM | POA: Diagnosis not present

## 2022-11-02 DIAGNOSIS — E890 Postprocedural hypothyroidism: Secondary | ICD-10-CM | POA: Diagnosis not present

## 2022-11-02 DIAGNOSIS — Z8639 Personal history of other endocrine, nutritional and metabolic disease: Secondary | ICD-10-CM

## 2022-11-02 NOTE — Patient Instructions (Addendum)
Please continue Levothyroxine 75 mcg 5/7 days and 50 mcg 2/7 days.  Take the thyroid hormone every day, with water, at least 30 minutes before breakfast, separated by at least 4 hours from: - acid reflux medications - calcium - iron - multivitamins  Please move the calcium >4h after levothyroxine.  Come back for labs in 5-6 weeks.  Please come back for a follow-up appointment in 1 year.

## 2022-11-02 NOTE — Progress Notes (Signed)
Patient ID: Glenda Perez, female   DOB: Sep 07, 1941, 82 y.o.   MRN: [id number]   HPI  Glenda Perez is a 82 y.o.-year-old female, initially referred by her PCP, Dr. 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  Last visit 1 year ago. She prev. Saw Dr. Lonna Duval at [facility] (notes reviewed). She prev. saw Dr. Tobe Sos and Dr. Buddy Duty.  Interim history: She had COVID for the first time 05/2022.  She recovered well. She had a left total hip replacement 10/02/2022.  She is already back to walking 2 miles a day. She is preparing for a trip to [place] this spring.  Reviewed history: Pt. has been dx with hypothyroidism in 2011 after she had RAI ablation for Graves ds. >>  On levothyroxine.  In 2018 we moved levothyroxine from bedtime to a.m.  TFTs remained controlled since then.  Pt is on levothyroxine 75 mcg 5/7 days and 50 mcg 2/7 days: - in am (when waking up to go to the restroom)  - fasting - at least 30 min from b'fast and coffee with half-and-half - + calcium citrate - 1h after LT4! - no iron - no multivitamins - no PPIs - stopped on Biotin  - stopped since last OV  Reviewed her TFTs: Lab Results  Component Value Date   TSH 1.28 03/22/2022   TSH 5.64 (H) 02/07/2022   TSH 0.30 (L) 12/13/2021   TSH 0.34 (L) 10/10/2021   TSH 1.03 10/06/2020   TSH 1.82 08/04/2019   TSH 0.45 05/20/2019   TSH 1.32 10/03/2018   TSH 2.00 10/04/2017   TSH 0.51 07/25/2017   FREET4 1.06 03/22/2022   FREET4 0.85 02/07/2022   FREET4 1.38 12/13/2021   FREET4 1.14 10/10/2021   FREET4 0.91 10/06/2020   FREET4 1.16 08/04/2019   FREET4 1.37 05/20/2019   FREET4 0.86 10/03/2018   FREET4 1.05 07/25/2017   FREET4 1.20 05/15/2017  08/30/2016: TSH 0.704 01/11/2016: TSH 0.605  Her TSIs remain elevated: Lab Results  Component Value Date   TSI 343 (H) 10/10/2021   TSI 244 (H) 10/06/2020   TSI 404 (H) 08/04/2019   TSI 386 (H) 10/03/2018   TSI  >700 (H) 05/15/2017   TSI 487 (H) 05/12/2012   TSI 560 (H) 02/15/2012   TSI 603 (H) 01/16/2012   TSI 539 (H) 12/17/2011   TSI 413 (H) 11/20/2011   Pt denies: - feeling nodules in neck - hoarseness - dysphagia - choking  No FH of thyroid disease or thyroid cancer. No h/o radiation tx to head or neck. On Turmeric - 2x a day. No recent steroids use.   She has a history of Graves' ophthalmopathy and sees ophthalmology-Dr. Prudencio Burly.  This improved after she started to take 200 mcg selenium daily.  She also has a h/o reactive hypoglycemia.  ROS: + see HPI  I reviewed pt's medications, allergies, PMH, social hx, family hx, and changes were documented in the history of present illness. Otherwise, unchanged from my initial visit note.  Past Medical History:  Diagnosis Date   Anxiety    clonezapam   Arthritis    Leaky heart valve    Osteopenia    Reactive hypoglycemia    Thyroid disease    Past Surgical History:  Procedure Laterality Date   BIOPSY BREAST     COLONOSCOPY     OOPHORECTOMY     TOTAL HIP ARTHROPLASTY Left 10/02/2022   Procedure: TOTAL HIP ARTHROPLASTY ANTERIOR APPROACH;  Surgeon: Paralee Cancel, MD;  Location: [facility];  Service: Orthopedics;  Laterality: Left;   UPPER GASTROINTESTINAL ENDOSCOPY     Social History   Social History   Marital status: Widowed    Spouse name: N/A   Number of children: 1   Occupational History   artist   Social History Main Topics   Smoking status: Never Smoker   Smokeless tobacco: Never Used   Alcohol use     3-5 Glasses of wine per week   Drug use: No   Current Outpatient Medications on File Prior to Visit  Medication Sig Dispense Refill   acetaminophen (TYLENOL) 500 MG tablet Take 1 tablet (500 mg total) by mouth every 6 (six) hours as needed. 30 tablet 0   Calcium Citrate (CITRACAL PO) Take 1 tablet by mouth 2 (two) times daily.     Cholecalciferol (VITAMIN D3) 50 MCG (2000 UT) capsule Take 2,000 Units by mouth daily.      clonazePAM (KLONOPIN) 0.5 MG tablet Take 0.5 mg by mouth at bedtime.     cyanocobalamin (VITAMIN B12) 1000 MCG tablet Take 1,000 mcg by mouth daily.     diphenhydramine-acetaminophen (TYLENOL PM) 25-500 MG TABS tablet Take 1 tablet by mouth at bedtime as needed (pain).     levothyroxine (SYNTHROID) 50 MCG tablet Take 1 tablet by mouth 2 days a week (Patient taking differently: Take 50 mcg by mouth See admin instructions. Take 50 mg Saturday and Sunday) 26 tablet 1   levothyroxine (SYNTHROID) 75 MCG tablet Take 1 tablet by mouth 5 days a week 62 tablet 1   methocarbamol (ROBAXIN) 500 MG tablet Take 1 tablet (500 mg total) by mouth every 6 (six) hours as needed for muscle spasms (muscle pain). 40 tablet 1   polyethylene glycol (MIRALAX / GLYCOLAX) 17 g packet Take 17 g by mouth daily. 14 each 0   selenium 200 MCG TABS tablet Take 200 mcg by mouth daily.     traMADol (ULTRAM) 50 MG tablet Take 1-2 tablets (50-100 mg total) by mouth every 6 (six) hours as needed for moderate pain or severe pain. 20 tablet 0   Turmeric 500 MG TABS Take 500 mg by mouth daily.     No current facility-administered medications on file prior to visit.   Allergies  Allergen Reactions   Amoxicillin Rash    Has patient had a PCN reaction causing immediate rash, facial/tongue/throat swelling, SOB or lightheadedness with hypotension:No Has patient had a PCN reaction causing severe rash involving mucus membranes or skin necrosis:No Has patient had a PCN reaction that required hospitalization:No Has patient had a PCN reaction occurring within the last 10 years:Yes If all of the above answers are "NO", then may proceed with Cephalosporin use.    Clavulanic Acid    Erythromycin Other (See Comments)    Unknown   Ibuprofen Other (See Comments)    Bloody stool   Penicillins Other (See Comments)    Unknown Tolerated Cephalosporin Date: 10/02/22.     Other Rash    All cillins Other reaction(s): tingling in fingers    Family History  Problem Relation Age of Onset   Colon cancer Neg Hx    Esophageal cancer Neg Hx    Pancreatic cancer Neg Hx    Rectal cancer Neg Hx    Stomach cancer Neg Hx    PE: BP 118/64 (BP Location: Right Arm, Patient Position: Sitting, Cuff Size: Normal)   Pulse 71   Ht 5' 2.25" (1.581 m)   Wt 107 lb 9.6  oz (48.8 kg)   SpO2 99%   BMI 19.52 kg/m  Wt Readings from Last 3 Encounters:  11/02/22 107 lb 9.6 oz (48.8 kg)  10/02/22 107 lb (48.5 kg)  09/19/22 107 lb (48.5 kg)   Constitutional: normal weight, in NAD Eyes: PERRLA, EOMI, + B mild exophthalmos and stare ENT: no thyromegaly, no cervical lymphadenopathy Cardiovascular: RRR, No MRG Respiratory: CTA B Musculoskeletal: no deformities Skin: no rashes, + bluish discoloration of the first 3 fingers in bilateral hands (has a history of Raynaud's phenomenon) Neurological: no tremor with outstretched hands  ASSESSMENT: 1. Postablative Hypothyroidism - h/o Graves ds.  2. Graves ophthalmopathy (GO)  3.  History of Graves' disease  PLAN:  1. Patient with l longstanding post ablative hypothyroidism, on levothyroxine therapy - Latest thyroid labs reviewed with patient-normal TSH after the last change in dose: Lab Results  Component Value Date   TSH 1.28 03/22/2022  - she is now on LT4 75 mcg 5/7 days and 50 mcg 2/7 days - pt feels good on this dose. - we discussed about taking the thyroid hormone every day, with water, >30 minutes before breakfast, separated by >4 hours from acid reflux medications, calcium, iron, multivitamins. Pt. is taking it correctly, except for the fact that she calcium citrate too close to levothyroxine, 1 hour later.  I advised her to move the calcium at least 4 hours later. - will check thyroid tests in 5 to 6 weeks: TSH and fT4 - If labs are abnormal, she will need to return for repeat TFTs in 1.5 months  2. GO -now mild -Improved after starting selenium 200 mcg daily -No double vision,  blurry vision, eye pain, chemosis -She continues to see Dr. Prudencio Burly with ophthalmology  3.  History of Graves' disease -Latest TSI antibodies were elevated, higher than before: Lab Results  Component Value Date   TSI 343 (H) 10/10/2021  -We will recheck these at next lab draw  Orders Placed This Encounter  Procedures   TSH   T4, free   Thyroid stimulating immunoglobulin   Philemon Kingdom, MD PhD [facility] Endocrinology

## 2022-11-15 ENCOUNTER — Other Ambulatory Visit: Payer: Self-pay

## 2022-11-15 DIAGNOSIS — E890 Postprocedural hypothyroidism: Secondary | ICD-10-CM

## 2022-11-15 MED ORDER — LEVOTHYROXINE SODIUM 50 MCG PO TABS: ORAL_TABLET | ORAL | 1 refills | Status: DC

## 2022-11-21 DIAGNOSIS — Z96642 Presence of left artificial hip joint: Secondary | ICD-10-CM | POA: Diagnosis not present

## 2022-11-21 DIAGNOSIS — Z1231 Encounter for screening mammogram for malignant neoplasm of breast: Secondary | ICD-10-CM | POA: Diagnosis not present

## 2022-11-21 DIAGNOSIS — Z471 Aftercare following joint replacement surgery: Secondary | ICD-10-CM | POA: Diagnosis not present

## 2022-11-23 ENCOUNTER — Other Ambulatory Visit (HOSPITAL_BASED_OUTPATIENT_CLINIC_OR_DEPARTMENT_OTHER): Payer: Self-pay

## 2022-12-06 ENCOUNTER — Other Ambulatory Visit (INDEPENDENT_AMBULATORY_CARE_PROVIDER_SITE_OTHER): Payer: Medicare HMO

## 2022-12-06 ENCOUNTER — Encounter: Payer: Self-pay | Admitting: Internal Medicine

## 2022-12-06 DIAGNOSIS — E890 Postprocedural hypothyroidism: Secondary | ICD-10-CM | POA: Diagnosis not present

## 2022-12-06 DIAGNOSIS — E0500 Thyrotoxicosis with diffuse goiter without thyrotoxic crisis or storm: Secondary | ICD-10-CM | POA: Diagnosis not present

## 2022-12-06 LAB — T4, FREE: Free T4: 1.17 ng/dL (ref 0.60–1.60)

## 2022-12-06 LAB — TSH: TSH: 1.04 u[IU]/mL (ref 0.35–5.50)

## 2022-12-07 ENCOUNTER — Encounter: Payer: Self-pay | Admitting: Internal Medicine

## 2022-12-07 ENCOUNTER — Other Ambulatory Visit: Payer: Medicare HMO

## 2022-12-10 LAB — THYROID STIMULATING IMMUNOGLOBULIN: TSI: 227 % baseline — ABNORMAL HIGH (ref <140)

## 2022-12-31 ENCOUNTER — Encounter: Payer: Self-pay | Admitting: Addiction (Substance Use Disorder)

## 2023-01-09 DIAGNOSIS — H04123 Dry eye syndrome of bilateral lacrimal glands: Secondary | ICD-10-CM | POA: Diagnosis not present

## 2023-01-09 DIAGNOSIS — H5203 Hypermetropia, bilateral: Secondary | ICD-10-CM | POA: Diagnosis not present

## 2023-01-09 DIAGNOSIS — H25042 Posterior subcapsular polar age-related cataract, left eye: Secondary | ICD-10-CM | POA: Diagnosis not present

## 2023-01-09 DIAGNOSIS — H2513 Age-related nuclear cataract, bilateral: Secondary | ICD-10-CM | POA: Diagnosis not present

## 2023-01-09 DIAGNOSIS — H43813 Vitreous degeneration, bilateral: Secondary | ICD-10-CM | POA: Diagnosis not present

## 2023-01-09 DIAGNOSIS — H524 Presbyopia: Secondary | ICD-10-CM | POA: Diagnosis not present

## 2023-01-11 DIAGNOSIS — Z96642 Presence of left artificial hip joint: Secondary | ICD-10-CM | POA: Diagnosis not present

## 2023-01-11 DIAGNOSIS — Z471 Aftercare following joint replacement surgery: Secondary | ICD-10-CM | POA: Diagnosis not present

## 2023-01-22 ENCOUNTER — Other Ambulatory Visit: Payer: Self-pay

## 2023-01-22 DIAGNOSIS — E890 Postprocedural hypothyroidism: Secondary | ICD-10-CM

## 2023-01-22 MED ORDER — LEVOTHYROXINE SODIUM 50 MCG PO TABS: ORAL_TABLET | ORAL | 1 refills | Status: DC

## 2023-01-22 MED ORDER — LEVOTHYROXINE SODIUM 75 MCG PO TABS: ORAL_TABLET | ORAL | 3 refills | Status: DC

## 2023-03-15 DIAGNOSIS — Z0000 Encounter for general adult medical examination without abnormal findings: Secondary | ICD-10-CM | POA: Diagnosis not present

## 2023-03-15 DIAGNOSIS — G44209 Tension-type headache, unspecified, not intractable: Secondary | ICD-10-CM | POA: Diagnosis not present

## 2023-03-15 DIAGNOSIS — R110 Nausea: Secondary | ICD-10-CM | POA: Diagnosis not present

## 2023-03-19 ENCOUNTER — Telehealth: Payer: Self-pay

## 2023-03-19 NOTE — Telephone Encounter (Signed)
Pt called to advise her Levothyroxine 75 mcg was accidentally discarded and she found some expired medication to take of the same dose. Wants to know if that is ok to take or should she get a new rx from the pharmacy.

## 2023-03-19 NOTE — Telephone Encounter (Signed)
Pt contacted and advised to not take expired medication and to get refill from pharmacy.

## 2023-03-28 DIAGNOSIS — F4320 Adjustment disorder, unspecified: Secondary | ICD-10-CM | POA: Diagnosis not present

## 2023-04-10 DIAGNOSIS — F4320 Adjustment disorder, unspecified: Secondary | ICD-10-CM | POA: Diagnosis not present

## 2023-04-17 DIAGNOSIS — I700 Atherosclerosis of aorta: Secondary | ICD-10-CM | POA: Diagnosis not present

## 2023-04-17 DIAGNOSIS — R42 Dizziness and giddiness: Secondary | ICD-10-CM | POA: Diagnosis not present

## 2023-04-17 DIAGNOSIS — I10 Essential (primary) hypertension: Secondary | ICD-10-CM | POA: Diagnosis not present

## 2023-04-26 DIAGNOSIS — F4320 Adjustment disorder, unspecified: Secondary | ICD-10-CM | POA: Diagnosis not present

## 2023-05-08 DIAGNOSIS — F4320 Adjustment disorder, unspecified: Secondary | ICD-10-CM | POA: Diagnosis not present

## 2023-05-10 DIAGNOSIS — M25422 Effusion, left elbow: Secondary | ICD-10-CM | POA: Diagnosis not present

## 2023-05-22 DIAGNOSIS — M7989 Other specified soft tissue disorders: Secondary | ICD-10-CM | POA: Diagnosis not present

## 2023-05-22 DIAGNOSIS — M25422 Effusion, left elbow: Secondary | ICD-10-CM | POA: Diagnosis not present

## 2023-05-24 ENCOUNTER — Encounter (HOSPITAL_BASED_OUTPATIENT_CLINIC_OR_DEPARTMENT_OTHER): Payer: Self-pay | Admitting: Emergency Medicine

## 2023-05-24 ENCOUNTER — Emergency Department (HOSPITAL_BASED_OUTPATIENT_CLINIC_OR_DEPARTMENT_OTHER)
Admission: EM | Admit: 2023-05-24 | Discharge: 2023-05-24 | Disposition: A | Discharge status: Home / Self Care | Payer: Medicare HMO | Source: Home / Self Care | Attending: Emergency Medicine | Admitting: Emergency Medicine

## 2023-05-24 ENCOUNTER — Emergency Department (HOSPITAL_BASED_OUTPATIENT_CLINIC_OR_DEPARTMENT_OTHER): Payer: Medicare HMO

## 2023-05-24 ENCOUNTER — Other Ambulatory Visit (HOSPITAL_BASED_OUTPATIENT_CLINIC_OR_DEPARTMENT_OTHER): Payer: Self-pay

## 2023-05-24 DIAGNOSIS — R519 Headache, unspecified: Secondary | ICD-10-CM | POA: Diagnosis not present

## 2023-05-24 DIAGNOSIS — R42 Dizziness and giddiness: Secondary | ICD-10-CM | POA: Diagnosis not present

## 2023-05-24 DIAGNOSIS — R4182 Altered mental status, unspecified: Secondary | ICD-10-CM | POA: Diagnosis not present

## 2023-05-24 DIAGNOSIS — R110 Nausea: Secondary | ICD-10-CM | POA: Diagnosis not present

## 2023-05-24 LAB — BASIC METABOLIC PANEL
Anion gap: 8 (ref 5–15)
BUN: 9 mg/dL (ref 8–23)
CO2: 28 mmol/L (ref 22–32)
Calcium: 9.5 mg/dL (ref 8.9–10.3)
Chloride: 98 mmol/L (ref 98–111)
Creatinine, Ser: 0.52 mg/dL (ref 0.44–1.00)
GFR, Estimated: >60 mL/min (ref >60)
Glucose, Bld: 121 mg/dL — ABNORMAL HIGH (ref 70–99)
Potassium: 3.8 mmol/L (ref 3.5–5.1)
Sodium: 134 mmol/L — ABNORMAL LOW (ref 135–145)

## 2023-05-24 LAB — CBC
HCT: 41.8 % (ref 36.0–46.0)
Hemoglobin: 14.3 g/dL (ref 12.0–15.0)
MCH: 32.6 pg (ref 26.0–34.0)
MCHC: 34.2 g/dL (ref 30.0–36.0)
MCV: 95.2 fL (ref 80.0–100.0)
Platelets: 206 10*3/uL (ref 150–400)
RBC: 4.39 MIL/uL (ref 3.87–5.11)
RDW: 13.1 % (ref 11.5–15.5)
WBC: 5.8 10*3/uL (ref 4.0–10.5)
nRBC: 0 % (ref 0.0–0.2)

## 2023-05-24 NOTE — ED Provider Notes (Cosign Needed Addendum)
[place] EMERGENCY DEPARTMENT AT [facility] Provider Note   CSN: [id number] Arrival date & time: 05/24/23  1250     History  Chief Complaint  Patient presents with   Dizziness    Courteny Egler is a 82 y.o. female PMHx anxiety, arthritis, thyroid disease who presents to ED  concerned for intermittent nausea, dizziness, HA since going on a walk yesterday in the hot weather. Patient went to bed after walk and she felt okay when she woke up but then progressively started feeling mildly dizzy again. Hx reactive hypoglycemia but does not think it is related to that. No blood thinners. Currently complaining of only mild dizziness right now. Patient ambulatory in ED without problem.  Denies fever, cough, chest pain, dyspnea, abdominal pain, vomiting, diarrhea,  dysuria, hematuria.   Dizziness      Home Medications Prior to Admission medications   Medication Sig Start Date End Date Taking? Authorizing Provider  acetaminophen (TYLENOL) 500 MG tablet Take 1 tablet (500 mg total) by mouth every 6 (six) hours as needed. 06/14/22   Waldon Merl, PA-C  Calcium Citrate (CITRACAL PO) Take 1 tablet by mouth 2 (two) times daily.    [provider]  Cholecalciferol (VITAMIN D3) 50 MCG (2000 UT) capsule Take 2,000 Units by mouth daily.    [provider]  clonazePAM (KLONOPIN) 0.5 MG tablet Take 0.5 mg by mouth at bedtime.    [provider]  cyanocobalamin (VITAMIN B12) 1000 MCG tablet Take 1,000 mcg by mouth daily.    [provider]  diphenhydramine-acetaminophen (TYLENOL PM) 25-500 MG TABS tablet Take 1 tablet by mouth at bedtime as needed (pain).    [provider]  levothyroxine (SYNTHROID) 50 MCG tablet Take 1 tablet by mouth 2 days a week 01/22/23   Carlus Pavlov, MD  levothyroxine (SYNTHROID) 75 MCG tablet TAKE 1 TABLET 5 DAYS A WEEK 01/22/23   Carlus Pavlov, MD  methocarbamol (ROBAXIN) 500 MG tablet Take 1 tablet (500 mg  total) by mouth every 6 (six) hours as needed for muscle spasms (muscle pain). 10/03/22   Cassandria Anger, PA-C  polyethylene glycol (MIRALAX / GLYCOLAX) 17 g packet Take 17 g by mouth daily. 10/03/22   Cassandria Anger, PA-C  selenium 200 MCG TABS tablet Take 200 mcg by mouth daily.    [provider]  traMADol (ULTRAM) 50 MG tablet Take 1-2 tablets (50-100 mg total) by mouth every 6 (six) hours as needed for moderate pain or severe pain. 10/03/22   Cassandria Anger, PA-C  Turmeric 500 MG TABS Take 500 mg by mouth daily.    [provider]      Allergies    Amoxicillin, Clavulanic acid, Erythromycin, Ibuprofen, Penicillins, and Other    Review of Systems   Review of Systems  Neurological:  Positive for dizziness.    Physical Exam Updated Vital Signs BP (!) 166/70   Pulse (!) 57   Temp 97.7 F (36.5 C)   Resp (!) 23   Wt 50 kg   SpO2 100%   BMI 20.00 kg/m  Physical Exam Vitals and nursing note reviewed.  Constitutional:      General: She is not in acute distress.    Appearance: She is not ill-appearing or toxic-appearing.  HENT:     Head: Normocephalic and atraumatic.     Mouth/Throat:     Mouth: Mucous membranes are moist.  Eyes:     General: No scleral icterus.  Right eye: No discharge.        Left eye: No discharge.     Conjunctiva/sclera: Conjunctivae normal.  Cardiovascular:     Rate and Rhythm: Normal rate and regular rhythm.     Pulses: Normal pulses.     Heart sounds: Normal heart sounds. No murmur heard. Pulmonary:     Effort: Pulmonary effort is normal. No respiratory distress.     Breath sounds: Normal breath sounds. No wheezing, rhonchi or rales.  Abdominal:     General: Abdomen is flat. Bowel sounds are normal.     Palpations: Abdomen is soft.     Tenderness: There is no abdominal tenderness.  Musculoskeletal:     Right lower leg: No edema.     Left lower leg: No edema.  Skin:    General: Skin is warm and dry.      Findings: No rash.  Neurological:     General: No focal deficit present.     Mental Status: She is alert. Mental status is at baseline.     Comments: GCS 15. Speech is goal oriented. No deficits appreciated to CN III-XII; symmetric eyebrow raise, no facial drooping, tongue midline. Patient has equal grip strength bilaterally with 5/5 strength against resistance in all major muscle groups bilaterally. Sensation to light touch intact. Patient moves extremities without ataxia. Normal finger-nose-finger. Patient ambulatory with steady gait.   Psychiatric:        Mood and Affect: Mood normal.     ED Results / Procedures / Treatments   Labs (all labs ordered are listed, but only abnormal results are displayed) Labs Reviewed  BASIC METABOLIC PANEL - Abnormal; Notable for the following components:      Result Value   Sodium 134 (*)    Glucose, Bld 121 (*)    All other components within normal limits  CBC  CBG MONITORING, ED    EKG None  Radiology CT HEAD WO CONTRAST  Result Date: 05/24/2023 CLINICAL DATA:  Altered mental status, dizziness EXAM: CT HEAD WITHOUT CONTRAST TECHNIQUE: Contiguous axial images were obtained from the base of the skull through the vertex without intravenous contrast. RADIATION DOSE REDUCTION: This exam was performed according to the departmental dose-optimization program which includes automated exposure control, adjustment of the mA and/or kV according to patient size and/or use of iterative reconstruction technique. COMPARISON:  MR brain done on 12/20/2020 FINDINGS: Brain: No acute intracranial findings are seen in noncontrast CT brain. There are no signs of bleeding within the cranium. Cortical sulci are prominent. Vascular: Unremarkable. Skull: No acute findings are seen. Sinuses/Orbits: There is frothy density in frontal sinus. There are no air-fluid levels. Other: There is increased amount of CSF in the sella suggesting partial empty sella. IMPRESSION: No acute  intracranial findings are seen.  Atrophy. Frothy density in frontal sinus suggests sinusitis. Electronically Signed   By: Ernie Avena M.D.   On: 05/24/2023 15:02    Procedures Procedures    Medications Ordered in ED Medications - No data to display  ED Course/ Medical Decision Making/ A&P                                 Medical Decision Making Amount and/or Complexity of Data Reviewed Labs: ordered. Radiology: ordered.     This patient presents to the ED for concern of dizziness, this involves an extensive number of treatment options, and is a complaint that carries with it  a high risk of complications and morbidity.  The differential diagnosis includes CVA, ICH, intracranial mass, critical dehydration, endocrine abnormality, sepsis/infection, electrolyte abnormality, cardiac arrhythmia.   Co morbidities that complicate the patient evaluation  anxiety, arthritis, thyroid disease    Lab Tests:  I Ordered, and personally interpreted labs.  The pertinent results include:   - BMP: Hyponatremia - CBC: No concern for anemia or leukocytosis   Imaging Studies ordered:  I ordered imaging studies including  -CT head: Assess for process contributing patient's symptoms I independently visualized and interpreted imaging  I agree with the radiologist interpretation   Cardiac Monitoring: / EKG:  The patient was maintained on a cardiac monitor.  I personally viewed and interpreted the cardiac monitored which showed an underlying rhythm of: Sinus rhythm without acute ST changes or arrhythmias   Problem List / ED Course / Critical interventions / Medication management  Patient presents emergency room concern for mild dizziness.  Patient also had mild nausea and mild headache accompanying her mild dizziness yesterday after walking in the hot weather that has since resolved. Denies vertigo. Physical exam and neuroexam unremarkable.  Patient is afebrile without tachycardia or  hypoxia.  BMP with very mild hyponatremia (134).  CBC without anemia or leukocytosis.  CT head without acute intracranial abnormalities.  EKG reassuring. Shared with patient that her symptom of mild dizziness and physical exam are not overly concerning for stroke; however, patient requested MRI. Once MRI arrived, patient denied wanting an MRI and states that she would rather have food and water. Provided patient with food and water. Patient stating that she is ready for discharge. Given that patient mentioned these symptoms intermittently happening over the past year, I recommended patient receive another echo to ensure that her trivial tricuspid regurgitation is not worsening. Patient endorsed understanding of plan. However, given the patient's symptoms started yesterday after walking in hot weather-I believe her symptoms are more due to dehydration at this time.  Lab work and vital signs not concerning for critical dehydration today.  Tolerating PO intake without problems. I have reviewed the patients home medicines and have made adjustments as needed Patient was given return precautions. Patient stable for discharge at this time.  Patient verbalized understanding of plan.  Ddx: these are considered less likely due to history of present illness and physical exam -CVA/ICH/intracranial mass: patient without neurodeficits; no history of seizure -Critical dehydration: BMP/CMP without concern -Endocrine abnormality -Sepsis/infection: SIRs criteria not met; patient afebrile without infectious symptoms -Electrolyte abnormality: BMP/CMP without concern  -Cardiac arrhythmia: EKG shows NSR without acute ST changes   Social Determinants of Health:  none       Final Clinical Impression(s) / ED Diagnoses Final diagnoses:  Dizziness    Rx / DC Orders ED Discharge Orders     None           Dorthy Cooler, [place] 05/24/23 1853    Vanetta Mulders, MD 05/25/23 1455

## 2023-05-24 NOTE — ED Triage Notes (Signed)
Pt c/o Nausea, dizziness and HA since last night. Referred by pcp for CT. Refusing EKG, had one done at PCP.Bilaterally equal, speech clear.

## 2023-05-24 NOTE — Discharge Instructions (Addendum)
It was a pleasure caring for you today.  Lab workup was reassuring.  Seek emergency care if experiencing any new or worsening symptoms.

## 2023-05-28 DIAGNOSIS — F4320 Adjustment disorder, unspecified: Secondary | ICD-10-CM | POA: Diagnosis not present

## 2023-06-03 ENCOUNTER — Other Ambulatory Visit: Payer: Self-pay | Admitting: Internal Medicine

## 2023-06-03 DIAGNOSIS — M25522 Pain in left elbow: Secondary | ICD-10-CM

## 2023-06-19 DIAGNOSIS — E441 Mild protein-calorie malnutrition: Secondary | ICD-10-CM | POA: Diagnosis not present

## 2023-06-19 DIAGNOSIS — Z0000 Encounter for general adult medical examination without abnormal findings: Secondary | ICD-10-CM | POA: Diagnosis not present

## 2023-06-19 DIAGNOSIS — E039 Hypothyroidism, unspecified: Secondary | ICD-10-CM | POA: Diagnosis not present

## 2023-06-19 DIAGNOSIS — E7800 Pure hypercholesterolemia, unspecified: Secondary | ICD-10-CM | POA: Diagnosis not present

## 2023-06-19 DIAGNOSIS — I700 Atherosclerosis of aorta: Secondary | ICD-10-CM | POA: Diagnosis not present

## 2023-06-19 DIAGNOSIS — J320 Chronic maxillary sinusitis: Secondary | ICD-10-CM | POA: Diagnosis not present

## 2023-06-19 DIAGNOSIS — E559 Vitamin D deficiency, unspecified: Secondary | ICD-10-CM | POA: Diagnosis not present

## 2023-06-19 DIAGNOSIS — M810 Age-related osteoporosis without current pathological fracture: Secondary | ICD-10-CM | POA: Diagnosis not present

## 2023-06-20 DIAGNOSIS — F4320 Adjustment disorder, unspecified: Secondary | ICD-10-CM | POA: Diagnosis not present

## 2023-06-26 ENCOUNTER — Ambulatory Visit
Admission: RE | Admit: 2023-06-26 | Discharge: 2023-06-26 | Disposition: A | Discharge status: Home / Self Care | Payer: Medicare HMO | Source: Ambulatory Visit | Attending: Internal Medicine | Admitting: Internal Medicine

## 2023-06-26 DIAGNOSIS — M25522 Pain in left elbow: Secondary | ICD-10-CM | POA: Diagnosis not present

## 2023-06-27 ENCOUNTER — Other Ambulatory Visit: Payer: Medicare HMO

## 2023-06-27 DIAGNOSIS — E559 Vitamin D deficiency, unspecified: Secondary | ICD-10-CM | POA: Diagnosis not present

## 2023-06-27 DIAGNOSIS — E7800 Pure hypercholesterolemia, unspecified: Secondary | ICD-10-CM | POA: Diagnosis not present

## 2023-07-04 ENCOUNTER — Other Ambulatory Visit: Payer: Medicare HMO

## 2023-07-11 DIAGNOSIS — F4320 Adjustment disorder, unspecified: Secondary | ICD-10-CM | POA: Diagnosis not present

## 2023-07-17 ENCOUNTER — Encounter: Payer: Self-pay | Admitting: Sports Medicine

## 2023-08-01 DIAGNOSIS — F4320 Adjustment disorder, unspecified: Secondary | ICD-10-CM | POA: Diagnosis not present

## 2023-08-06 ENCOUNTER — Other Ambulatory Visit (HOSPITAL_COMMUNITY): Payer: Self-pay

## 2023-08-06 ENCOUNTER — Ambulatory Visit: Payer: Medicare HMO | Admitting: Sports Medicine

## 2023-08-06 VITALS — BP 128/82 | Ht 62.5 in | Wt 107.0 lb

## 2023-08-06 DIAGNOSIS — M7712 Lateral epicondylitis, left elbow: Secondary | ICD-10-CM | POA: Diagnosis not present

## 2023-08-06 NOTE — Assessment & Plan Note (Signed)
I reassured her and suggested she could use some light ice massage as needed She may not need the exercises since she is not having much pain Change the position of her hand for texting and computer work Try to do more voice dictation  After reassuring her I offered her to return if needed for any concerns

## 2023-08-06 NOTE — Progress Notes (Signed)
Chief complaint left elbow swelling  Patient noticed some swelling along her left elbow She had been doing a lot of texting She had started a new exercise class at [employer] But she is not aware of anything that tended to stress the elbow except possibly the texting  She did not have a lot of pain  Her husband died of a Merkel cell cancer and she is very concerned that maybe she had a lipoma which is how his started  She spoke with her primary physician and let him have an ultrasound and an MRI of her left elbow  I reviewed these with her today and both of them showed very mild swelling around the common extensor tendon of the lateral left elbow  Currently her pain is not significant and she does not feel like she even needs the tennis elbow exercises that we had provided her  Physical exam Pleasant older female in no acute distress BP 128/82   Ht 5' 2.5" (1.588 m)   Wt 107 lb (48.5 kg)   BMI 19.26 kg/m   Left elbow shows full range of motion She has good strength On grip strength she gets no pain On resistance of finger extensor she is really getting no pain On resistance of wrist extension she is getting no pain She has slight point tenderness over the tip of the left epicondyle lat

## 2023-08-13 ENCOUNTER — Ambulatory Visit: Payer: Medicare HMO | Admitting: Sports Medicine

## 2023-08-19 DIAGNOSIS — L814 Other melanin hyperpigmentation: Secondary | ICD-10-CM | POA: Diagnosis not present

## 2023-08-19 DIAGNOSIS — L821 Other seborrheic keratosis: Secondary | ICD-10-CM | POA: Diagnosis not present

## 2023-08-19 DIAGNOSIS — D225 Melanocytic nevi of trunk: Secondary | ICD-10-CM | POA: Diagnosis not present

## 2023-08-21 DIAGNOSIS — F4320 Adjustment disorder, unspecified: Secondary | ICD-10-CM | POA: Diagnosis not present

## 2023-08-21 DIAGNOSIS — F4322 Adjustment disorder with anxiety: Secondary | ICD-10-CM | POA: Diagnosis not present

## 2023-09-14 DIAGNOSIS — F4320 Adjustment disorder, unspecified: Secondary | ICD-10-CM | POA: Diagnosis not present

## 2023-09-14 DIAGNOSIS — F4322 Adjustment disorder with anxiety: Secondary | ICD-10-CM | POA: Diagnosis not present

## 2023-09-22 ENCOUNTER — Encounter: Payer: Self-pay | Admitting: Sports Medicine

## 2023-09-26 ENCOUNTER — Other Ambulatory Visit: Payer: Self-pay | Admitting: Internal Medicine

## 2023-09-26 DIAGNOSIS — E890 Postprocedural hypothyroidism: Secondary | ICD-10-CM

## 2023-10-01 DIAGNOSIS — L538 Other specified erythematous conditions: Secondary | ICD-10-CM | POA: Diagnosis not present

## 2023-10-01 DIAGNOSIS — R208 Other disturbances of skin sensation: Secondary | ICD-10-CM | POA: Diagnosis not present

## 2023-10-01 DIAGNOSIS — Z789 Other specified health status: Secondary | ICD-10-CM | POA: Diagnosis not present

## 2023-10-01 DIAGNOSIS — L820 Inflamed seborrheic keratosis: Secondary | ICD-10-CM | POA: Diagnosis not present

## 2023-10-07 DIAGNOSIS — F4320 Adjustment disorder, unspecified: Secondary | ICD-10-CM | POA: Diagnosis not present

## 2023-10-07 DIAGNOSIS — F4322 Adjustment disorder with anxiety: Secondary | ICD-10-CM | POA: Diagnosis not present

## 2023-10-10 DIAGNOSIS — H25013 Cortical age-related cataract, bilateral: Secondary | ICD-10-CM | POA: Diagnosis not present

## 2023-10-10 DIAGNOSIS — H2513 Age-related nuclear cataract, bilateral: Secondary | ICD-10-CM | POA: Diagnosis not present

## 2023-10-18 DIAGNOSIS — R42 Dizziness and giddiness: Secondary | ICD-10-CM | POA: Diagnosis not present

## 2023-10-18 DIAGNOSIS — I10 Essential (primary) hypertension: Secondary | ICD-10-CM | POA: Diagnosis not present

## 2023-10-19 DIAGNOSIS — I10 Essential (primary) hypertension: Secondary | ICD-10-CM | POA: Diagnosis not present

## 2023-11-04 ENCOUNTER — Ambulatory Visit: Payer: Medicare HMO | Admitting: Internal Medicine

## 2023-11-04 ENCOUNTER — Encounter: Payer: Self-pay | Admitting: Internal Medicine

## 2023-11-04 VITALS — BP 120/70 | HR 55 | Ht 62.5 in | Wt 109.0 lb

## 2023-11-04 DIAGNOSIS — H6993 Unspecified Eustachian tube disorder, bilateral: Secondary | ICD-10-CM | POA: Diagnosis not present

## 2023-11-04 DIAGNOSIS — E890 Postprocedural hypothyroidism: Secondary | ICD-10-CM | POA: Diagnosis not present

## 2023-11-04 DIAGNOSIS — Z8639 Personal history of other endocrine, nutritional and metabolic disease: Secondary | ICD-10-CM | POA: Diagnosis not present

## 2023-11-04 DIAGNOSIS — E0500 Thyrotoxicosis with diffuse goiter without thyrotoxic crisis or storm: Secondary | ICD-10-CM

## 2023-11-04 DIAGNOSIS — J309 Allergic rhinitis, unspecified: Secondary | ICD-10-CM | POA: Diagnosis not present

## 2023-11-04 NOTE — Patient Instructions (Signed)
 Please continue Levothyroxine  75 mcg 5/7 days and 50 mcg 2/7 days.  Take the thyroid  hormone every day, with water , at least 30 minutes before breakfast, separated by at least 4 hours from: - acid reflux medications - calcium - iron - multivitamins  Please stop at the lab.  Please come back for a follow-up appointment in 1 year.

## 2023-11-04 NOTE — Progress Notes (Signed)
 Patient ID: Glenda Perez, female   DOB: Mar 22, 1941, 83 y.o.   MRN: [id number]   HPI  Glenda Perez is a 83 y.o.-year-old female, initially referred by her PCP, Dr. 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  Last visit 1 year ago. She prev. Saw Dr. Ubaldo Bitters at [facility] (notes reviewed). She prev. saw Dr. Hershal and Dr. Faythe.  Interim history: She went in a trip to [place] last spring.  She will go to [place] next 3 months. She has L elbow swelling - resolved. She will have cataract surgery.  Reviewed history: Pt. has been dx with hypothyroidism in 2011 after she had RAI ablation for Graves ds. >>  On levothyroxine .  In 2018 we moved levothyroxine  from bedtime to a.m.  TFTs remained controlled since then.  Pt is on levothyroxine  75 mcg 5/7 days and 50 mcg 2/7 days: - in am (when waking up to go to the restroom)  - fasting - at least 30 min from b'fast and coffee with half-and-half - + calcium citrate - 1h after LT4! >> now at night - no iron - no multivitamins - no PPIs - stopped Biotin   Reviewed her TFTs: Lab Results  Component Value Date   TSH 1.04 12/06/2022   TSH 1.28 03/22/2022   TSH 5.64 (H) 02/07/2022   TSH 0.30 (L) 12/13/2021   TSH 0.34 (L) 10/10/2021   TSH 1.03 10/06/2020   TSH 1.82 08/04/2019   TSH 0.45 05/20/2019   TSH 1.32 10/03/2018   TSH 2.00 10/04/2017   FREET4 1.17 12/06/2022   FREET4 1.06 03/22/2022   FREET4 0.85 02/07/2022   FREET4 1.38 12/13/2021   FREET4 1.14 10/10/2021   FREET4 0.91 10/06/2020   FREET4 1.16 08/04/2019   FREET4 1.37 05/20/2019   FREET4 0.86 10/03/2018   FREET4 1.05 07/25/2017  08/30/2016: TSH 0.704 01/11/2016: TSH 0.605  Her TSIs remain elevated: Lab Results  Component Value Date   TSI 227 (H) 12/06/2022   TSI 343 (H) 10/10/2021   TSI 244 (H) 10/06/2020   TSI 404 (H) 08/04/2019   TSI 386 (H) 10/03/2018   TSI >700 (H) 05/15/2017   TSI 487 (H) 05/12/2012    TSI 560 (H) 02/15/2012   TSI 603 (H) 01/16/2012   TSI 539 (H) 12/17/2011   Pt denies: - feeling nodules in neck - hoarseness - dysphagia - choking  No FH of thyroid  disease or thyroid  cancer. No h/o radiation tx to head or neck. On Turmeric - 2x a day. No recent steroids use.   She has a history of Graves' ophthalmopathy and sees ophthalmology-Dr. Charmayne.  This improved after she started to take 200 mcg selenium daily.  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    ROS: + see HPI  I reviewed pt's medications, allergies, PMH, social hx, family hx, and changes were documented in the history of present illness. Otherwise, unchanged from my initial visit note.  Past Medical History:  Diagnosis Date   Anxiety    clonezapam   Arthritis    Leaky heart valve    Osteopenia    Reactive hypoglycemia    Thyroid  disease    Past Surgical History:  Procedure Laterality Date   BIOPSY BREAST     COLONOSCOPY     OOPHORECTOMY     TOTAL HIP ARTHROPLASTY Left 10/02/2022   Procedure: TOTAL HIP ARTHROPLASTY ANTERIOR APPROACH;  Surgeon: Ernie Cough, MD;  Location: [facility];  Service: Orthopedics;  Laterality: Left;   UPPER GASTROINTESTINAL ENDOSCOPY     Social History   Social History   Marital status: Widowed    Spouse name: N/A   Number of children: 1   Occupational History   artist   Social History Main Topics   Smoking status: Never Smoker   Smokeless tobacco: Never Used   Alcohol use     3-5 Glasses of wine per week   Drug use: No   Current Outpatient Medications on File Prior to Visit  Medication Sig Dispense Refill   acetaminophen  (TYLENOL ) 500 MG tablet Take 1 tablet (500 mg total) by mouth every 6 (six) hours as needed. 30 tablet 0   Calcium Citrate (CITRACAL PO) Take 1 tablet by mouth 2 (two) times daily.     Cholecalciferol (VITAMIN D3) 50 MCG (2000 UT) capsule Take 2,000 Units by mouth daily.     clonazePAM  (KLONOPIN )  0.5 MG tablet Take 0.5 mg by mouth at bedtime.     cyanocobalamin  (VITAMIN B12) 1000 MCG tablet Take 1,000 mcg by mouth daily.     diphenhydramine -acetaminophen  (TYLENOL  PM) 25-500 MG TABS tablet Take 1 tablet by mouth at bedtime as needed (pain).     levothyroxine  (SYNTHROID ) 50 MCG tablet Take 1 tablet by mouth 2 days a week 26 tablet 1   levothyroxine  (SYNTHROID ) 75 MCG tablet TAKE 1 TABLET BY MOUTH 5 DAYS A WEEK 62 tablet 3   methocarbamol  (ROBAXIN ) 500 MG tablet Take 1 tablet (500 mg total) by mouth every 6 (six) hours as needed for muscle spasms (muscle pain). 40 tablet 1   polyethylene glycol (MIRALAX  / GLYCOLAX ) 17 g packet Take 17 g by mouth daily. 14 each 0   selenium 200 MCG TABS tablet Take 200 mcg by mouth daily.     traMADol  (ULTRAM ) 50 MG tablet Take 1-2 tablets (50-100 mg total) by mouth every 6 (six) hours as needed for moderate pain or severe pain. 20 tablet 0   Turmeric 500 MG TABS Take 500 mg by mouth daily.     No current facility-administered medications on file prior to visit.   Allergies  Allergen Reactions   Amoxicillin Rash    Has patient had a PCN reaction causing immediate rash, facial/tongue/throat swelling, SOB or lightheadedness with hypotension:No Has patient had a PCN reaction causing severe rash involving mucus membranes or skin necrosis:No Has patient had a PCN reaction that required hospitalization:No Has patient had a PCN reaction occurring within the last 10 years:Yes If all of the above answers are NO, then may proceed with Cephalosporin use.    Clavulanic Acid    Erythromycin Other (See Comments)    Unknown   Ibuprofen Other (See Comments)    Bloody stool   Penicillins Other (See Comments)    Unknown Tolerated Cephalosporin Date: 10/02/22.     Other Rash    All cillins Other reaction(s): tingling in fingers   Family History  Problem Relation Age of Onset   Colon cancer Neg Hx    Esophageal cancer Neg Hx    Pancreatic cancer Neg Hx     Rectal cancer Neg Hx    Stomach cancer Neg Hx    PE: BP 120/70   Pulse (!) 55   Ht 5' 2.5 (1.588 m)   Wt 109 lb (49.4 kg)   SpO2 93%   BMI 19.62 kg/m  Wt Readings from Last 3 Encounters:  11/04/23 109 lb (49.4 kg)  08/06/23 107 lb (48.5  kg)  05/24/23 110 lb 3.7 oz (50 kg)   Constitutional: normal weight, in NAD Eyes: PERRLA, EOMI, + B mild exophthalmos and stare ENT: no thyromegaly, no cervical lymphadenopathy Cardiovascular: RRR, No MRG Respiratory: CTA B Musculoskeletal: no deformities Skin: no rashes, + bluish discoloration of the first 3 fingers in bilateral hands (has a history of Raynaud's phenomenon) Neurological: no tremor with outstretched hands  ASSESSMENT: 1. Postablative Hypothyroidism - h/o Graves ds.  2. Graves ophthalmopathy (GO)  3.  History of Graves' disease  PLAN:  1. Patient with l longstanding post ablative hypothyroidism, on levothyroxine  therapy - latest thyroid  labs reviewed with pt. >> normal: Lab Results  Component Value Date   TSH 1.04 12/06/2022  - she continues on LT4 75 mcg 5/7 days and 50 mcg 2/7 days - pt feels good on this dose, without complaints. - we discussed about taking the thyroid  hormone every day, with water , >30 minutes before breakfast, separated by >4 hours from acid reflux medications, calcium, iron, multivitamins. Pt. is taking it correctly.  At last visit she was taking Ca citrate to close to levothyroxine  and I advised her to move the supplement at least 4 hours later.  She is not taking it at night. - will check thyroid  tests today: TSH and fT4 - If labs are abnormal, she will need to return for repeat TFTs in 1.5 months - OTW, I will see her back in a year  2. GO -Now mild -Improved after starting selenium 200 mcg daily -Denies double vision, blurry vision, eye pain, chemosis.  She did have some problems seeing at night but has cataract and she is preparing to have surgery. -She continues to see Dr. Charmayne  (ophthalmology) -Will check a TSI antibodies today  3.  History of Graves' disease -Latest TSI's are still elevated: Lab Results  Component Value Date   TSI 227 (H) 12/06/2022  -We will recheck these now -Will continue selenium for now.  Orders Placed This Encounter  Procedures   TSH   T4, free   Thyroid  stimulating immunoglobulin   Office Visit on 11/04/2023  Component Date Value Ref Range Status   TSH 11/04/2023 0.69  0.40 - 4.50 mIU/L Final   Free T4 11/04/2023 1.4  0.8 - 1.8 ng/dL Final   TSI 98/86/7974 164 (H)  <140 % baseline Final   Comment: . Thyroid  stimulating immunoglobulins (TSI) can engage the TSH receptors resulting in hyperthyroidism in Graves' disease patients. TSI levels can be useful in monitoring the clinical outcome of Graves' disease as well as assessing the potential for hyperthyroidism from maternal-fetal transfer. TSI results greater than or equal to (>=) 140% of the Reference Control are considered positive. SABRA NOTE: A serum TSH level greater than 350 micro-International Units/mL can interfere with the TSI bioassay and potentially give false positive results. . Patients who are pregnant and are suspected of having hyperthyroidism should have both TSI and human Chorionic Gonadotropin(hCG) tests measured. A serum hCG level greater than 40,625 mIU/mL can interfere with the TSI bioassay and may give false negative results. In these patients it is recommended that a second TSI be obtained when the hCG concentration falls below 40,625 mIU/mL (usually after approximately 20-weeks gestation)                          . . The analytical performance characteristics of this assay have been determined by [facility], [place], [place].  The modifications have not been  cleared or approved by the FDA.  This assay has been validated pursuant to the CLIA regulations and is used for clinical purposes. SABRA   TFTs are normal.  TSI  antibodies are elevated, but improved from before.  This is the lowest value that she had!  Lela Fendt, MD PhD [facility]

## 2023-11-06 ENCOUNTER — Encounter: Payer: Self-pay | Admitting: Internal Medicine

## 2023-11-07 ENCOUNTER — Encounter: Payer: Self-pay | Admitting: Internal Medicine

## 2023-11-07 LAB — THYROID STIMULATING IMMUNOGLOBULIN: TSI: 164 % baseline — ABNORMAL HIGH (ref <140)

## 2023-11-07 LAB — TSH: TSH: 0.69 m[IU]/L (ref 0.40–4.50)

## 2023-11-07 LAB — T4, FREE: Free T4: 1.4 ng/dL (ref 0.8–1.8)

## 2023-11-18 DIAGNOSIS — F4321 Adjustment disorder with depressed mood: Secondary | ICD-10-CM | POA: Diagnosis not present

## 2023-11-19 ENCOUNTER — Other Ambulatory Visit (HOSPITAL_BASED_OUTPATIENT_CLINIC_OR_DEPARTMENT_OTHER): Payer: Self-pay

## 2023-11-22 DIAGNOSIS — J101 Influenza due to other identified influenza virus with other respiratory manifestations: Secondary | ICD-10-CM | POA: Diagnosis not present

## 2023-11-22 DIAGNOSIS — B349 Viral infection, unspecified: Secondary | ICD-10-CM | POA: Diagnosis not present

## 2023-12-09 DIAGNOSIS — R059 Cough, unspecified: Secondary | ICD-10-CM | POA: Diagnosis not present

## 2023-12-17 DIAGNOSIS — Z1231 Encounter for screening mammogram for malignant neoplasm of breast: Secondary | ICD-10-CM | POA: Diagnosis not present

## 2023-12-24 DIAGNOSIS — H25012 Cortical age-related cataract, left eye: Secondary | ICD-10-CM | POA: Diagnosis not present

## 2023-12-24 DIAGNOSIS — H2512 Age-related nuclear cataract, left eye: Secondary | ICD-10-CM | POA: Diagnosis not present

## 2023-12-24 DIAGNOSIS — H25812 Combined forms of age-related cataract, left eye: Secondary | ICD-10-CM | POA: Diagnosis not present

## 2023-12-24 DIAGNOSIS — H25042 Posterior subcapsular polar age-related cataract, left eye: Secondary | ICD-10-CM | POA: Diagnosis not present

## 2023-12-24 DIAGNOSIS — Z961 Presence of intraocular lens: Secondary | ICD-10-CM | POA: Diagnosis not present

## 2024-01-01 DIAGNOSIS — F4321 Adjustment disorder with depressed mood: Secondary | ICD-10-CM | POA: Diagnosis not present

## 2024-01-01 DIAGNOSIS — J029 Acute pharyngitis, unspecified: Secondary | ICD-10-CM | POA: Diagnosis not present

## 2024-01-15 DIAGNOSIS — L821 Other seborrheic keratosis: Secondary | ICD-10-CM | POA: Diagnosis not present

## 2024-01-29 DIAGNOSIS — F4321 Adjustment disorder with depressed mood: Secondary | ICD-10-CM | POA: Diagnosis not present

## 2024-02-13 ENCOUNTER — Other Ambulatory Visit: Payer: Self-pay | Admitting: Internal Medicine

## 2024-02-13 DIAGNOSIS — E890 Postprocedural hypothyroidism: Secondary | ICD-10-CM

## 2024-03-11 DIAGNOSIS — F4321 Adjustment disorder with depressed mood: Secondary | ICD-10-CM | POA: Diagnosis not present

## 2024-03-17 ENCOUNTER — Emergency Department (HOSPITAL_BASED_OUTPATIENT_CLINIC_OR_DEPARTMENT_OTHER)

## 2024-03-17 ENCOUNTER — Emergency Department (HOSPITAL_BASED_OUTPATIENT_CLINIC_OR_DEPARTMENT_OTHER)
Admission: EM | Admit: 2024-03-17 | Discharge: 2024-03-17 | Disposition: A | Discharge status: Home / Self Care | Attending: Emergency Medicine | Admitting: Emergency Medicine

## 2024-03-17 ENCOUNTER — Other Ambulatory Visit: Payer: Self-pay

## 2024-03-17 ENCOUNTER — Emergency Department (HOSPITAL_COMMUNITY)

## 2024-03-17 ENCOUNTER — Encounter (HOSPITAL_BASED_OUTPATIENT_CLINIC_OR_DEPARTMENT_OTHER): Payer: Self-pay | Admitting: Emergency Medicine

## 2024-03-17 DIAGNOSIS — R42 Dizziness and giddiness: Secondary | ICD-10-CM | POA: Diagnosis present

## 2024-03-17 DIAGNOSIS — R2689 Other abnormalities of gait and mobility: Secondary | ICD-10-CM | POA: Diagnosis not present

## 2024-03-17 DIAGNOSIS — R29818 Other symptoms and signs involving the nervous system: Secondary | ICD-10-CM | POA: Diagnosis not present

## 2024-03-17 DIAGNOSIS — R110 Nausea: Secondary | ICD-10-CM | POA: Diagnosis not present

## 2024-03-17 DIAGNOSIS — R55 Syncope and collapse: Secondary | ICD-10-CM | POA: Diagnosis not present

## 2024-03-17 DIAGNOSIS — J341 Cyst and mucocele of nose and nasal sinus: Secondary | ICD-10-CM | POA: Diagnosis not present

## 2024-03-17 DIAGNOSIS — I517 Cardiomegaly: Secondary | ICD-10-CM | POA: Diagnosis not present

## 2024-03-17 DIAGNOSIS — Z79890 Hormone replacement therapy (postmenopausal): Secondary | ICD-10-CM | POA: Diagnosis not present

## 2024-03-17 DIAGNOSIS — E039 Hypothyroidism, unspecified: Secondary | ICD-10-CM | POA: Diagnosis not present

## 2024-03-17 DIAGNOSIS — I6782 Cerebral ischemia: Secondary | ICD-10-CM | POA: Diagnosis not present

## 2024-03-17 DIAGNOSIS — R918 Other nonspecific abnormal finding of lung field: Secondary | ICD-10-CM | POA: Diagnosis not present

## 2024-03-17 DIAGNOSIS — R9089 Other abnormal findings on diagnostic imaging of central nervous system: Secondary | ICD-10-CM | POA: Diagnosis not present

## 2024-03-17 DIAGNOSIS — R112 Nausea with vomiting, unspecified: Secondary | ICD-10-CM | POA: Diagnosis not present

## 2024-03-17 LAB — PRO BRAIN NATRIURETIC PEPTIDE: Pro Brain Natriuretic Peptide: 59.7 pg/mL (ref <300.0)

## 2024-03-17 LAB — CBC WITH DIFFERENTIAL/PLATELET
Abs Immature Granulocytes: 0.02 10*3/uL (ref 0.00–0.07)
Basophils Absolute: 0.1 10*3/uL (ref 0.0–0.1)
Basophils Relative: 1 %
Eosinophils Absolute: 0.1 10*3/uL (ref 0.0–0.5)
Eosinophils Relative: 1 %
HCT: 38.2 % (ref 36.0–46.0)
Hemoglobin: 13.2 g/dL (ref 12.0–15.0)
Immature Granulocytes: 0 %
Lymphocytes Relative: 47 %
Lymphs Abs: 3 10*3/uL (ref 0.7–4.0)
MCH: 33 pg (ref 26.0–34.0)
MCHC: 34.6 g/dL (ref 30.0–36.0)
MCV: 95.5 fL (ref 80.0–100.0)
Monocytes Absolute: 0.6 10*3/uL (ref 0.1–1.0)
Monocytes Relative: 9 %
Neutro Abs: 2.6 10*3/uL (ref 1.7–7.7)
Neutrophils Relative %: 42 %
Platelets: 208 10*3/uL (ref 150–400)
RBC: 4 MIL/uL (ref 3.87–5.11)
RDW: 12.6 % (ref 11.5–15.5)
WBC: 6.3 10*3/uL (ref 4.0–10.5)
nRBC: 0 % (ref 0.0–0.2)

## 2024-03-17 LAB — COMPREHENSIVE METABOLIC PANEL WITH GFR
ALT: 18 U/L (ref 0–44)
AST: 25 U/L (ref 15–41)
Albumin: 4.5 g/dL (ref 3.5–5.0)
Alkaline Phosphatase: 55 U/L (ref 38–126)
Anion gap: 15 (ref 5–15)
BUN: 13 mg/dL (ref 8–23)
CO2: 24 mmol/L (ref 22–32)
Calcium: 9.7 mg/dL (ref 8.9–10.3)
Chloride: 95 mmol/L — ABNORMAL LOW (ref 98–111)
Creatinine, Ser: 0.5 mg/dL (ref 0.44–1.00)
GFR, Estimated: >60 mL/min (ref >60)
Glucose, Bld: 101 mg/dL — ABNORMAL HIGH (ref 70–99)
Potassium: 3.6 mmol/L (ref 3.5–5.1)
Sodium: 134 mmol/L — ABNORMAL LOW (ref 135–145)
Total Bilirubin: 0.4 mg/dL (ref 0.0–1.2)
Total Protein: 7.5 g/dL (ref 6.5–8.1)

## 2024-03-17 LAB — TROPONIN T, HIGH SENSITIVITY
Troponin T High Sensitivity: <15 ng/L (ref <19)
Troponin T High Sensitivity: <15 ng/L (ref <19)

## 2024-03-17 LAB — D-DIMER, QUANTITATIVE: D-Dimer, Quant: <0.27 ug{FEU}/mL (ref 0.00–0.50)

## 2024-03-17 LAB — LIPASE, BLOOD: Lipase: 42 U/L (ref 11–51)

## 2024-03-17 MED ORDER — METOCLOPRAMIDE HCL 5 MG/ML IJ SOLN
10.0000 mg | Freq: Once | INTRAMUSCULAR | Status: AC
  Administered 2024-03-17: 10 mg via INTRAVENOUS
  Filled 2024-03-17: qty 2

## 2024-03-17 MED ORDER — MECLIZINE HCL 25 MG PO TABS
25.0000 mg | ORAL_TABLET | Freq: Once | ORAL | Status: AC
  Administered 2024-03-17: 25 mg via ORAL
  Filled 2024-03-17: qty 1

## 2024-03-17 MED ORDER — ACETAMINOPHEN 325 MG PO TABS
650.0000 mg | ORAL_TABLET | Freq: Once | ORAL | Status: AC
  Administered 2024-03-17: 650 mg via ORAL
  Filled 2024-03-17: qty 2

## 2024-03-17 MED ORDER — DIPHENHYDRAMINE HCL 50 MG/ML IJ SOLN
12.5000 mg | Freq: Once | INTRAMUSCULAR | Status: AC
  Administered 2024-03-17: 12.5 mg via INTRAVENOUS
  Filled 2024-03-17: qty 1

## 2024-03-17 MED ORDER — SODIUM CHLORIDE 0.9 % IV BOLUS: 1000.0000 mL | Freq: Once | INTRAVENOUS | Status: DC

## 2024-03-17 MED ORDER — MECLIZINE HCL 12.5 MG PO TABS: 12.5000 mg | ORAL_TABLET | Freq: Three times a day (TID) | ORAL | 0 refills | Status: AC | PRN

## 2024-03-17 NOTE — ED Notes (Signed)
 Pt arrived [facility]. Awaiting MRI in lobby.

## 2024-03-17 NOTE — ED Provider Notes (Signed)
 [place] [facility] AT [facility] Provider Note   CSN: [id number] Arrival date & time: 03/17/24  1032     History  Chief Complaint  Patient presents with   Nausea    Glenda Perez is a 83 y.o. female.  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  The patient states that she was working out earlier this morning when she developed sudden onset nausea.  She endorsed a brief episode of unsteadiness on her feet that progressed to room spinning dizziness.  She felt lightheaded and near syncopal.  She sat down and symptoms resolved.  She states that all of her symptoms resolved with the exception of persistent ongoing nausea.  She denies headache, vision changes, vision loss, facial droop, difficulty swallowing or speaking, focal numbness or weakness in any extremity.  She denies any fevers, chills or neck stiffness.  She denies any chest pain or shortness of breath.  Home Medications Prior to Admission medications   Medication Sig Start Date End Date Taking? Authorizing Provider  Calcium Citrate (CITRACAL PO) Take 1 tablet by mouth 2 (two) times daily.    [provider]  Cholecalciferol (VITAMIN D3) 50 MCG (2000 UT) capsule Take 2,000 Units by mouth daily.    [provider]  clonazePAM  (KLONOPIN ) 0.5 MG tablet Take 0.5 mg by mouth at bedtime.    [provider]  cyanocobalamin (VITAMIN B12) 1000 MCG tablet Take 1,000 mcg by mouth daily.    [provider]  levothyroxine  (SYNTHROID ) 50 MCG tablet TAKE 1 TABLET BY MOUTH 2 DAYS A WEEK 02/13/24   Emilie Harden, MD  levothyroxine  (SYNTHROID ) 75 MCG tablet TAKE 1 TABLET BY MOUTH 5 DAYS A WEEK 09/26/23   Emilie Harden, MD  selenium 200 MCG TABS tablet Take 200 mcg by mouth daily.    [provider]  Turmeric 500 MG TABS Take 500 mg by mouth daily.    [provider]      Allergies    Amoxicillin, Clavulanic acid, Erythromycin, Ibuprofen, Penicillins, and Other    Review of Systems   Review of Systems  Gastrointestinal:  Positive for nausea.  Neurological:  Positive for light-headedness.    Physical Exam Updated Vital Signs BP (!) 165/79 (BP Location: Right Arm)   Pulse 74   Temp 97.6 F (36.4 C) (Oral)   Resp 12   SpO2 99%  Physical Exam Vitals and nursing note reviewed.  Constitutional:      General: She is not in acute distress.    Appearance: She is well-developed.  HENT:     Head: Normocephalic and atraumatic.  Eyes:     Conjunctiva/sclera: Conjunctivae normal.  Cardiovascular:     Rate and Rhythm: Normal rate and regular rhythm.     Heart sounds: No murmur heard. Pulmonary:     Effort: Pulmonary effort is normal. No respiratory distress.     Breath sounds: Normal breath sounds.  Abdominal:     Palpations: Abdomen is soft.     Tenderness: There is no abdominal tenderness.  Musculoskeletal:        General: No swelling.     Cervical back: Neck supple.  Skin:    General: Skin is warm and dry.     Capillary Refill: Capillary refill takes less than 2 seconds.  Neurological:     Mental Status: She is alert.  Comments: MENTAL STATUS EXAM:    Orientation: Alert and oriented to person, place and time.  Memory: Cooperative, follows commands well.  Language: Speech is clear and language is normal.   CRANIAL NERVES:    CN 2 (Optic): Visual fields intact to confrontation.  CN 3,4,6 (EOM): Pupils equal and reactive to light. Full extraocular eye movement without nystagmus.  CN 5 (Trigeminal): Facial sensation is normal, no weakness of masticatory muscles.  CN 7 (Facial): No facial weakness or asymmetry.  CN 8 (Auditory): Auditory acuity grossly normal.  CN 9,10 (Glossophar): The uvula is midline, the palate elevates symmetrically.  CN 11 (spinal  access): Normal sternocleidomastoid and trapezius strength.  CN 12 (Hypoglossal): The tongue is midline. No atrophy or fasciculations.Aaron Aas   MOTOR:  Muscle Strength: 5/5RUE, 5/5LUE, 5/5RLE, 5/5LLE.   COORDINATION:   Intact finger-to-nose, no tremor.   SENSATION:   Intact to light touch all four extremities.   Psychiatric:        Mood and Affect: Mood normal.     ED Results / Procedures / Treatments   Labs (all labs ordered are listed, but only abnormal results are displayed) Labs Reviewed - No data to display  EKG None  Radiology No results found.  Procedures Procedures    Medications Ordered in ED Medications - No data to display  ED Course/ Medical Decision Making/ A&P                                 Medical Decision Making Amount and/or Complexity of Data Reviewed Labs: ordered. Radiology: ordered.  Risk Prescription drug management.    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  The patient states that she was working out earlier this morning when she developed sudden onset nausea.  She endorsed a brief episode of unsteadiness on her feet that progressed to room spinning dizziness.  She felt lightheaded and near syncopal.  She sat down and symptoms resolved.  She states that all of her symptoms resolved with the exception of persistent ongoing nausea.  She denies headache, vision changes, vision loss, facial droop, difficulty swallowing or speaking, focal numbness or weakness in any extremity.  She denies any fevers, chills or neck stiffness.  She denies any chest pain or shortness of breath.  Medical Decision Making:   Glenda Perez is a 83 y.o. female who presented to the ED today with a syncopal episode detailed above.     Complete initial physical exam performed, notably the patient   was neuro intact, lungs CTAB.    Reviewed and confirmed nursing documentation for past medical history, family history, social history.    Initial Assessment:   With the patient's presentation of syncope, most likely diagnosis is orthostatic hypotension vs vasovagal episode vs dehydration. Symtoms consistent with near syncope, less likely CVA. Other diagnoses were considered including (but not limited to) arrythmogenic syncope, valvular abnormality, PE, aortic dissection. These are considered less likely due to history of present illness and physical exam findings.   This is most consistent with an acute life/limb threatening illness complicated by underlying chronic conditions. In particular, concerning cardiac etiology, this is less likely to be the etiology given the lack of chest pain, lack of serious comorbidities including heart failure or CAD.   Additionally, patient's history appears more consistent with benign episode.  Initial Plan:  CT Head  Screening labs including CBC and Metabolic panel to evaluate for infectious or metabolic etiology of disease.  Urinalysis with reflex culture ordered to evaluate for UTI or relevant urologic/nephrologic pathology.  CXR to evaluate for structural/infectious intrathoracic pathology.  EKG to evaluate for cardiac pathology.  Objective evaluation as below reviewed after administration of IVF/Telemetry monitoring  Initial Study Results:   Laboratory  All laboratory results reviewed without evidence of clinically relevant pathology.   Exceptions include: none   EKG EKG was reviewed independently. Rate, rhythm, axis, intervals all examined and without medically relevant abnormality. ST segments without concerns for elevations.    Radiology:  All images reviewed independently. Agree with radiology report at this time.   DG Chest Portable 1 View Result Date: 03/17/2024 CLINICAL DATA:  Increased nausea and vomiting after exercising this morning.  EXAM: PORTABLE CHEST 1 VIEW COMPARISON:  None Available. FINDINGS: Borderline enlarged cardiac silhouette. The lungs are hyperexpanded and clear with normal vascularity. Small calcified granuloma or bone island at the right lateral lung base and small amount of costal cartilage calcification at the left lung base. IMPRESSION: 1. No acute abnormality. 2. Borderline cardiomegaly. 3. Hyperexpanded lungs, consistent with COPD. Electronically Signed   By: Catherin Closs M.D.   On: 03/17/2024 15:33     Final Assessment and Plan:   Pt feeling improved status post reglan  and benadryl . Tolerating PO intake. Neuro intact. Labs and EKG unremarkable. CT imaging pending at time of signout. Plan to reassess the patient after results of diagnostic testing, ultimate disposition pending results of reassessment. Singout given to Dr. Curatolo at 1600.    Final Clinical Impression(s) / ED Diagnoses Final diagnoses:  None    Rx / DC Orders ED Discharge Orders     None         Rosealee Concha, MD 03/17/24 [phone]

## 2024-03-17 NOTE — ED Provider Notes (Signed)
 Overall lab work images unremarkable.  Here with dizzy episode that started this morning.  She still has imbalance and dizziness on exam.  Will send for MRI to [facility] to rule out stroke.  She has had unremarkable orthostatics.  She has had stable vital signs.  Will give a dose of meclizine she prefers to go POV for MRI to rule out stroke.  She stable for this.  Dr. Kermit Ped accepts the patient in transfer.  Troponins have been normal.  D-dimer normal.  She had symptoms started while she was working out where she developed sudden nausea unsteadiness and dizziness.  She feels like symptoms have improved but still unsteady on her feet and dizzy.  This chart was dictated using voice recognition software.  Despite best efforts to proofread,  errors can occur which can change the documentation meaning.    Glenda Rue, DO 03/17/24 1728

## 2024-03-17 NOTE — ED Notes (Signed)
 Pt walked well a tad dizzy but nothing like before and wants to go home - Dr Kermit Ped advised

## 2024-03-17 NOTE — Discharge Instructions (Addendum)
 Your test results today were reassuring.  Drink fluids at home to stay hydrated.  A prescription for medication called meclizine was sent to your pharmacy.  Take this as needed for dizziness.  Return to the emergency department for any new or worsening symptoms of concern.

## 2024-03-17 NOTE — ED Notes (Signed)
 IV wrapped w/ co band. Pt en route to [facility] POV for MRI

## 2024-03-17 NOTE — ED Provider Notes (Signed)
 Patient presents in transfer from [facility].  She presented this morning with nausea and near syncope.  This was in the setting of exercise exertion.  Near syncopal symptoms resolved with rest.  She did have some residual nausea.  She was treated for nausea in the ED.  She had ongoing dizziness on reassessment.  She was given a dose of meclizine and transferred for MRI. Physical Exam  BP 138/69   Pulse 70   Temp 97.8 F (36.6 C) (Oral)   Resp 12   SpO2 98%   Physical Exam Vitals and nursing note reviewed.  Constitutional:      General: She is not in acute distress.    Appearance: Normal appearance. She is well-developed. She is not ill-appearing, toxic-appearing or diaphoretic.  HENT:     Head: Normocephalic and atraumatic.     Right Ear: External ear normal.     Left Ear: External ear normal.     Nose: Nose normal.     Mouth/Throat:     Mouth: Mucous membranes are moist.  Eyes:     Extraocular Movements: Extraocular movements intact.     Conjunctiva/sclera: Conjunctivae normal.  Cardiovascular:     Rate and Rhythm: Normal rate and regular rhythm.  Pulmonary:     Effort: Pulmonary effort is normal. No respiratory distress.  Abdominal:     General: There is no distension.     Palpations: Abdomen is soft.  Musculoskeletal:        General: No swelling. Normal range of motion.     Cervical back: Normal range of motion and neck supple.  Skin:    General: Skin is warm and dry.     Coloration: Skin is not jaundiced or pale.  Neurological:     General: No focal deficit present.     Mental Status: She is alert and oriented to person, place, and time.     Cranial Nerves: No cranial nerve deficit.     Sensory: No sensory deficit.     Motor: No weakness.     Coordination: Coordination normal.  Psychiatric:        Mood and Affect: Mood normal.        Behavior: Behavior normal.     Procedures  Procedures  ED Course / MDM    Medical Decision Making Amount and/or Complexity  of Data Reviewed Labs: ordered. Radiology: ordered.  Risk Prescription drug management.   Patient's MRI was negative for acute findings.  She does have some mild chronic changes in the small right mastoid effusion.  On assessment, patient is sitting on ED stretcher.  She is well-appearing.  She has no focal neurologic deficits.  She does endorse some ongoing nausea, which she attributes to not eating all day.  She was given food and drink.  She states that she was able to ambulate to the bathroom unassisted while here in the emergency department.  After p.o. challenge, patient felt better.  She was able to ambulate without difficulty.  She was discharged in stable condition.       Iva Mariner, MD 03/17/24 2139

## 2024-03-17 NOTE — ED Triage Notes (Signed)
 C/o increased nausea and vomiting after exercising this morning., Denies CP.

## 2024-03-24 DIAGNOSIS — I351 Nonrheumatic aortic (valve) insufficiency: Secondary | ICD-10-CM | POA: Diagnosis not present

## 2024-03-24 DIAGNOSIS — R55 Syncope and collapse: Secondary | ICD-10-CM | POA: Diagnosis not present

## 2024-03-24 DIAGNOSIS — E039 Hypothyroidism, unspecified: Secondary | ICD-10-CM | POA: Diagnosis not present

## 2024-03-30 ENCOUNTER — Other Ambulatory Visit (HOSPITAL_BASED_OUTPATIENT_CLINIC_OR_DEPARTMENT_OTHER): Payer: Self-pay

## 2024-04-02 DIAGNOSIS — F4321 Adjustment disorder with depressed mood: Secondary | ICD-10-CM | POA: Diagnosis not present

## 2024-04-23 DIAGNOSIS — F4321 Adjustment disorder with depressed mood: Secondary | ICD-10-CM | POA: Diagnosis not present

## 2024-04-28 ENCOUNTER — Emergency Department (HOSPITAL_COMMUNITY)
Admission: EM | Admit: 2024-04-28 | Discharge: 2024-04-28 | Disposition: A | Discharge status: Home / Self Care | Attending: Emergency Medicine | Admitting: Emergency Medicine

## 2024-04-28 ENCOUNTER — Other Ambulatory Visit: Payer: Self-pay

## 2024-04-28 ENCOUNTER — Emergency Department (HOSPITAL_COMMUNITY)

## 2024-04-28 ENCOUNTER — Encounter (HOSPITAL_COMMUNITY): Payer: Self-pay

## 2024-04-28 DIAGNOSIS — M47812 Spondylosis without myelopathy or radiculopathy, cervical region: Secondary | ICD-10-CM | POA: Diagnosis not present

## 2024-04-28 DIAGNOSIS — I10 Essential (primary) hypertension: Secondary | ICD-10-CM | POA: Diagnosis not present

## 2024-04-28 DIAGNOSIS — Y92410 Unspecified street and highway as the place of occurrence of the external cause: Secondary | ICD-10-CM | POA: Insufficient documentation

## 2024-04-28 DIAGNOSIS — M50221 Other cervical disc displacement at C4-C5 level: Secondary | ICD-10-CM | POA: Diagnosis not present

## 2024-04-28 DIAGNOSIS — R519 Headache, unspecified: Secondary | ICD-10-CM | POA: Diagnosis present

## 2024-04-28 DIAGNOSIS — R030 Elevated blood-pressure reading, without diagnosis of hypertension: Secondary | ICD-10-CM | POA: Insufficient documentation

## 2024-04-28 DIAGNOSIS — S0990XA Unspecified injury of head, initial encounter: Secondary | ICD-10-CM | POA: Diagnosis not present

## 2024-04-28 DIAGNOSIS — S0093XA Contusion of unspecified part of head, initial encounter: Secondary | ICD-10-CM | POA: Insufficient documentation

## 2024-04-28 DIAGNOSIS — M5021 Other cervical disc displacement,  high cervical region: Secondary | ICD-10-CM | POA: Diagnosis not present
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
## 2024-04-28 NOTE — Discharge Instructions (Signed)
 It was our pleasure to provide your ER care today - we hope that you feel better.  Take acetaminophen  as need.   Follow up with primary care doctor in 1-2 weeks if symptoms fail to improve/resolve. Your blood pressure is high today - follow up with primary care doctor in the next couple weeks.   Return to ER if worse, new symptoms, new/severe pain, trouble breathing, abdominal pain, severe headache, or other emergency concern.
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
## 2024-05-06 DIAGNOSIS — M4312 Spondylolisthesis, cervical region: Secondary | ICD-10-CM | POA: Diagnosis not present

## 2024-05-06 DIAGNOSIS — M25511 Pain in right shoulder: Secondary | ICD-10-CM | POA: Diagnosis not present

## 2024-05-06 DIAGNOSIS — G319 Degenerative disease of nervous system, unspecified: Secondary | ICD-10-CM | POA: Diagnosis not present

## 2024-05-06 DIAGNOSIS — I679 Cerebrovascular disease, unspecified: Secondary | ICD-10-CM | POA: Diagnosis not present

## 2024-05-14 DIAGNOSIS — F4321 Adjustment disorder with depressed mood: Secondary | ICD-10-CM | POA: Diagnosis not present

## 2024-05-25 ENCOUNTER — Other Ambulatory Visit: Payer: Self-pay | Admitting: Internal Medicine

## 2024-05-25 DIAGNOSIS — E890 Postprocedural hypothyroidism: Secondary | ICD-10-CM

## 2024-06-04 DIAGNOSIS — F4321 Adjustment disorder with depressed mood: Secondary | ICD-10-CM | POA: Diagnosis not present

## 2024-06-17 DIAGNOSIS — N812 Incomplete uterovaginal prolapse: Secondary | ICD-10-CM | POA: Diagnosis not present

## 2024-06-24 DIAGNOSIS — E7800 Pure hypercholesterolemia, unspecified: Secondary | ICD-10-CM | POA: Diagnosis not present

## 2024-06-24 DIAGNOSIS — E559 Vitamin D deficiency, unspecified: Secondary | ICD-10-CM | POA: Diagnosis not present

## 2024-06-24 DIAGNOSIS — Z0000 Encounter for general adult medical examination without abnormal findings: Secondary | ICD-10-CM | POA: Diagnosis not present

## 2024-06-24 DIAGNOSIS — Z23 Encounter for immunization: Secondary | ICD-10-CM | POA: Diagnosis not present

## 2024-06-24 DIAGNOSIS — M810 Age-related osteoporosis without current pathological fracture: Secondary | ICD-10-CM | POA: Diagnosis not present

## 2024-06-24 DIAGNOSIS — Z1331 Encounter for screening for depression: Secondary | ICD-10-CM | POA: Diagnosis not present

## 2024-06-24 DIAGNOSIS — F419 Anxiety disorder, unspecified: Secondary | ICD-10-CM | POA: Diagnosis not present

## 2024-06-24 DIAGNOSIS — E538 Deficiency of other specified B group vitamins: Secondary | ICD-10-CM | POA: Diagnosis not present

## 2024-06-24 DIAGNOSIS — E039 Hypothyroidism, unspecified: Secondary | ICD-10-CM | POA: Diagnosis not present

## 2024-07-09 ENCOUNTER — Ambulatory Visit: Admitting: Sports Medicine

## 2024-07-10 DIAGNOSIS — F4321 Adjustment disorder with depressed mood: Secondary | ICD-10-CM | POA: Diagnosis not present

## 2024-07-16 DIAGNOSIS — E871 Hypo-osmolality and hyponatremia: Secondary | ICD-10-CM | POA: Diagnosis not present

## 2024-07-16 DIAGNOSIS — H25011 Cortical age-related cataract, right eye: Secondary | ICD-10-CM | POA: Diagnosis not present

## 2024-07-16 DIAGNOSIS — H2511 Age-related nuclear cataract, right eye: Secondary | ICD-10-CM | POA: Diagnosis not present

## 2024-07-20 DIAGNOSIS — Z23 Encounter for immunization: Secondary | ICD-10-CM | POA: Diagnosis not present

## 2024-07-21 ENCOUNTER — Ambulatory Visit (INDEPENDENT_AMBULATORY_CARE_PROVIDER_SITE_OTHER): Admitting: Sports Medicine

## 2024-07-21 VITALS — BP 110/70 | Wt 106.0 lb

## 2024-07-21 DIAGNOSIS — M4106 Infantile idiopathic scoliosis, lumbar region: Secondary | ICD-10-CM | POA: Diagnosis not present

## 2024-07-21 NOTE — Assessment & Plan Note (Signed)
 Compared to earlier evaluation she has actually significantly corrected her pelvic rotation and has managed to level her shoulder position I suggested she keep up the yoga and walking program I gave her some stretches winging her left arm over her right shoulder which moves the spine back more to midline She will do these in 3 positions and work with her yoga instructor  I reassured her that her gait is much improved and I do not think she needs orthotics at this point

## 2024-07-21 NOTE — Progress Notes (Signed)
 CC: Evaluation of scoliosis and gait  Patient has had longstanding scoliosis She stays very active and fit Walking and her primary activity She has added more yoga and is trying to work on limiting the amount of change in her back position and not letting her scoliosis worsen  No real pain today but she would like evaluation of her gait and exercises to improve muscle balance around her scoliosis  Physical exam Pleasant thin female in no acute distress BP 110/70   Wt 106 lb (48.1 kg)   BMI 19.39 kg/m   She has a significant concave curve in the lumbar area concave to the left convex to the right No spasm or irritation noted She has worked to balance her shoulder position and pelvis position and they look level today from standing and only walking Leg lengths are equal when her pelvis is neutral  While the trunk shift to the right in the lumbar area is obvious she is compensated so well for this that I do not think it poses a significant issue

## 2024-08-16 ENCOUNTER — Other Ambulatory Visit: Payer: Self-pay | Admitting: Internal Medicine

## 2024-08-16 DIAGNOSIS — E890 Postprocedural hypothyroidism: Secondary | ICD-10-CM

## 2024-08-18 DIAGNOSIS — L538 Other specified erythematous conditions: Secondary | ICD-10-CM | POA: Diagnosis not present

## 2024-08-18 DIAGNOSIS — M7989 Other specified soft tissue disorders: Secondary | ICD-10-CM | POA: Diagnosis not present

## 2024-08-18 DIAGNOSIS — L821 Other seborrheic keratosis: Secondary | ICD-10-CM | POA: Diagnosis not present

## 2024-08-18 DIAGNOSIS — D1801 Hemangioma of skin and subcutaneous tissue: Secondary | ICD-10-CM | POA: Diagnosis not present

## 2024-08-18 DIAGNOSIS — L814 Other melanin hyperpigmentation: Secondary | ICD-10-CM | POA: Diagnosis not present

## 2024-08-18 DIAGNOSIS — Z789 Other specified health status: Secondary | ICD-10-CM | POA: Diagnosis not present

## 2024-08-18 DIAGNOSIS — L2989 Other pruritus: Secondary | ICD-10-CM | POA: Diagnosis not present

## 2024-08-18 DIAGNOSIS — L820 Inflamed seborrheic keratosis: Secondary | ICD-10-CM | POA: Diagnosis not present

## 2024-08-19 ENCOUNTER — Other Ambulatory Visit (HOSPITAL_COMMUNITY): Payer: Self-pay | Admitting: Internal Medicine

## 2024-08-19 ENCOUNTER — Ambulatory Visit (HOSPITAL_COMMUNITY)
Admission: RE | Admit: 2024-08-19 | Discharge: 2024-08-19 | Disposition: A | Discharge status: Home / Self Care | Source: Ambulatory Visit | Attending: Vascular Surgery | Admitting: Vascular Surgery

## 2024-08-19 DIAGNOSIS — R7989 Other specified abnormal findings of blood chemistry: Secondary | ICD-10-CM | POA: Diagnosis not present

## 2024-08-19 DIAGNOSIS — M7989 Other specified soft tissue disorders: Secondary | ICD-10-CM

## 2024-08-25 DIAGNOSIS — H25811 Combined forms of age-related cataract, right eye: Secondary | ICD-10-CM | POA: Diagnosis not present

## 2024-08-25 DIAGNOSIS — Z961 Presence of intraocular lens: Secondary | ICD-10-CM | POA: Diagnosis not present

## 2024-08-25 DIAGNOSIS — H2511 Age-related nuclear cataract, right eye: Secondary | ICD-10-CM | POA: Diagnosis not present

## 2024-08-25 DIAGNOSIS — H25011 Cortical age-related cataract, right eye: Secondary | ICD-10-CM | POA: Diagnosis not present

## 2024-09-03 DIAGNOSIS — F4321 Adjustment disorder with depressed mood: Secondary | ICD-10-CM | POA: Diagnosis not present

## 2024-09-14 ENCOUNTER — Other Ambulatory Visit: Payer: Self-pay

## 2024-09-14 ENCOUNTER — Emergency Department (HOSPITAL_BASED_OUTPATIENT_CLINIC_OR_DEPARTMENT_OTHER)

## 2024-09-14 ENCOUNTER — Encounter (HOSPITAL_BASED_OUTPATIENT_CLINIC_OR_DEPARTMENT_OTHER): Payer: Self-pay | Admitting: *Deleted

## 2024-09-14 ENCOUNTER — Emergency Department (HOSPITAL_BASED_OUTPATIENT_CLINIC_OR_DEPARTMENT_OTHER)
Admission: EM | Admit: 2024-09-14 | Discharge: 2024-09-14 | Disposition: A | Discharge status: Home / Self Care | Attending: Emergency Medicine | Admitting: Emergency Medicine

## 2024-09-14 DIAGNOSIS — R600 Localized edema: Secondary | ICD-10-CM | POA: Diagnosis not present

## 2024-09-14 DIAGNOSIS — Z043 Encounter for examination and observation following other accident: Secondary | ICD-10-CM | POA: Diagnosis not present

## 2024-09-14 DIAGNOSIS — S6292XA Unspecified fracture of left wrist and hand, initial encounter for closed fracture: Secondary | ICD-10-CM | POA: Diagnosis not present

## 2024-09-14 DIAGNOSIS — W108XXA Fall (on) (from) other stairs and steps, initial encounter: Secondary | ICD-10-CM | POA: Diagnosis not present

## 2024-09-14 DIAGNOSIS — S0993XA Unspecified injury of face, initial encounter: Secondary | ICD-10-CM | POA: Diagnosis not present

## 2024-09-14 DIAGNOSIS — M25461 Effusion, right knee: Secondary | ICD-10-CM | POA: Diagnosis not present

## 2024-09-14 DIAGNOSIS — S60511A Abrasion of right hand, initial encounter: Secondary | ICD-10-CM | POA: Insufficient documentation

## 2024-09-14 DIAGNOSIS — Z23 Encounter for immunization: Secondary | ICD-10-CM | POA: Diagnosis not present

## 2024-09-14 DIAGNOSIS — S81811A Laceration without foreign body, right lower leg, initial encounter: Secondary | ICD-10-CM | POA: Insufficient documentation

## 2024-09-14 DIAGNOSIS — S01511A Laceration without foreign body of lip, initial encounter: Secondary | ICD-10-CM | POA: Diagnosis not present

## 2024-09-14 DIAGNOSIS — S60512A Abrasion of left hand, initial encounter: Secondary | ICD-10-CM | POA: Insufficient documentation

## 2024-09-14 DIAGNOSIS — S0085XA Superficial foreign body of other part of head, initial encounter: Secondary | ICD-10-CM | POA: Diagnosis not present

## 2024-09-14 DIAGNOSIS — S8991XA Unspecified injury of right lower leg, initial encounter: Secondary | ICD-10-CM | POA: Diagnosis present

## 2024-09-14 DIAGNOSIS — R220 Localized swelling, mass and lump, head: Secondary | ICD-10-CM | POA: Diagnosis not present

## 2024-09-14 DIAGNOSIS — S81819A Laceration without foreign body, unspecified lower leg, initial encounter: Secondary | ICD-10-CM | POA: Diagnosis not present

## 2024-09-14 DIAGNOSIS — M19041 Primary osteoarthritis, right hand: Secondary | ICD-10-CM | POA: Diagnosis not present

## 2024-09-14 DIAGNOSIS — W19XXXA Unspecified fall, initial encounter: Secondary | ICD-10-CM

## 2024-09-14 MED ORDER — BACITRACIN ZINC 500 UNIT/GM EX OINT
TOPICAL_OINTMENT | Freq: Two times a day (BID) | CUTANEOUS | Status: DC
  Filled 2024-09-14: qty 28.35

## 2024-09-14 MED ORDER — TETANUS-DIPHTH-ACELL PERTUSSIS 5-2-15.5 LF-MCG/0.5 IM SUSP
0.5000 mL | Freq: Once | INTRAMUSCULAR | Status: AC
  Administered 2024-09-14: 0.5 mL via INTRAMUSCULAR
  Filled 2024-09-14: qty 0.5

## 2024-09-14 MED ORDER — LIDOCAINE-EPINEPHRINE 2 %-1:100000 IJ SOLN: 20.0000 mL | Freq: Once | INTRAMUSCULAR | Status: DC

## 2024-09-14 MED ORDER — LIDOCAINE-EPINEPHRINE (PF) 2 %-1:200000 IJ SOLN
20.0000 mL | Freq: Once | INTRAMUSCULAR | Status: AC
  Administered 2024-09-14: 20 mL
  Filled 2024-09-14: qty 20

## 2024-09-14 NOTE — ED Provider Notes (Signed)
 [place] EMERGENCY DEPARTMENT AT [facility] Provider Note   CSN: [id number] Arrival date & time: 09/14/24  9077     Patient presents with: Fall   Glenda Perez is a 83 y.o. female.   HPI   83 year old female presents emergency department with mechanical fall.  She got tripped and fell going up stairs.  She hit her chin, caught herself in both her hands and hit her shin on the step.  She has an obvious laceration to the left leg with multiple abrasions.  She has a small lip laceration with intact dentition.  There was no loss of consciousness, no syncope.  She is otherwise been in her usual state of health.  She does not take any blood thinning medicine.  Prior to Admission medications   Medication Sig Start Date End Date Taking? Authorizing Provider  Calcium Citrate (CITRACAL PO) Take 1 tablet by mouth 2 (two) times daily.    [provider]  Cholecalciferol (VITAMIN D3) 50 MCG (2000 UT) capsule Take 2,000 Units by mouth daily.    [provider]  clonazePAM  (KLONOPIN ) 0.5 MG tablet Take 0.5 mg by mouth at bedtime.    [provider]  cyanocobalamin  (VITAMIN B12) 1000 MCG tablet Take 1,000 mcg by mouth daily.    [provider]  levothyroxine  (SYNTHROID ) 50 MCG tablet TAKE 1 TABLET BY MOUTH 2 DAYS A WEEK 05/26/24   Trixie File, MD  levothyroxine  (SYNTHROID ) 75 MCG tablet TAKE 1 TABLET BY MOUTH 5 DAYS A WEEK 08/17/24   Trixie File, MD  meclizine  (ANTIVERT ) 12.5 MG tablet Take 1 tablet (12.5 mg total) by mouth 3 (three) times daily as needed for dizziness. 03/17/24   Melvenia Motto, MD  selenium 200 MCG TABS tablet Take 200 mcg by mouth daily.    [provider]  Turmeric 500 MG TABS Take 500 mg by mouth daily.    [provider]    Allergies: Amoxicillin, Clavulanic acid, Erythromycin, Ibuprofen, Penicillins, and Other    Review of Systems  Constitutional:  Negative for fever.  Respiratory:  Negative for  shortness of breath.   Cardiovascular:  Negative for chest pain.  Gastrointestinal:  Negative for abdominal pain.  Musculoskeletal:  Negative for back pain, neck pain and neck stiffness.  Skin:  Negative for rash.       Abrasions to chin, bilateral palms and laceration to right shin  Neurological:  Negative for weakness, numbness and headaches.    Updated Vital Signs BP (!) 178/67 (BP Location: Right Arm)   Pulse 62   Temp 98.4 F (36.9 C) (Oral)   Resp 16   SpO2 99%   Physical Exam Vitals and nursing note reviewed.  Constitutional:      General: She is not in acute distress.    Appearance: Normal appearance.  HENT:     Head: Normocephalic.     Mouth/Throat:     Mouth: Mucous membranes are moist.     Comments: Superficial abrasion to upper lip.  Small area then 1 cm linear laceration on the inside lower lip, most likely secondary to a tooth, minimal gaping, no bleeding.  Dentition intact, tongue unremarkable. Eyes:     Extraocular Movements: Extraocular movements intact.     Pupils: Pupils are equal, round, and reactive to light.  Cardiovascular:     Rate and Rhythm: Normal rate.  Pulmonary:     Effort: Pulmonary effort is normal. No respiratory distress.  Abdominal:     Palpations: Abdomen is soft.  Tenderness: There is no abdominal tenderness.  Musculoskeletal:     Cervical back: Neck supple. No rigidity.  Skin:    General: Skin is warm.     Comments: Superficial abrasions to lip, bilateral palms as well as approximately 6 cm curvy linear laceration to the right anterior shin, bleeding controlled  Neurological:     Mental Status: She is alert and oriented to person, place, and time. Mental status is at baseline.  Psychiatric:        Mood and Affect: Mood normal.     (all labs ordered are listed, but only abnormal results are displayed) Labs Reviewed - No data to display  EKG: None  Radiology: DG Tibia/Fibula Right Result Date: 09/14/2024 EXAM: 3 VIEW(S)  XRAY OF THE RIGHT TIBIA AND FIBULA 09/14/2024 11:20:33 AM COMPARISON: None available. CLINICAL HISTORY: 83 year old female. Fall. FINDINGS: BONES AND JOINTS: No acute fracture or dislocation identified about the right tibia and fibula. No focal osseous lesion. Small knee joint effusion. SOFT TISSUES: Anterior mid tibial level soft tissue injury and irregularity. Diffuse subcutaneous edema. IMPRESSION: 1. Soft tissue laceration at the anterior mid leg with diffuse subcutaneous edema. 2. Small knee joint effusion. 3. No acute fracture or dislocation identified about the right tibia and fibula. Electronically signed by: Helayne Hurst MD 09/14/2024 11:36 AM EST RP Workstation: [id number]   DG Hand Complete Left Result Date: 09/14/2024 EXAM: 3 OR MORE VIEW(S) XRAY OF THE LEFT HAND 09/14/2024 11:20:33 AM COMPARISON: None available. CLINICAL HISTORY: 83 year old female. Fall. FINDINGS: BONES AND JOINTS: Old ulnar styloid fracture. 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 No joint dislocation. SOFT TISSUES: The soft tissues are unremarkable. IMPRESSION: 1. No acute fracture or dislocation identified about the left hand. Electronically signed by: Helayne Hurst MD 09/14/2024 11:35 AM EST RP Workstation: [id number]   DG Hand Complete Right Result Date: 09/14/2024 EXAM: 3 OR MORE VIEW(S) XRAY OF THE RIGHT HAND 09/14/2024 11:20:33 AM COMPARISON: None available. CLINICAL HISTORY: 83 year old female. Fall. FINDINGS: BONES AND JOINTS: No acute fracture. No focal osseous lesion. No joint dislocation. Mild degenerative changes of the distal interphalangeal joints. SOFT TISSUES: The soft tissues are unremarkable. IMPRESSION: 1. No acute fracture or dislocation identified about the right hand. Electronically signed by: Helayne Hurst MD 09/14/2024 11:34 AM EST RP Workstation: [id number]   CT Maxillofacial Wo Contrast Result Date: 09/14/2024 EXAM: CT OF THE FACE WITHOUT CONTRAST 09/14/2024  11:23:45 AM TECHNIQUE: CT of the face was performed without the administration of intravenous contrast. Multiplanar reformatted images are provided for review. Automated exposure control, iterative reconstruction, and/or weight based adjustment of the mA/kV was utilized to reduce the radiation dose to as low as reasonably achievable. COMPARISON: Brain MRI 03/17/2024 and Head CT 04/28/2024. CLINICAL HISTORY: 83 year old female. Fall, Polytrauma, blunt. FINDINGS: FACIAL BONES: No acute facial fracture. No mandibular dislocation. No suspicious bone lesion. ORBITS: Globes are intact. No acute traumatic injury. No inflammatory change. SINUSES AND MASTOIDS: Paranasal sinuses, tympanic cavities and mastoids are well aerated. SOFT TISSUES: There is a small area of superficial soft tissue injury at the bottom lip in the midline on series 2 image 58. Trace posttraumatic gas is present there, and also a small 4 mm retained radiopaque foreign body. No nearby tooth fracture or avulsion is identified. Mild broad based soft tissue swelling is noted overlying the right symphysis of the mandible on series 6 image 54. No visible scalp soft tissue injury. Negative visible non-contrast thyroid , larynx, pharynx, parapharyngeal spaces, retropharyngeal  space, sublingual space, submandibular spaces, masticator and parotid spaces. VASCULATURE: Mild free edge calcified atherosclerosis at the skull base. BRAIN PARENCHYMA: Negative free edge visible non-contrast brain parenchyma. 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 IMPRESSION: 1. Superficial soft tissue injury at the midline bottom lip and overlying the right mandibular symphysis. Trace posttraumatic gas and a 4 mm retained radiopaque foreign body (series 2 image 58). 2. No associated face or tooth fracture identified. Electronically signed by: Helayne Hurst MD 09/14/2024 11:32 AM EST RP Workstation: [id number]      .Laceration Repair  Date/Time: 09/14/2024 2:05 PM  Performed by: Bari Roxie HERO, DO Authorized by: Bari Roxie HERO, DO   Consent:    Consent obtained:  Verbal   Risks discussed:  Infection, poor cosmetic result, poor wound healing and tendon damage Universal protocol:    Patient identity confirmed:  Verbally with patient Anesthesia:    Anesthesia method:  Local infiltration   Local anesthetic:  Lidocaine  1% WITH epi Laceration details:    Location:  Leg   Leg location:  R lower leg   Length (cm):  6 Exploration:    Hemostasis achieved with:  Direct pressure Treatment:    Area cleansed with:  Saline   Amount of cleaning:  Standard   Irrigation solution:  Sterile saline   Irrigation method:  Syringe   Debridement:  None Skin repair:    Repair method:  Sutures   Suture size:  4-0   Suture material:  Prolene   Suture technique:  Simple interrupted   Number of sutures:  10 Approximation:    Approximation:  Close Repair type:    Repair type:  Simple Post-procedure details:    Dressing:  Antibiotic ointment and non-adherent dressing   Procedure completion:  Tolerated    Medications Ordered in the ED  bacitracin  ointment ( Topical Given by Other 09/14/24 1354)  Tdap (ADACEL ) injection 0.5 mL (0.5 mLs Intramuscular Given 09/14/24 1126)  lidocaine -EPINEPHrine  (XYLOCAINE  W/EPI) 2 %-1:200000 (PF) injection 20 mL (20 mLs Infiltration Given by Other 09/14/24 1355)                                    Medical Decision Making Amount and/or Complexity of Data Reviewed Radiology: ordered.  Risk OTC drugs. Prescription drug management.   83 year old female presents emergency department after mechanical fall.  She fell forward going up steps.  No loss of consciousness or syncope.  Vitals are normal and stable here.  Not on any anticoagulation.  She has abrasion to the upper lip with a small laceration to the inside of the lower lip as well as abrasions to bilateral  palms and right shin.  Trauma imaging is negative for acute fracture.  There was mention of a possible retained foreign body in the right lower lip laceration.  I irrigated with saline and syringe, she got some gravel out of it, no other visible foreign body.  Shin laceration was sutured and repaired, tolerated well.  Tdap was updated.  Plan for outpatient follow-up and suture removal.  Patient at this time appears safe and stable for discharge and close outpatient follow up. Discharge plan and strict return to ED precautions discussed, patient verbalizes understanding and agreement.     Final diagnoses:  Fall, initial encounter  Laceration of right lower extremity, initial encounter    ED Discharge Orders     None  Bari Roxie HERO, DO 09/14/24 8578

## 2024-09-14 NOTE — ED Triage Notes (Signed)
 Pt was going down steps when her toe caught and she fell forward.  Pt has pain and bruising to her chin, abrasion to upper lip, abrasions to palms of both hands and deep laceration to right shin.  Pt denies LOC, she is not on any blood thinners.  Last tetanus shot unknown but she believes she is up to date.

## 2024-09-14 NOTE — ED Notes (Signed)
 DC paperwork given and verbally understood.

## 2024-09-14 NOTE — ED Notes (Signed)
 Wounds cleaned/wrapped.SABRASABRA

## 2024-09-14 NOTE — Discharge Instructions (Addendum)
 You have been seen and discharged from the emergency department.  Continue to put bacitracin  antibiotic ointment on abrasions for the next couple days.  You may let them heal to air.  I recommend salt water  gargle in the morning and at night to remove any debris/food from the lip laceration.  The Chin laceration sutures will need to be removed in 7 to 10 days.  This can be done by her primary doctor at one of our facilities.  Your tetanus was updated at this visit.  Follow-up with your primary provider for further evaluation and further care. Take home medications as prescribed. If you have any worsening symptoms or further concerns for your health please return to an emergency department for further evaluation.

## 2024-09-23 ENCOUNTER — Emergency Department (HOSPITAL_BASED_OUTPATIENT_CLINIC_OR_DEPARTMENT_OTHER)
Admission: EM | Admit: 2024-09-23 | Discharge: 2024-09-23 | Disposition: A | Discharge status: Home / Self Care | Source: Ambulatory Visit | Attending: Emergency Medicine | Admitting: Emergency Medicine

## 2024-09-23 ENCOUNTER — Other Ambulatory Visit: Payer: Self-pay

## 2024-09-23 DIAGNOSIS — S81811D Laceration without foreign body, right lower leg, subsequent encounter: Secondary | ICD-10-CM | POA: Diagnosis not present

## 2024-09-23 DIAGNOSIS — Z4802 Encounter for removal of sutures: Secondary | ICD-10-CM | POA: Insufficient documentation

## 2024-09-23 DIAGNOSIS — F4321 Adjustment disorder with depressed mood: Secondary | ICD-10-CM | POA: Diagnosis not present

## 2024-09-23 NOTE — Discharge Instructions (Signed)
 Please keep the dressing dry.  You may leave in place to up for 1 week.  You may apply antibiotic ointment to the dressing if desired.  After the wound is healed, continue moisturization, consider a product such as Mederma to minimize scarring.

## 2024-09-23 NOTE — ED Provider Notes (Signed)
 [place] EMERGENCY DEPARTMENT AT [facility] Provider Note   CSN: [id number] Arrival date & time: 09/23/24  1624     Patient presents with: Suture / Staple Removal   Glenda Perez is a 83 y.o. female.   Patient with history of right lower extremity laceration occurring on 09/14/24.  Patient went to her PCP today and had 9 out of 10 sutures removed.  They were unable to remove the final stitch and were concerned about dehiscence.  Patient reports normal wound healing, improvement in erythema, no purulent drainage.  No fevers.  Facial and mouth wounds healing well.       Prior to Admission medications   Medication Sig Start Date End Date Taking? Authorizing Provider  Calcium Citrate (CITRACAL PO) Take 1 tablet by mouth 2 (two) times daily.    [provider]  Cholecalciferol (VITAMIN D3) 50 MCG (2000 UT) capsule Take 2,000 Units by mouth daily.    [provider]  clonazePAM  (KLONOPIN ) 0.5 MG tablet Take 0.5 mg by mouth at bedtime.    [provider]  cyanocobalamin  (VITAMIN B12) 1000 MCG tablet Take 1,000 mcg by mouth daily.    [provider]  levothyroxine  (SYNTHROID ) 50 MCG tablet TAKE 1 TABLET BY MOUTH 2 DAYS A WEEK 05/26/24   Trixie File, MD  levothyroxine  (SYNTHROID ) 75 MCG tablet TAKE 1 TABLET BY MOUTH 5 DAYS A WEEK 08/17/24   Trixie File, MD  meclizine  (ANTIVERT ) 12.5 MG tablet Take 1 tablet (12.5 mg total) by mouth 3 (three) times daily as needed for dizziness. 03/17/24   Melvenia Motto, MD  selenium 200 MCG TABS tablet Take 200 mcg by mouth daily.    [provider]  Turmeric 500 MG TABS Take 500 mg by mouth daily.    [provider]    Allergies: Amoxicillin, Clavulanic acid, Erythromycin, Ibuprofen, Penicillins, and Other    Review of Systems  Updated Vital Signs BP (!) 163/72 (BP Location: Right Arm)   Pulse 62   Temp 97.6 F (36.4 C)   Resp 16   SpO2 100%   Physical Exam Vitals and  nursing note reviewed.  Constitutional:      Appearance: She is well-developed.  HENT:     Head: Normocephalic and atraumatic.  Eyes:     Conjunctiva/sclera: Conjunctivae normal.  Pulmonary:     Effort: No respiratory distress.  Musculoskeletal:     Cervical back: Normal range of motion and neck supple.  Skin:    General: Skin is warm and dry.     Comments: Right lower leg wound with 1 Prolene suture in place centrally, growing into the surrounding tissue.  I was able to remove this under magnification with an 11 blade.  Wound itself appears to be healing appropriately, no purulent drainage, minimal surrounding erythema without significant warmth.  Upon removal of the final suture, wound did not dehisce.  Mepitel dressing was placed.  RN to place bacitracin  and bandage.  Neurological:     Mental Status: She is alert.     (all labs ordered are listed, but only abnormal results are displayed) Labs Reviewed - No data to display  EKG: None  Radiology: No results found.   Suture Removal  Date/Time: 09/23/2024 5:52 PM  Performed by: Desiderio Chew, PA-C Authorized by: Desiderio Chew, PA-C   Consent:    Consent obtained:  Verbal   Consent given by:  Patient   Risks discussed:  Pain and wound separation   Alternatives discussed:  Delayed  treatment Universal protocol:    Patient identity confirmed:  Verbally with patient and provided demographic data Location:    Location:  Lower extremity   Lower extremity location:  Leg   Leg location:  R lower leg Procedure details:    Wound appearance:  No signs of infection, clean and pink   Number of sutures removed:  1 Post-procedure details:    Post-procedure assessment: Mepitel dressing.   Procedure completion:  Tolerated well, no immediate complications Comments:     Magnification used, 11 blade used due to suture being partially grown into surrounding tissue.    Medications Ordered in the ED - No data to display  ED  Course  Patient seen and examined.  Reviewed PCP note.  Labs/EKG: None ordered  Imaging: None ordered  Medications/Fluids: None ordered  Most recent vital signs reviewed and are as follows: BP (!) 163/72 (BP Location: Right Arm)   Pulse 62   Temp 97.6 F (36.4 C)   Resp 16   SpO2 100%   Initial impression: Encounter for suture removal  Home treatment plan: Discussed Mepitel instructions, consider Moderma after wound is closed.  Return instructions discussed with patient: Return with increasing redness, pain, swelling, signs of infection  Follow-up instructions discussed with patient: PCP as needed                                   Medical Decision Making  Patient here for suture removal, final suture removal performed without complication.  No dehiscence.  Mepitel dressing placed.  Wound does not entirely closed yet, discussed delaying suture removal, however patient asked for this to be removed today.  Will continue wound care at home.     Final diagnoses:  Visit for suture removal    ED Discharge Orders     None          Desiderio Chew, PA-C 09/23/24 1754    Towana Ozell BROCKS, MD 09/24/24 1019

## 2024-09-23 NOTE — ED Triage Notes (Signed)
 Needs stitch removal in R leg. 1 stitch.

## 2024-10-01 ENCOUNTER — Ambulatory Visit: Admitting: Sports Medicine

## 2024-10-01 VITALS — BP 136/66 | Ht 62.5 in | Wt 106.0 lb

## 2024-10-01 DIAGNOSIS — S81811D Laceration without foreign body, right lower leg, subsequent encounter: Secondary | ICD-10-CM | POA: Diagnosis not present

## 2024-10-01 DIAGNOSIS — S81811A Laceration without foreign body, right lower leg, initial encounter: Secondary | ICD-10-CM | POA: Insufficient documentation

## 2024-10-01 NOTE — Assessment & Plan Note (Signed)
 Keep using a non adherent dressing  Clean with tap water   Finish her antibiotic ointment and switch to vasoline  If any redflags suggesting infection come back ASAP  Reck in 1 month  This is painful so it affects her gain but overall gait is normal except for favoring the RT leg for pain

## 2024-10-01 NOTE — Progress Notes (Signed)
 Discussed the use of AI scribe software for clinical note transcription with the patient, who gave verbal consent to proceed.  History of Present Illness Glenda Perez is an 83 year old female who presents with gait changes following a fall.  Gait disturbance and lower extremity edema - Gait changes and increased awareness of her leg since falling down nine concrete steps on the Tuesday before Thanksgiving. - Leg swelling present; believes high salt intake worsens swelling. - No significant pain, only mild discomfort managed with occasional Tylenol . - No dizziness or palpitations before or after the fall. - Remains independent with shopping and meal preparation. - Able to participate in fitness classes, Zumba, yoga, and meditation, and care for others despite the fall.  Fall and associated injuries - Fall occurred due to sun glare while wearing sunglasses. - Sustained a leg laceration, palm abrasions, and minor facial injury above her lip. - Emergency room evaluation showed no fractures on imaging and normal cognitive testing. - Concrete shards removed from her mouth; received stitches and a tetanus shot. - Blood pressure was elevated in the emergency room.  Sleep disturbance - Recently tapered off clonazepam  over three months and is now off it. - Difficulty sleeping since discontinuing clonazepam .  Scoliosis and musculoskeletal health - Has scoliosis and follows specific exercises for management.  Medication effects and adverse reactions - Continues Synthroid  75 mg five days a week and 50 mg two days a week. - Stopped using topical Preparation H for swelling after it caused dizziness.  Physical Exam Thin, pleasant Female in NAD BP 136/66   Ht 5' 2.5 (1.588 m)   Wt 106 lb (48.1 kg)   BMI 19.08 kg/m   MUSCULOSKELETAL: Hip flexion strength is good bilaterally. Hip abduction strength is good on the left and right.  Quadriceps strength is very good bilaterally.  Hamstring  strength is good bilaterally.  Shoulder abduction strength is good bilaterally. Rotator cuff testing is excellent.  Sit to stand test is normal.  Gait shows right antalgia but is steady. Slight turnout of the right foot.  Balance is good with one foot balance bilaterally.  Mild paraspinal muscle spasm to RT QL  Pelvis is level.  Note abrasion on RT anterior shin is about 2 x 1 cms and healing with scab formation Mild redness of lower leg with non pitting edema No warmth, drainage or signs of infection  Mild non pitting edema of left lower leg stocking distribution

## 2024-10-13 ENCOUNTER — Ambulatory Visit: Admitting: Sports Medicine

## 2024-10-28 ENCOUNTER — Other Ambulatory Visit (HOSPITAL_COMMUNITY): Payer: Self-pay | Admitting: Internal Medicine

## 2024-10-28 DIAGNOSIS — I351 Nonrheumatic aortic (valve) insufficiency: Secondary | ICD-10-CM

## 2024-10-29 ENCOUNTER — Ambulatory Visit (HOSPITAL_COMMUNITY)
Admission: RE | Admit: 2024-10-29 | Discharge: 2024-10-29 | Disposition: A | Discharge status: Home / Self Care | Source: Ambulatory Visit | Attending: *Deleted | Admitting: *Deleted

## 2024-10-29 DIAGNOSIS — I080 Rheumatic disorders of both mitral and aortic valves: Secondary | ICD-10-CM | POA: Insufficient documentation

## 2024-10-29 DIAGNOSIS — I351 Nonrheumatic aortic (valve) insufficiency: Secondary | ICD-10-CM | POA: Diagnosis present

## 2024-10-29 LAB — ECHOCARDIOGRAM COMPLETE
Area-P 1/2: 2.9 cm2
S' Lateral: 2.8 cm

## 2024-11-02 ENCOUNTER — Telehealth: Payer: Self-pay | Admitting: Internal Medicine

## 2024-11-02 NOTE — Telephone Encounter (Signed)
 Patient called this afternoon,she saw her PCP recently and the labs came back with low thyroid  levels.She is asking if her levels could be checked prior to her visit and that appointment she has on 11/10/24 be moved up due [place].Please advise.

## 2024-11-03 ENCOUNTER — Ambulatory Visit: Admitting: Sports Medicine

## 2024-11-06 ENCOUNTER — Encounter: Payer: Self-pay | Admitting: Internal Medicine

## 2024-11-06 ENCOUNTER — Ambulatory Visit: Admitting: Internal Medicine

## 2024-11-06 VITALS — BP 120/60 | HR 73 | Ht 62.5 in | Wt 117.2 lb

## 2024-11-06 DIAGNOSIS — E890 Postprocedural hypothyroidism: Secondary | ICD-10-CM

## 2024-11-06 DIAGNOSIS — Z8639 Personal history of other endocrine, nutritional and metabolic disease: Secondary | ICD-10-CM | POA: Diagnosis not present

## 2024-11-06 DIAGNOSIS — H05839 Thyroid orbitopathy, unspecified orbit: Secondary | ICD-10-CM | POA: Diagnosis not present

## 2024-11-06 MED ORDER — LEVOTHYROXINE SODIUM 50 MCG PO TABS: ORAL_TABLET | ORAL | 2 refills | Status: AC

## 2024-11-06 NOTE — Patient Instructions (Addendum)
 Please continue Levothyroxine  75 mcg 5/7 days and 50 mcg 2/7 days.  Take the thyroid  hormone every day, with water , at least 30 minutes before breakfast, separated by at least 4 hours from: - acid reflux medications - calcium - iron - multivitamins  Please come back for labs in 1.5 months.  Please come back for a follow-up appointment in 1 year.

## 2024-11-06 NOTE — Progress Notes (Signed)
 Patient ID: Glenda Perez, female   DOB: 17-Oct-1941, 84 y.o.   MRN: [id number]   HPI  Glenda Perez is a 84 y.o.-year-old female, initially referred by her PCP, Dr. 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  Last visit 1 year ago. She prev. Saw Dr. Ubaldo Bitters at [facility] (notes reviewed). She prev.  Also saw Dr. Hershal and Dr. Faythe.  Interim history: She continues to travel.  She moved to this appointment earlier as she will be traveling to [place] for a meditation retreat. She then leaves for [place]. In 08/2024, she had a fall going up stairs: She had lacerations of her left shin, chin, palms. She still has some swelling in her legs.  This does not stop her from being very active. She does yoga, zumba, goes to the gym. She had a recent 2D Echo -reportedly unchanged.  Reviewed history: Pt. has been dx with hypothyroidism in 2011 after she had RAI ablation for Graves ds. >>  On levothyroxine .  In 2018 we moved levothyroxine  from bedtime to a.m.  TFTs remained controlled since then.  Pt is on levothyroxine  75 mcg 5/7 days and 50 mcg 2/7 days: - in am (when waking up to go to the restroom)  - fasting - at least 30 min from b'fast and coffee with half-and-half - + calcium citrate - 1h after LT4! >>  Moved later - no iron - no multivitamins - no PPIs - stopped Biotin   Reviewed her TFTs: 10/28/2024: TSH 4.64 (0.34-4.5) Lab Results  Component Value Date   TSH 0.69 11/04/2023   TSH 1.04 12/06/2022   TSH 1.28 03/22/2022   TSH 5.64 (H) 02/07/2022   TSH 0.30 (L) 12/13/2021   TSH 0.34 (L) 10/10/2021   TSH 1.03 10/06/2020   TSH 1.82 08/04/2019   TSH 0.45 05/20/2019   TSH 1.32 10/03/2018   FREET4 1.4 11/04/2023   FREET4 1.17 12/06/2022   FREET4 1.06 03/22/2022   FREET4 0.85 02/07/2022   FREET4 1.38 12/13/2021   FREET4 1.14 10/10/2021   FREET4 0.91 10/06/2020   FREET4 1.16 08/04/2019   FREET4 1.37 05/20/2019    FREET4 0.86 10/03/2018  08/30/2016: TSH 0.704 01/11/2016: TSH 0.605  Her TSIs remain elevated: Lab Results  Component Value Date   TSI 164 (H) 11/04/2023   TSI 227 (H) 12/06/2022   TSI 343 (H) 10/10/2021   TSI 244 (H) 10/06/2020   TSI 404 (H) 08/04/2019   TSI 386 (H) 10/03/2018   TSI >700 (H) 05/15/2017   TSI 487 (H) 05/12/2012   TSI 560 (H) 02/15/2012   TSI 603 (H) 01/16/2012   Pt denies: - feeling nodules in neck - hoarseness - dysphagia - choking  No FH of thyroid  disease or thyroid  cancer. No h/o radiation tx to head or neck. On Turmeric - 2x a day. No recent steroids use.   She has a history of Graves' ophthalmopathy and sees ophthalmology-Dr. Charmayne.  This improved after she started to take 200 mcg selenium daily.  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    ROS: + see HPI  I reviewed pt's medications, allergies, PMH, social hx, family hx, and changes were documented in the history of present illness. Otherwise, unchanged from my initial visit note.  Past Medical History:  Diagnosis Date   Anxiety    clonezapam   Arthritis    Leaky heart valve    Osteopenia  Reactive hypoglycemia    Thyroid  disease    Past Surgical History:  Procedure Laterality Date   BIOPSY BREAST     COLONOSCOPY     OOPHORECTOMY     TOTAL HIP ARTHROPLASTY Left 10/02/2022   Procedure: TOTAL HIP ARTHROPLASTY ANTERIOR APPROACH;  Surgeon: Ernie Cough, MD;  Location: [facility];  Service: Orthopedics;  Laterality: Left;   UPPER GASTROINTESTINAL ENDOSCOPY     Social History   Social History   Marital status: Widowed    Spouse name: N/A   Number of children: 1   Occupational History   artist   Social History Main Topics   Smoking status: Never Smoker   Smokeless tobacco: Never Used   Alcohol use     3-5 Glasses of wine per week   Drug use: No   Current Outpatient Medications on File Prior to Visit  Medication Sig Dispense  Refill   Calcium Citrate (CITRACAL PO) Take 1 tablet by mouth 2 (two) times daily.     Cholecalciferol (VITAMIN D3) 50 MCG (2000 UT) capsule Take 2,000 Units by mouth daily.     clonazePAM  (KLONOPIN ) 0.5 MG tablet Take 0.5 mg by mouth at bedtime.     cyanocobalamin  (VITAMIN B12) 1000 MCG tablet Take 1,000 mcg by mouth daily.     levothyroxine  (SYNTHROID ) 50 MCG tablet TAKE 1 TABLET BY MOUTH 2 DAYS A WEEK 26 tablet 2   levothyroxine  (SYNTHROID ) 75 MCG tablet TAKE 1 TABLET BY MOUTH 5 DAYS A WEEK 62 tablet 1   meclizine  (ANTIVERT ) 12.5 MG tablet Take 1 tablet (12.5 mg total) by mouth 3 (three) times daily as needed for dizziness. 30 tablet 0   selenium 200 MCG TABS tablet Take 200 mcg by mouth daily.     Turmeric 500 MG TABS Take 500 mg by mouth daily.     No current facility-administered medications on file prior to visit.   Allergies  Allergen Reactions   Amoxicillin Rash    Has patient had a PCN reaction causing immediate rash, facial/tongue/throat swelling, SOB or lightheadedness with hypotension:No Has patient had a PCN reaction causing severe rash involving mucus membranes or skin necrosis:No Has patient had a PCN reaction that required hospitalization:No Has patient had a PCN reaction occurring within the last 10 years:Yes If all of the above answers are NO, then may proceed with Cephalosporin use.    Clavulanic Acid    Erythromycin Other (See Comments)    Unknown   Ibuprofen Other (See Comments)    Bloody stool   Penicillins Other (See Comments)    Unknown Tolerated Cephalosporin Date: 10/02/22.     Other Rash    All cillins Other reaction(s): tingling in fingers   Family History  Problem Relation Age of Onset   Colon cancer Neg Hx    Esophageal cancer Neg Hx    Pancreatic cancer Neg Hx    Rectal cancer Neg Hx    Stomach cancer Neg Hx    PE: BP 120/60   Pulse 73   Ht 5' 2.5 (1.588 m)   Wt 117 lb 3.2 oz (53.2 kg)   SpO2 96%   BMI 21.09 kg/m  Wt Readings from  Last 10 Encounters:  11/06/24 117 lb 3.2 oz (53.2 kg)  10/01/24 106 lb (48.1 kg)  07/21/24 106 lb (48.1 kg)  04/28/24 107 lb (48.5 kg)  11/04/23 109 lb (49.4 kg)  08/06/23 107 lb (48.5 kg)  05/24/23 110 lb 3.7 oz (50 kg)  11/02/22 107 lb 9.6  oz (48.8 kg)  10/02/22 107 lb (48.5 kg)  09/19/22 107 lb (48.5 kg)   Constitutional: normal weight, in NAD Eyes: PERRLA, EOMI, + B mild exophthalmos and stare ENT: no thyromegaly, no cervical lymphadenopathy Cardiovascular: RRR, No MRG, + B pitting edema Respiratory: CTA B Musculoskeletal: no deformities Skin: no rashes, + bluish discoloration of the first 3 fingers in bilateral hands (has a history of Raynaud's phenomenon) Neurological: no tremor with outstretched hands  ASSESSMENT: 1. Postablative Hypothyroidism - h/o Graves ds.  2. Graves ophthalmopathy (GO)  3.  History of Graves' disease  PLAN:  1. Patient with longstanding postablative hypothyroidism, on levothyroxine  therapy - latest thyroid  labs reviewed with pt. >> normal at last visit, in 10/2023: Lab Results  Component Value Date   TSH 0.69 11/04/2023  - However, she had another TSH obtained 10/28/2024 by PCP and this was slightly elevated, at 4.64 (0.34-4.5) - she continues on LT4 75 mcg 5/7 days alternating with 50 mcg dose 7 days  - pt feels good on this dose and has a lot of energy.  Her only complaint is bilateral leg swelling.  She was wondering if this was related to her thyroid  but we discussed that her TSH, even though it was slightly higher than target, it was still at goal for her age.  I did not recommend a change in dose right now, but I would like to have her back for labs for a recheck and we will see at that time if she needs to increase the dose.  For now, I refilled her 50 mcg dose. -  she is taking thyroid  hormone every day, with water , >30 minutes before breakfast, separated by >4 hours from acid reflux medications, calcium, iron, multivitamins. Pt. is taking it  correctly. - will check the following thyroid  tests in 1.5 months: TSH and fT4  - Otherwise, we will see her back in a year  2. GO - now mild - Improved after starting selenium 200 mg daily - she has no active signs of Graves' ophthalmopathy: No blurry vision, double vision, eye pain, chemosis - She continues to see Dr. Charmayne with ophthalmology - We will recheck her TSI's today  3.  History of Graves' disease - Latest TSI level was still elevated but the best she had in a long time: Lab Results  Component Value Date   TSI 164 (H) 11/04/2023  - We will continue selenium for now - Will recheck her TSIs today  Orders Placed This Encounter  Procedures   TSH   T4, free   Thyroid  stimulating immunoglobulin   After the results come back in 1.5 mo >> needs refills for 1 year.  Lela Fendt, MD PhD [facility]

## 2024-11-09 ENCOUNTER — Ambulatory Visit: Payer: Medicare HMO | Admitting: Internal Medicine

## 2024-11-10 ENCOUNTER — Ambulatory Visit: Admitting: Internal Medicine

## 2024-12-25 ENCOUNTER — Other Ambulatory Visit

## 2025-11-03 ENCOUNTER — Ambulatory Visit: Admitting: Internal Medicine
